# Patient Record
Sex: Female | Born: 1935 | Race: White | Hispanic: No | Smoking: Former smoker
Health system: Southern US, Community
[De-identification: ages and names within clinical notes are randomized; demographics above are authoritative.]

## PROBLEM LIST (undated history)

## (undated) DIAGNOSIS — I2510 Atherosclerotic heart disease of native coronary artery without angina pectoris: Secondary | ICD-10-CM

## (undated) DIAGNOSIS — E7800 Pure hypercholesterolemia, unspecified: Secondary | ICD-10-CM

## (undated) DIAGNOSIS — N189 Chronic kidney disease, unspecified: Secondary | ICD-10-CM

## (undated) DIAGNOSIS — M1990 Unspecified osteoarthritis, unspecified site: Secondary | ICD-10-CM

## (undated) DIAGNOSIS — I10 Essential (primary) hypertension: Secondary | ICD-10-CM

## (undated) DIAGNOSIS — D1779 Benign lipomatous neoplasm of other sites: Secondary | ICD-10-CM

## (undated) HISTORY — PX: ADRENALECTOMY: SHX876

## (undated) HISTORY — DX: Essential (primary) hypertension: I10

## (undated) HISTORY — PX: CYSTECTOMY: SUR359

## (undated) HISTORY — PX: TONSILLECTOMY: SHX5217

## (undated) HISTORY — DX: Unspecified osteoarthritis, unspecified site: M19.90

## (undated) HISTORY — DX: Pure hypercholesterolemia, unspecified: E78.00

## (undated) HISTORY — PX: BACK SURGERY: SHX140

## (undated) HISTORY — PX: TOTAL ABDOMINAL HYSTERECTOMY: SHX209

## (undated) HISTORY — PX: CATARACT EXTRACTION: SUR2

## (undated) HISTORY — PX: HEMORROIDECTOMY: SUR656

---

## 1998-12-31 ENCOUNTER — Encounter: Payer: Self-pay | Admitting: Specialist

## 1999-01-01 ENCOUNTER — Encounter: Payer: Self-pay | Admitting: Specialist

## 1999-01-01 ENCOUNTER — Observation Stay (HOSPITAL_COMMUNITY): Admission: RE | Admit: 1999-01-01 | Discharge: 1999-01-02 | Payer: Self-pay | Admitting: Specialist

## 1999-07-08 ENCOUNTER — Emergency Department (HOSPITAL_COMMUNITY): Admission: EM | Admit: 1999-07-08 | Discharge: 1999-07-08 | Payer: Self-pay | Admitting: *Deleted

## 2007-04-27 ENCOUNTER — Ambulatory Visit: Payer: Self-pay | Admitting: Vascular Surgery

## 2008-12-26 HISTORY — PX: CARDIOVASCULAR STRESS TEST: SHX262

## 2008-12-30 ENCOUNTER — Encounter: Admission: RE | Admit: 2008-12-30 | Discharge: 2008-12-30 | Payer: Self-pay | Admitting: General Surgery

## 2009-01-02 ENCOUNTER — Inpatient Hospital Stay (HOSPITAL_BASED_OUTPATIENT_CLINIC_OR_DEPARTMENT_OTHER): Admission: RE | Admit: 2009-01-02 | Discharge: 2009-01-02 | Payer: Self-pay | Admitting: Cardiovascular Disease

## 2009-01-02 HISTORY — PX: CARDIAC CATHETERIZATION: SHX172

## 2009-01-19 ENCOUNTER — Encounter: Payer: Self-pay | Admitting: Cardiology

## 2009-01-19 ENCOUNTER — Ambulatory Visit: Payer: Self-pay | Admitting: Vascular Surgery

## 2009-01-19 ENCOUNTER — Ambulatory Visit (HOSPITAL_COMMUNITY): Admission: RE | Admit: 2009-01-19 | Discharge: 2009-01-19 | Payer: Self-pay | Admitting: Cardiology

## 2009-02-10 ENCOUNTER — Encounter (INDEPENDENT_AMBULATORY_CARE_PROVIDER_SITE_OTHER): Payer: Self-pay | Admitting: General Surgery

## 2009-02-10 ENCOUNTER — Inpatient Hospital Stay (HOSPITAL_COMMUNITY): Admission: RE | Admit: 2009-02-10 | Discharge: 2009-02-16 | Payer: Self-pay | Admitting: General Surgery

## 2009-11-26 ENCOUNTER — Encounter (INDEPENDENT_AMBULATORY_CARE_PROVIDER_SITE_OTHER): Payer: Self-pay | Admitting: *Deleted

## 2009-11-30 ENCOUNTER — Encounter (INDEPENDENT_AMBULATORY_CARE_PROVIDER_SITE_OTHER): Payer: Self-pay | Admitting: *Deleted

## 2009-12-02 ENCOUNTER — Ambulatory Visit: Payer: Self-pay | Admitting: Internal Medicine

## 2009-12-14 ENCOUNTER — Ambulatory Visit: Payer: Self-pay | Admitting: Internal Medicine

## 2009-12-15 ENCOUNTER — Encounter: Payer: Self-pay | Admitting: Internal Medicine

## 2010-07-02 IMAGING — CT CT PELVIS W/ CM
2 of 5 series · 16 of 46 positions shown, 18 images · IV contrast (READICAT/WATER & [ID] OMNI 300)
Comparison: Abdominal pelvic CT done at [HOSPITAL] 12/24/2008.

CT ABDOMEN

CLINICAL DATA: Right upper quadrant abdominal pain for several
months with constipation.  Follow-up mass.

CT ABDOMEN AND PELVIS WITH CONTRAST
TECHNIQUE: Multidetector CT imaging of the abdomen and pelvis was
performed using the standard protocol following bolus
administration of intravenous contrast.
Contrast: 100 ml Qmnipaque-WZZ intravenously.  Oral contrast was
given.

[Series 3: routine abdomen · axial · 0.70mm/px · z∈[-483,-103]mm · 13 of 86 slices shown, 15 images]
[im 6/86  soft-tissue]
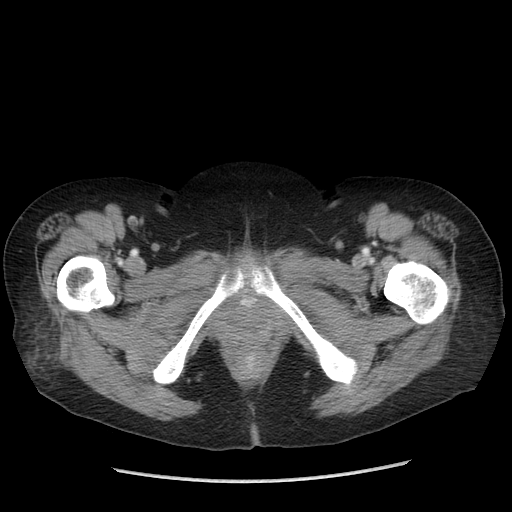
[im 6/86  bone]
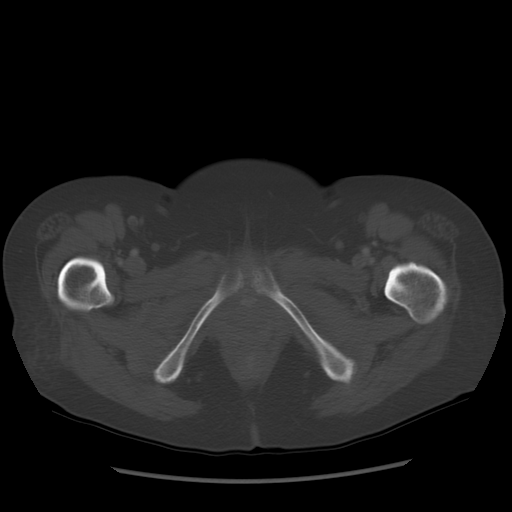
[im 11/86  soft-tissue]
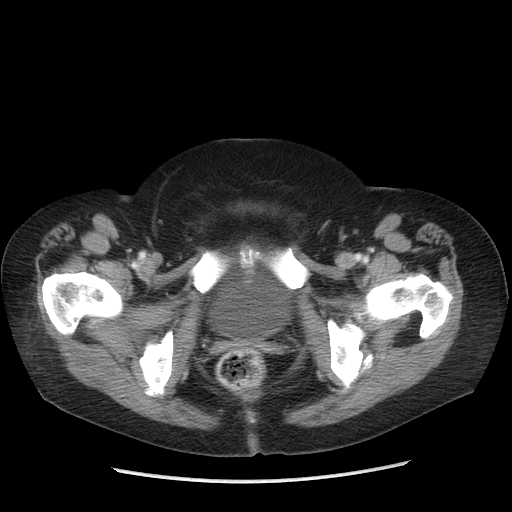
[im 21/86  soft-tissue]
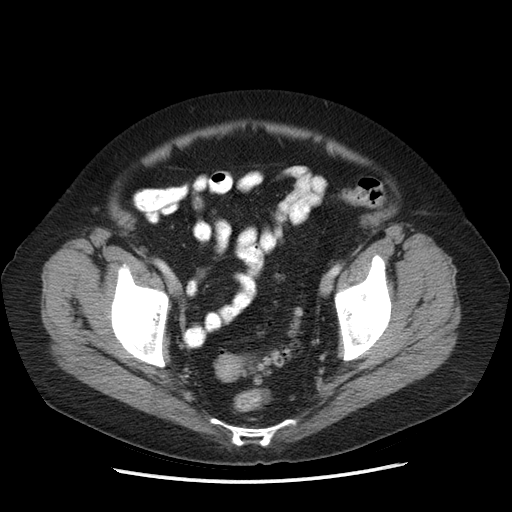
[im 26/86  soft-tissue]
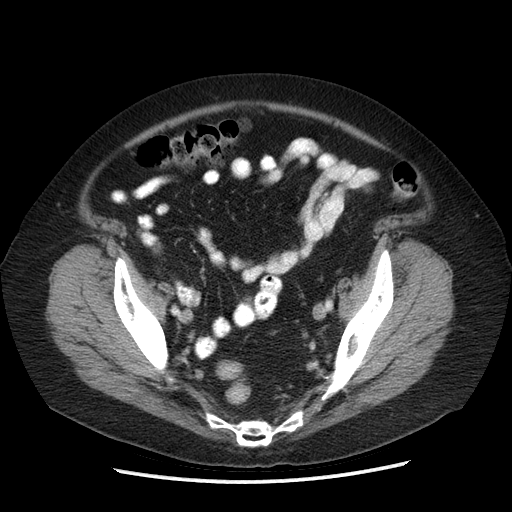
[im 31/86  soft-tissue]
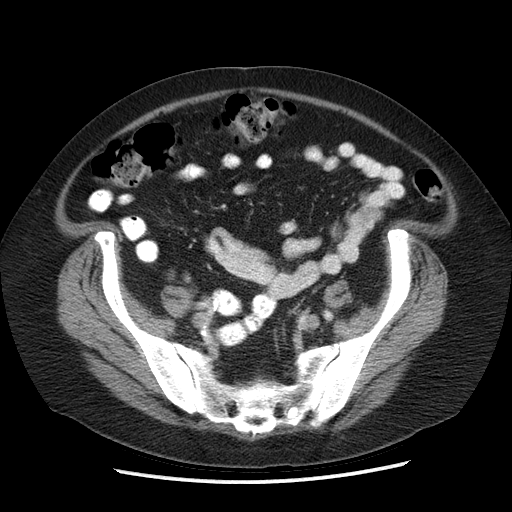
[im 36/86  soft-tissue]
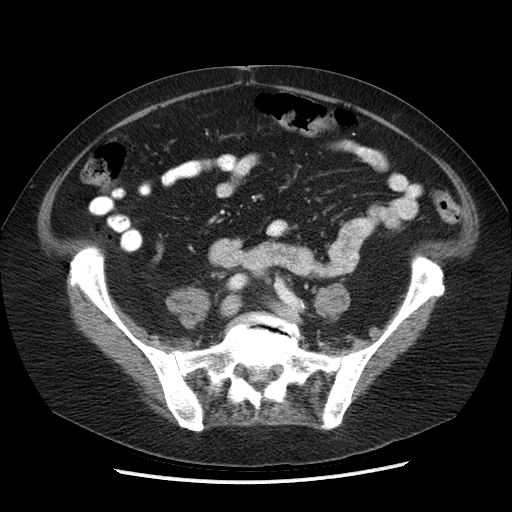
[im 46/86  soft-tissue]
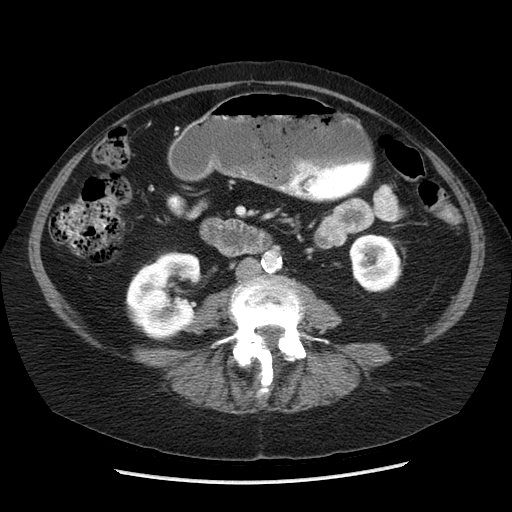
[im 51/86  soft-tissue]
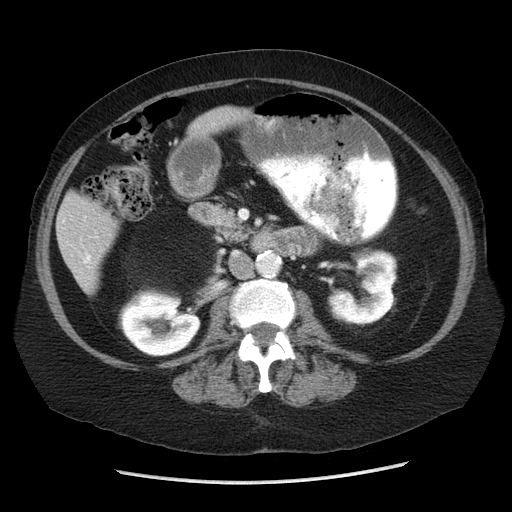
[im 56/86  soft-tissue]
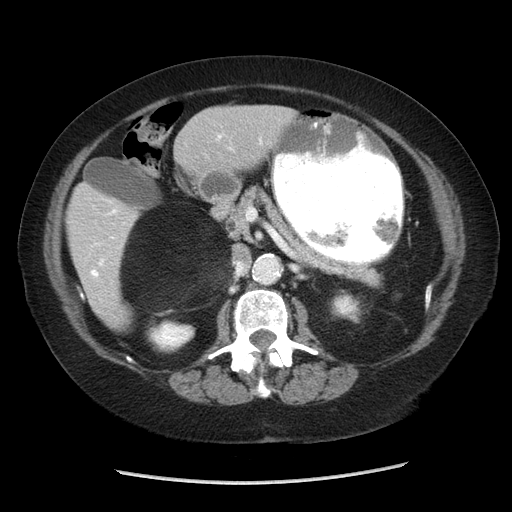
[im 56/86  bone]
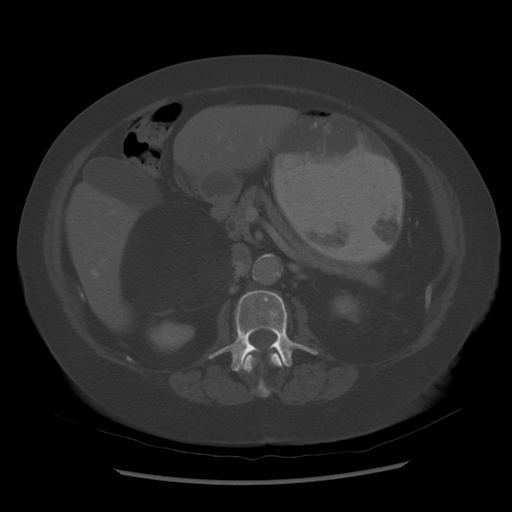
[im 61/86  soft-tissue]
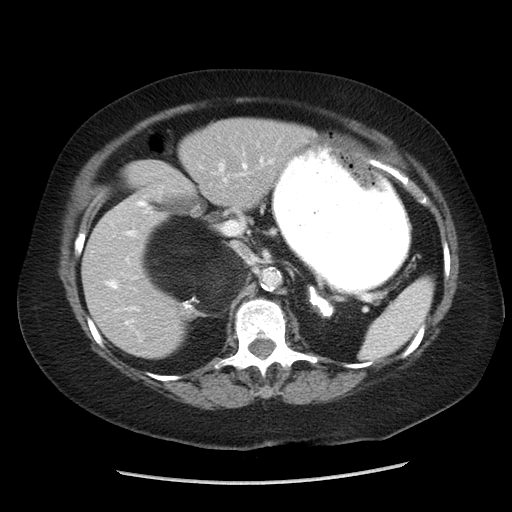
[im 66/86  soft-tissue]
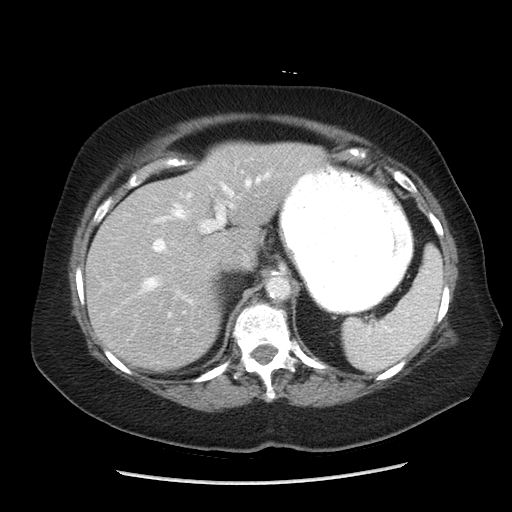
[im 76/86  soft-tissue]
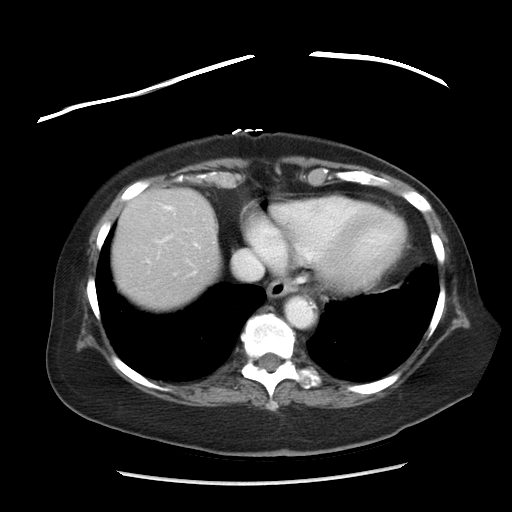
[im 81/86  soft-tissue]
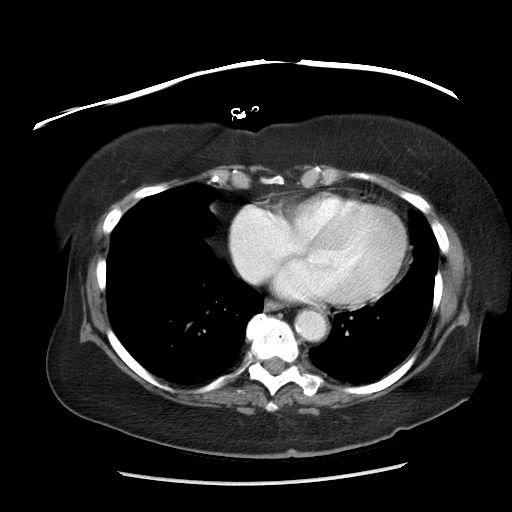

[Series 602: sagittal body · sagittal · 0.85mm/px · 3 of 145 slices shown]
[im 49/145  soft-tissue]
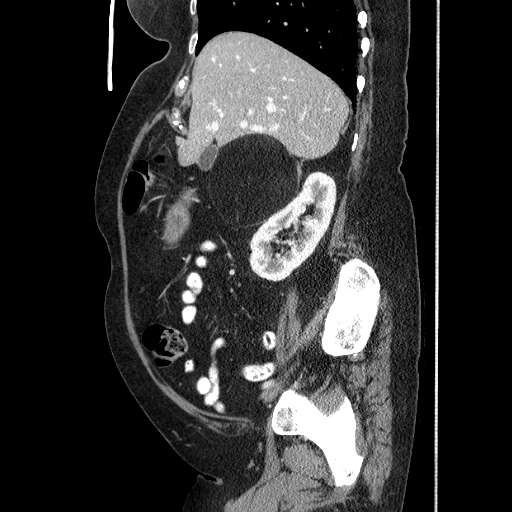
[im 65/145  soft-tissue]
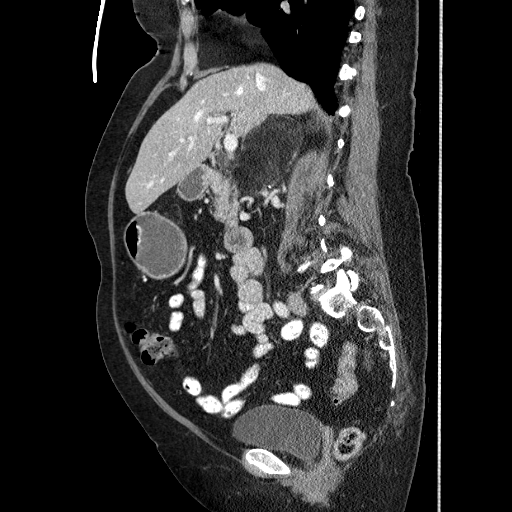
[im 81/145  soft-tissue]
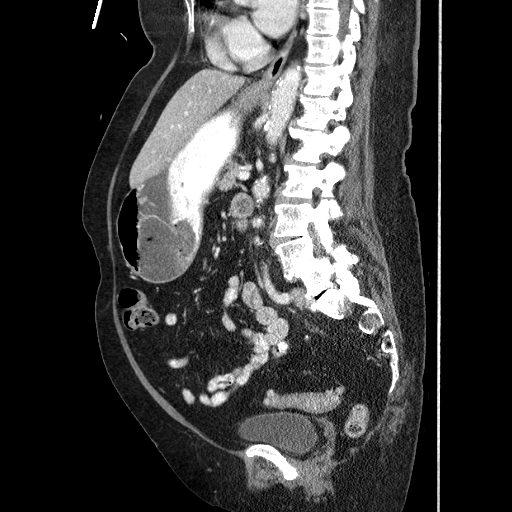

[16 of 46 positions shown; findings below may reference images not displayed]

FINDINGS: There is stable mild atelectasis or scarring in the
right lower lobe.  No significant pleural effusion is demonstrated.

A superior right retroperitoneal fatty mass measures 8.7 x 8.0 cm
transverse on image 32.  This lesion measures 8.6 cm cephalocaudad.
This displaces the right kidney inferolaterally and the pancreatic
head anteromedially.  This mass appears to be arising from the
right adrenal gland which is not seen separately and is probably
located along the superior lateral aspect of the fatty mass.  The
mass is largely fat density, although there are small
calcifications posteriorly, likely within the adrenal tissue.  The
left adrenal gland also demonstrates calcifications.  There is no
left adrenal mass.

The liver, spleen and gallbladder appear normal.  There is no
biliary or pancreatic ductal dilatation.  There is no pancreatic
mass or surrounding inflammatory change.  The retroperitoneal mass
exerts some mass effect on the IVC, but there is no evidence of IVC
thrombosis or occlusion.

There are small renal cortical cysts bilaterally, measuring up to
1.7 cm in diameter in the upper pole of the left kidney.  There is
no hydronephrosis or retroperitoneal adenopathy.  Diffuse aortic
atherosclerosis appears stable.
IMPRESSION: 1.  Stable large fatty mass in the upper right retroperitoneum,
likely arising from the right adrenal gland.  This is most
compatible with an adrenal myelolipoma.
2.  Bilateral adrenal calcifications compatible with prior
hemorrhage or granulomatous infection.
3.  Stable aortic atherosclerosis, renal cysts and lumbar
spondylosis.

CT PELVIS
FINDINGS: There is no pelvic mass, fluid collection or
inflammatory process.  Sigmoid colon diverticular changes are
present without surrounding inflammation.  The uterus is surgically
absent.  There is no adnexal mass or lymphadenopathy.

On review of the bone windows, advanced facet degenerative changes
are present bilaterally at L4-L5 accounting for 10 mm of
anterolisthesis of L4 on L5.  There is advanced disc space loss at
that level.  There is lesser disc space loss at L3-L4 and L5-S1.
There are probable changes of osteitis pubis.
IMPRESSION: 1.  No acute pelvic findings.
2.  Sigmoid diverticulosis.
3.  Stable severe lower lumbar spondylosis with grade II
degenerative anterolisthesis at L4-L5.

## 2010-09-16 ENCOUNTER — Ambulatory Visit: Payer: Self-pay | Admitting: Vascular Surgery

## 2010-09-28 ENCOUNTER — Encounter: Payer: Self-pay | Admitting: Vascular Surgery

## 2010-09-28 ENCOUNTER — Inpatient Hospital Stay (HOSPITAL_COMMUNITY): Admission: RE | Admit: 2010-09-28 | Discharge: 2010-09-29 | Payer: Self-pay | Admitting: Vascular Surgery

## 2010-10-14 ENCOUNTER — Ambulatory Visit: Payer: Self-pay | Admitting: Vascular Surgery

## 2010-12-07 NOTE — Letter (Signed)
Summary: Previsit letter  [facility]  [place], [place] [address]   Phone: [phone]  Fax: [phone]       11/26/2009 MRN: [id number]  Amy [name] [address] [place], [place]  [address]  Dear [name],  Welcome to the Gastroenterology Division at [facility].    You are scheduled to see a nurse for your pre-procedure visit on 12-02-09 at 1:30pm on the 3rd floor at [facility], [place] [employer].  We ask that you try to arrive at our office 15 minutes prior to your appointment time to allow for check-in.  Your nurse visit will consist of discussing your medical and surgical history, your immediate family medical history, and your medications.    Please bring a complete list of all your medications or, if you prefer, bring the medication bottles and we will list them.  We will need to be aware of both prescribed and over the counter drugs.  We will need to know exact dosage information as well.  If you are on blood thinners (Coumadin, Plavix, Aggrenox, Ticlid, etc.) please call our office today/prior to your appointment, as we need to consult with your physician about holding your medication.   Please be prepared to read and sign documents such as consent forms, a financial agreement, and acknowledgement forms.  If necessary, and with your consent, a friend or relative is welcome to sit-in on the nurse visit with you.  Please bring your insurance card so that we may make a copy of it.  If your insurance requires a referral to see a specialist, please bring your referral form from your primary care physician.  No co-pay is required for this nurse visit.     If you cannot keep your appointment, please call [phone] to cancel or reschedule prior to your appointment date.  This allows [place] the opportunity to schedule an appointment for another patient in need of care.    Thank you for choosing [facility] for your medical needs.  We  appreciate the opportunity to care for you.  Please visit [place] at our website  to learn more about our practice.                     Sincerely.                                                                                                                   The Gastroenterology Division

## 2010-12-07 NOTE — Miscellaneous (Signed)
Summary: [facility] Previsit/prep  Clinical Lists Changes  Medications: Added new medication of MOVIPREP 100 GM  SOLR (PEG-KCL-NACL-NASULF-NA ASC-C) As per prep instructions. - Signed Rx of MOVIPREP 100 GM  SOLR (PEG-KCL-NACL-NASULF-NA ASC-C) As per prep instructions.;  #1 x 0;  Signed;  Entered by: Emerson Monte RN;  Authorized by: Irene Shipper MD;  Method used: Electronically to [facility] [address], [place], [place], [place]  [address], Ph: [phone], Fax: [phone] Allergies: Added new allergy or adverse reaction of SULFA Added new allergy or adverse reaction of CODEINE Observations: Added new observation of NKA: F (12/02/2009 13:17)    Prescriptions: MOVIPREP 100 GM  SOLR (PEG-KCL-NACL-NASULF-NA ASC-C) As per prep instructions.  #1 x 0   Entered by:   Emerson Monte RN   Authorized by:   Irene Shipper MD   Signed by:   Emerson Monte RN on 12/02/2009   Method used:   Electronically to        [facility] (retail)       [address]       [place],   [address]       Ph: [phone]       Fax: [phone]   RxID:   [id number]

## 2010-12-07 NOTE — Letter (Signed)
Summary: Patient Notice- Polyp Results  [facility]  [address] [place], [place] [address]   Phone: [phone]  Fax: [phone]        December 15, 2009 MRN: [id number]    [place], [place]  [address]    Dear [name],  I am pleased to inform you that the colon polyp(s) removed during your recent colonoscopy was (were) found to be benign (no cancer detected) upon pathologic examination.  I recommend you have a repeat colonoscopy examination in 5 years to look for recurrent polyps, as having colon polyps increases your risk for having recurrent polyps or even colon cancer in the future.  Should you develop new or worsening symptoms of abdominal pain, bowel habit changes or bleeding from the rectum or bowels, please schedule an evaluation with either your primary care physician or with me.  Additional information/recommendations:  __ No further action with gastroenterology is needed at this time. Please      follow-up with your primary care physician for your other healthcare      needs.   Please call us if you are having persistent problems or have questions about your condition that have not been fully answered at this time.  Sincerely,  Irene Shipper MD  This letter has been electronically signed by your physician.  Appended Document: Patient Notice- Polyp Results Letter mailed 2.10.11

## 2010-12-07 NOTE — Letter (Signed)
Summary: [facility] Instructions  [facility]  [place],  [address]   Phone: [phone]  Fax: [phone]       ADREONA DEVARY    [birth year]    MRN: [id number]        Procedure Day Sudie Grumbling:  Adelfa Koh  12/14/09     Arrival Time:  10:30AM     Procedure Time:  11:30AM     Location of Procedure:                    _X _  [place] (4th Floor)                        [place]   Starting 5 days prior to your procedure 12/09/09 do not eat nuts, seeds, popcorn, corn, beans, peas,  salads, or any raw vegetables.  Do not take any fiber supplements (e.g. Metamucil, Citrucel, and Benefiber).  THE DAY BEFORE YOUR PROCEDURE         DATE: 12/13/09  DAY: SUNDAY  1.  Drink clear liquids the entire day-NO SOLID FOOD  2.  Do not drink anything colored red or purple.  Avoid juices with pulp.  No orange juice.  3.  Drink at least 64 oz. (8 glasses) of fluid/clear liquids during the day to prevent dehydration and help the prep work efficiently.  CLEAR LIQUIDS INCLUDE: Water Jello Ice Popsicles Tea (sugar ok, no milk/cream) Powdered fruit flavored drinks Coffee (sugar ok, no milk/cream) Gatorade Juice: apple, white grape, white cranberry  Lemonade Clear bullion, consomm, broth Carbonated beverages (any kind) Strained chicken noodle soup Hard Candy                             4.  In the morning, mix first dose of MoviPrep solution:    Empty 1 Pouch A and 1 Pouch B into the disposable container    Add lukewarm drinking water to the top line of the container. Mix to dissolve    Refrigerate (mixed solution should be used within 24 hrs)  5.  Begin drinking the prep at 5:00 p.m. The MoviPrep container is divided by 4 marks.   Every 15 minutes drink the solution down to the next mark (approximately 8 oz) until the full liter is complete.   6.  Follow completed prep with 16 oz of clear liquid of your choice (Nothing  red or purple).  Continue to drink clear liquids until bedtime.  7.  Before going to bed, mix second dose of MoviPrep solution:    Empty 1 Pouch A and 1 Pouch B into the disposable container    Add lukewarm drinking water to the top line of the container. Mix to dissolve    Refrigerate  THE DAY OF YOUR PROCEDURE      DATE: 12/14/09 [id number]  Beginning at 73:30a.m. (5 hours before procedure):         1. Every 15 minutes, drink the solution down to the next mark (approx 8 oz) until the full liter is complete.  2. Follow completed prep with 16 oz. of clear liquid of your choice.    3. You may drink clear liquids until 9:30AM (2 HOURS BEFORE PROCEDURE).   MEDICATION INSTRUCTIONS  Unless otherwise instructed, you should take regular prescription medications with a small sip of water   as early as possible the morning of your  procedure.    Additional medication instructions:  Hold Benicar the morning of procedure.  Take your heart and blood pressure medicine the morning of procedure.         OTHER INSTRUCTIONS  You will need a responsible adult at least 73 years of age to accompany you and drive you home.   This person must remain in the waiting room during your procedure.  Wear loose fitting clothing that is easily removed.  Leave jewelry and other valuables at home.  However, you may wish to bring a book to read or  an iPod/MP3 player to listen to music as you wait for your procedure to start.  Remove all body piercing jewelry and leave at home.  Total time from sign-in until discharge is approximately 2-3 hours.  You should go home directly after your procedure and rest.  You can resume normal activities the  day after your procedure.  The day of your procedure you should not:   Drive   Make legal decisions   Operate machinery   Drink alcohol   Return to work  You will receive specific instructions about eating, activities and medications before you  leave.    The above instructions have been reviewed and explained to me by   Emerson Monte RN  December 02, 2009 2:04 PM     I fully understand and can verbalize these instructions _____________________________ Date _________

## 2010-12-07 NOTE — Procedures (Signed)
Summary: Colonoscopy  Patient: Amy [name] Note: All result statuses are Final unless otherwise noted.  Tests: (1) Colonoscopy (COL)   COL Colonoscopy           [facility] [employer]     [place], [place]  [address]           COLONOSCOPY PROCEDURE REPORT           PATIENT:  Amy [name], Amy [name]  MR#:  [id number]     BIRTHDATE:  1936/05/07, 73 yrs. old  GENDER:  female           ENDOSCOPIST:  Docia Chuck. Geri Seminole, MD     Referred by:  Burnard Bunting, M.D.           PROCEDURE DATE:  12/14/2009     PROCEDURE:  Colonoscopy with snare polypectomy     ASA CLASS:  Class II     INDICATIONS:  Routine Risk Screening           MEDICATIONS:   Fentanyl 75 mcg IV, Versed 8 mg IV           DESCRIPTION OF PROCEDURE:   After the risks benefits and     alternatives of the procedure were thoroughly explained, informed     consent was obtained.  Digital rectal exam was performed and     revealed no abnormalities.   The LB CF-[id number] endoscope     was introduced through the anus and advanced to the cecum, which     was identified by both the appendix and ileocecal valve, without     limitations. TIME TO CECUM = 5:40 MIN.The quality of the prep was     excellent, using MoviPrep.  The instrument was then slowly     withdrawn (TIME = 6:30 MIN) as the colon was fully examined.     <<PROCEDUREIMAGES>>           FINDINGS:  A 30mm pedunculated polyp was found in the sigmoid     colon. Polyp was snared, then cauterized with monopolar cautery.     Retrieval was successful.   Moderate diverticulosis was found in     the left colon.   Retroflexed views in the rectum revealed no     abnormalities.    The scope was then withdrawn from the patient     and the procedure completed.           COMPLICATIONS:  None           ENDOSCOPIC IMPRESSION:     1) Pedunculated polyp in the sigmoid colon - removed     2) Moderate diverticulosis in the left colon           RECOMMENDATIONS:  1) Repeat colonoscopy in 5 years if polyp adenomatous; otherwise     prn           ______________________________     Docia Chuck. Geri Seminole, MD           CC:  Burnard Bunting, MD;The Patient           n.     Lorrin MaisDocia Chuck. Geri Seminole at 12/14/2009 12:41 PM           Caya, Inez Catalina, [id number]  Note: An exclamation mark (!) indicates a result that was not dispersed into the flowsheet. Document Creation Date: 12/14/2009 12:41 PM _______________________________________________________________________  (1) Order result status: Final Collection  or observation date-time: 12/14/2009 12:35 Requested date-time:  Receipt date-time:  Reported date-time:  Referring Physician:   Ordering Physician: Lavena Bullion [phone]) Specimen Source:  Source: Tawanna Cooler Order Number: [id number] Lab site:   Appended Document: Colonoscopy recall     Procedures Next Due Date:    Colonoscopy: 12/2014

## 2011-01-18 LAB — TYPE AND SCREEN: Antibody Screen: NEGATIVE

## 2011-01-18 LAB — CBC
HCT: 35.1 % — ABNORMAL LOW (ref 36.0–46.0)
Hemoglobin: 11.5 g/dL — ABNORMAL LOW (ref 12.0–15.0)
MCH: 30.3 pg (ref 26.0–34.0)
MCHC: 32.8 g/dL (ref 30.0–36.0)
MCHC: 33.4 g/dL (ref 30.0–36.0)
MCV: 92.4 fL (ref 78.0–100.0)
Platelets: 179 10*3/uL (ref 150–400)
RDW: 13 % (ref 11.5–15.5)
RDW: 13.2 % (ref 11.5–15.5)

## 2011-01-18 LAB — BASIC METABOLIC PANEL
BUN: 10 mg/dL (ref 6–23)
CO2: 30 mEq/L (ref 19–32)
GFR calc non Af Amer: >60 mL/min (ref >60)
Glucose, Bld: 144 mg/dL — ABNORMAL HIGH (ref 70–99)
Potassium: 3.8 mEq/L (ref 3.5–5.1)

## 2011-01-18 LAB — URINALYSIS, ROUTINE W REFLEX MICROSCOPIC
Glucose, UA: NEGATIVE mg/dL
Nitrite: NEGATIVE
Protein, ur: NEGATIVE mg/dL
pH: 5.5 (ref 5.0–8.0)

## 2011-01-18 LAB — SURGICAL PCR SCREEN: Staphylococcus aureus: NEGATIVE

## 2011-01-18 LAB — APTT: aPTT: 29 seconds (ref 24–37)

## 2011-01-18 LAB — COMPREHENSIVE METABOLIC PANEL
ALT: 18 U/L (ref 0–35)
AST: 19 U/L (ref 0–37)
Calcium: 9.5 mg/dL (ref 8.4–10.5)
GFR calc Af Amer: >60 mL/min (ref >60)
Sodium: 134 mEq/L — ABNORMAL LOW (ref 135–145)
Total Protein: 6.9 g/dL (ref 6.0–8.3)

## 2011-01-18 LAB — PROTIME-INR: Prothrombin Time: 12.3 seconds (ref 11.6–15.2)

## 2011-02-16 LAB — CROSSMATCH: Antibody Screen: NEGATIVE

## 2011-02-16 LAB — BASIC METABOLIC PANEL
BUN: 7 mg/dL (ref 6–23)
CO2: 32 mEq/L (ref 19–32)
Chloride: 96 mEq/L (ref 96–112)
Creatinine, Ser: 0.6 mg/dL (ref 0.4–1.2)
GFR calc Af Amer: >60 mL/min (ref >60)
GFR calc Af Amer: >60 mL/min (ref >60)
GFR calc non Af Amer: >60 mL/min (ref >60)
Potassium: 4 mEq/L (ref 3.5–5.1)
Sodium: 134 mEq/L — ABNORMAL LOW (ref 135–145)

## 2011-02-16 LAB — COMPREHENSIVE METABOLIC PANEL
ALT: 20 U/L (ref 0–35)
AST: 23 U/L (ref 0–37)
Albumin: 3.6 g/dL (ref 3.5–5.2)
Calcium: 9.3 mg/dL (ref 8.4–10.5)
Creatinine, Ser: 0.67 mg/dL (ref 0.4–1.2)
GFR calc Af Amer: >60 mL/min (ref >60)
GFR calc non Af Amer: >60 mL/min (ref >60)
Sodium: 136 mEq/L (ref 135–145)
Total Protein: 6.2 g/dL (ref 6.0–8.3)

## 2011-02-16 LAB — DIFFERENTIAL
Eosinophils Absolute: 0.1 10*3/uL (ref 0.0–0.7)
Eosinophils Relative: 1 % (ref 0–5)
Lymphocytes Relative: 23 % (ref 12–46)
Lymphs Abs: 1.7 10*3/uL (ref 0.7–4.0)
Monocytes Relative: 9 % (ref 3–12)

## 2011-02-16 LAB — CBC
HCT: 37.1 % (ref 36.0–46.0)
Hemoglobin: 12.7 g/dL (ref 12.0–15.0)
MCHC: 34 g/dL (ref 30.0–36.0)
MCV: 94.2 fL (ref 78.0–100.0)
MCV: 94.2 fL (ref 78.0–100.0)
Platelets: 141 10*3/uL — ABNORMAL LOW (ref 150–400)
Platelets: 163 10*3/uL (ref 150–400)
RBC: 3.93 MIL/uL (ref 3.87–5.11)
RBC: 4.32 MIL/uL (ref 3.87–5.11)
RBC: 4.41 MIL/uL (ref 3.87–5.11)
RDW: 14.3 % (ref 11.5–15.5)
WBC: 13.2 10*3/uL — ABNORMAL HIGH (ref 4.0–10.5)
WBC: 7.3 10*3/uL (ref 4.0–10.5)

## 2011-02-16 LAB — PROTIME-INR
INR: 0.9 (ref 0.00–1.49)
Prothrombin Time: 12.3 seconds (ref 11.6–15.2)

## 2011-02-16 LAB — ABO/RH: ABO/RH(D): AB POS

## 2011-03-22 NOTE — Assessment & Plan Note (Signed)
OFFICE VISIT   DESMOND, SCHUKNECHT  DOB:  1936/09/17                                       10/14/2010  [id number]   The patient returns for followup today after her left carotid  endarterectomy on November 22nd.  She had an uneventful postoperative  course and an uneventful recovery at home.  She returns today for  further followup.  She denies any symptoms of TIA, amaurosis, or stroke.  She states that she is swallowing well.   On physical exam today, blood pressure is 186/96 in the right arm,  169/102 in the left arm, heart rate is 72 and regular.  She has an  audible right-sided carotid bruit.  The left neck incision is well-  healed without bruit.  Neurologic exam shows symmetric upper extremity  and lower extremity motor strength with midline tongue.   I encouraged the patient to continue to take 1 aspirin daily.  She has  recovered well from her operation.  She will follow up with [place] in 6  months' time for repeat carotid duplex exam.     Jessy Oto. Fields, MD  Electronically Signed   CEF/MEDQ  D:  10/14/2010  T:  10/15/2010  Job:  [id number]   cc:   Burnard Bunting, M.D.  Ezzard Standing, M.D.

## 2011-03-22 NOTE — Assessment & Plan Note (Signed)
OFFICE VISIT   [name], [name]  DOB:  [birth year]                                       09/16/2010  [id number]   CHIEF COMPLAINT:  Carotid stenosis.   HISTORY OF PRESENT ILLNESS:  Amy [name] is a 75 year old female referred  by Dr. Reynaldo [name] for evaluation of carotid stenosis.  She was noted to  have a left carotid bruit on routine physical exam.  This was then  further evaluated with a carotid duplex scan which showed a high-grade  stenosis.  The patient stated that she had several weakness spells  with numbness in both hands and a tremor.  She did not really have a  focal deficit.  She denies any symptoms of amaurosis or prior stroke.  She has had some occasional dizziness spells in the past but states that  these were related to a middle ear problem.  She had a CT scan of the  head approximately 1 month ago which she said was negative.  These  episodes of weakness have sometimes been followed with pain across her  shoulders and radiating down in both arms.  She did not really describe  classic angina but this could be an angina equivalent.  She stated that  this occurred at rest or with exercise.  She denied any history of  myocardial infarction.  She has been seen by Dr. Tollie [name] in the  past for cardiology evaluation.   Chronic medical problems include hypertension, elevated cholesterol, and  chronic back pain.  These are all followed by Dr. Reynaldo [name] and currently  stable.   PAST SURGICAL HISTORY:  Hemorrhoidectomy, hysterectomy, tumor removed  from the dorsal surface of the right hand, cataract removal, right  adrenalectomy, and 3 back operations.   SOCIAL HISTORY:  She is widowed.  She has 2 children.  She is a former  smoker but quit greater than 20 years ago.  She does not consume alcohol  regularly.   FAMILY HISTORY:  Remarkable for her father who died of a myocardial  infarction at age 52 and an uncle who died of vascular disease  at age  less than 67.   REVIEW OF SYSTEMS:  Full 12 point review of systems was performed on the  patient today.  Please see intake referral form for details regarding  this.   MEDICATIONS:  Include aspirin 81 mg once a day, Benicar HCT 40/12.5 once  a day, Corgard 40 mg once daily, Imdur 60 mg XR 24-hour tablet one half  to 1 tablet every morning, nitroglycerin p.r.n.   ALLERGIES:  She has an allergy listed as sulfa drugs which caused hives.   PHYSICAL EXAMINATION:  Blood pressure is 169/69 in the right arm, 147/79  in the left arm, oxygen saturation is 97%, heart rate 66 and regular.  HEENT:  Unremarkable.  NECK:  Has 2+ carotid pulses.  She has bilateral carotid bruits.  CHEST:  Clear to auscultation.  CARDIAC:  Regular rate and rhythm with a systolic ejection murmur heard  best at the right second interspace.  ABDOMEN:  Soft, nontender, nondistended with a well-healed right upper  quadrant scar.  She is slightly obese.  EXTREMITIES:  She has a 2+ radial pulse on the right.  She has 2+  brachial pulses bilaterally.  She has a 1+ left radial pulse.  She has  2+ femoral pulses bilaterally.  She has a 1+ dorsalis pedis pulse.  She  has absent popliteal pulses bilaterally.  She has absent pedal pulses in  the right foot.  SKIN:  Pink and warm, well-perfused with no ulcerations or rashes.  NEUROLOGIC:  Exam shows symmetric upper extremity and lower extremity  motor strength which is 5/5.   I reviewed her carotid duplex exam from [facility] dated September 14, 2010.  This shows a high-grade left internal carotid artery stenosis  with peak systolic velocity of 123456 cm/sec with a diastolic velocity of  123XX123 cm/sec.  She had an ICA:CCA ratio of 6.5.  The right side had the  moderate stenosis with a peak systolic velocity of 99991111 cm/sec.   I had a lengthy discussion with Ms. Brecker today that she is at risk of  stroke with this high-grade stenosis.  I believe she would benefit from   a carotid endarterectomy.  Risks, benefits, possible complications and  procedure details including not limited to bleeding, infection, stroke,  and cranial nerve injury were explained to the patient today.  She  understands and agrees to proceed.  We will schedule her with an  appointment with Dr. Wynonia [name] for cardiac risk stratification  prior to  her procedure.  Her carotid endarterectomy is scheduled for September 28, 2010.     Amy Oto. Fields, MD  Electronically Signed   CEF/MEDQ  D:  09/16/2010  T:  09/17/2010  Job:  [id number]   cc:   Amy [name], M.D.

## 2011-03-22 NOTE — H&P (Signed)
Amy [name], Amy [name] NO.:  [id number]   MEDICAL RECORD NO.:  [id number]          PATIENT TYPE:  INP   LOCATION:  [id number]                         FACILITY:  [facility]   PHYSICIAN:  Odis Hollingshead, M.D.DATE OF BIRTH:  1936/10/05   DATE OF ADMISSION:  02/10/2009  DATE OF DISCHARGE:                              HISTORY & PHYSICAL   REASON:  Resection of right upper quadrant intra-abdominal mass.   HISTORY:  This is a 75 year old female who was having some discomfort in  the right upper quadrant region with respect to right costal margin pain  and a little bit of dyspnea.  A CT scan of the chest demonstrated a  large right upper quadrant intra-abdominal mass in the infrahepatic  region.  Subsequent CT scan of the abdomen demonstrated a 9 x 8 cm mass  in the right upper quadrant region.  It appeared to be coming from the  right adrenal gland.  She has had biochemical studies performed, which  demonstrated this is not a chemically secreting tumor.  She now presents  for excision.   PAST MEDICAL HISTORY:  1. Cardiac dysrhythmia.  2. Hypertension.  3. Hypercholesterolemia.  4. Arthritis.  5. Chronic pain.   PREVIOUS OPERATIONS:  1. Hemorrhoidectomy.  2. Hysterectomy.  3. Multiple back surgeries.   ALLERGIES:  SULFA drugs.   CURRENT MEDICATIONS:  Nadolol, simvastatin, Benicar/hydrochlorothiazide,  isosorbide, diclofenac, Darvocet, aspirin, nitroglycerin p.r.n.,  multiple vitamin, vitamin E, vitamin B12, zinc.   SOCIAL HISTORY:  Single.  No tobacco or alcohol use.   FAMILY HISTORY:  Notable for heart disease and diabetes.   PHYSICAL EXAMINATION:  GENERAL:  A well-developed, well-nourished  female, who is no acute distress, pleasant and cooperative.  VITAL SIGNS:  Temperature is 98 degrees, blood pressure is 176/76, pulse  71, O2 saturation 99% on room air.  NECK:  Supple without masses.  RESPIRATORY:  Breath sounds equal and clear.  Respirations unlabored.  CARDIOVASCULAR:  Regular rate and regular rhythm.  No murmur.  No JVD.  ABDOMEN:  Soft.  Mild right upper quadrant tenderness with well-healed  lower midline scar.  No palpable masses or organomegaly.  MUSCULOSKELETAL:  No edema.  Good range of motion.   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   PLAN:  Exploratory laparotomy, resection of tumor.  We have discussed  the procedure and risks preoperatively.      Odis Hollingshead, M.D.  Electronically Signed     TJR/MEDQ  D:  02/10/2009  T:  02/11/2009  Job:  [id number]

## 2011-03-22 NOTE — Cardiovascular Report (Signed)
Amy [name], REUBER NO.:  [id number]   MEDICAL RECORD NO.:  [id number]          PATIENT TYPE:  OIB   LOCATION:  1961                         FACILITY:  [facility]   PHYSICIAN:  Thayer Headings, M.D. DATE OF BIRTH:  May 07, 1936   DATE OF PROCEDURE:  01/02/2009  DATE OF DISCHARGE:  01/02/2009                            CARDIAC CATHETERIZATION   Maddi Kobes is a 75 year old female with a history of hypertension,  hypercholesterolemia, shortness of breath, and dyspnea.  She has a long  history of cigarette smoking, but stopped last year.  She recently was  seen by Dr. Mare Ferrari for a preoperative Cardiolite scan after being  found to have an adrenal mass.  The Cardiolite scan revealed anterior  wall ischemia and she was referred for heart catheterization.   The patient has had some chronic shortness of breath and she had some  mild episodes of chest discomfort.  These episodes of shortness of  breath and chest pain occur at various times and are not necessarily  associated with eating, drinking, transposition, or taking a deep  breath.  Her episodes of chest pain are fairly atypical.  She has had  some abdominal pain and the workup of that revealed an adrenal mass.   PROCEDURE:  Left heart catheterization with coronary angiography.  The  right femoral artery was easily cannulated using modified Seldinger  technique.   HEMODYNAMIC RESULTS:  LV pressures 200/20 with an aortic pressure of  199/71.   ANGIOGRAPHY:  Left main:  The left main is fairly short, but is  otherwise normal.   The left anterior descending artery is moderate in size.  There is an  eccentric 60-70% proximal stenosis followed by 50-60% mid stenosis.  The  distal LAD has only minor luminal irregularities.   She has a very tiny first diagonal artery that has an 80-90% stenosis at  its takeoff.  This vessel is small and she is not a suitable candidate  for angioplasty or stenting.  The second  diagonal artery is still very  small, but is perhaps a little bit bigger than the first diagonal  artery.  It also has an 80-90% stenosis in the proximal segment.  This  stenosis is not at the ostium, but the vessel itself is still too small  for angioplasty or stenting.   The left circumflex artery is large and dominant.  It gives off obtuse  marginal artery that quickly branches into 2 moderate size branches.  There is a 70-80% stenosis at the takeoff of this first OM.  This lesion  appears to be somewhat calcified.   The remaining circumflex artery has only minor luminal irregularities.  The posterior descending artery is fairly normal.   The right coronary artery is small and is nondominant.  There is an 80-  90% stenosis in the proximal/mid segment of this vessel.  This supplies  only very small RV marginal branches only.   The left ventriculogram was performed in a 30 RAO position.  It reveals  hyperdynamic left ventricle.  Ejection fraction is around 75-80%.  There  is a  spade shaped contractility pattern consistent with hypertensive  heart disease.   COMPLICATIONS:  None.   CONCLUSION:  1. Diffuse coronary artery disease.  She has an anterior wall defect      on her Cardiolite study.  She has a moderate to severe stenosis in      the LAD, but I am not really sure that this is ischemia.  The      anterior defect could have been a breast defect.  She does have      significant coronary artery disease, but in very small branch      arteries.  Her first and second diagonal arteries have tight      stenosis, but these vessels were clearly too small for angioplasty      and stenting and are probably too small for bypass grafting.  The      LAD itself is a good target for bypass surgery.  We also could put      a stent, although it would require two fairly long stents.  I am      not sure that, that would be the best course of action.   She also has a significant obtuse  marginal stenosis that because of its  location right at the takeoff of the first OM with not be a good place  for stenting.  The circumflex artery is quite large in that segment and  that would be a significant mismatch between the true circumflex and  obtuse marginal artery.  Placement of a stent right at the ostium of  this first OM would be quite difficult.  Balloon angioplasty could be  attempted, but I would not expect very good results with that.   The right coronary artery is relatively small and is probably not a good  target for bypass surgery.   My recommendation would be that we continue with medical management.  We  will review the Cardiolite study and review the films again.  We will  continue with medical management.  If she fails medical management, she  will probably need bypass surgery.  I think that this would be the best  way to revascularize the LAD and the obtuse marginal vessel.  I do not  know the nature of her adrenal mass.  It hopefully can wait sometimes,  so we can work through the coronaries.   If the adrenal mass turns out to be a malignant tumor or a serious  issue, she should be able to get through surgery with only moderate  risk.      Thayer Headings, M.D.  Electronically Signed     PJN/MEDQ  D:  01/02/2009  T:  01/02/2009  Job:  [id number]   cc:   Darlin Coco, M.D.  Burnard Bunting, M.D.  [facility]

## 2011-03-22 NOTE — Op Note (Signed)
Amy [name], LEDER NO.:  [id number]   MEDICAL RECORD NO.:  [id number]          PATIENT TYPE:  INP   LOCATION:  [id number]                         FACILITY:  [facility]   PHYSICIAN:  Odis Hollingshead, M.D.DATE OF BIRTH:  1936-02-10   DATE OF PROCEDURE:  02/10/2009  DATE OF DISCHARGE:                               OPERATIVE REPORT   PREOPERATIVE DIAGNOSIS:  Right upper quadrant intraabdominal mass.   POSTOPERATIVE DIAGNOSIS:  Right adrenal tumor.   PROCEDURE NOTE:  Exploratory laparotomy with right adrenalectomy and  resection of tumor.   SURGEON:  Odis Hollingshead, MD   ASSISTANT:  Edsel Petrin. Dalbert Batman, MD   ANESTHESIA:  General.   INDICATIONS:  A 75 year old female was having some right costal margin  pain and dyspnea and noted to have a right upper quadrant soft tissue  mass which appears to be coming from the adrenal gland.  It is not bile  chemically active.  She now presents for resection.   TECHNIQUE:  She was brought to the operating room and placed supine on  the operating table and general anesthetic was administered.  The  abdominal wall was sterilely prepped and draped.  A right subcostal  incision was made through the skin, subcutaneous tissue, fascia, muscle  layers, and peritoneum entering the peritoneal cavity.  Exploration  demonstrated the right upper quadrant tumor in the infrahepatic region.  No liver lesions were noted.  The gallbladder was soft without stones.  The tumor was not invading the intestine.   A self-retaining retractor was used for exposure.  Beginning inferiorly,  I began dissecting the tumor free from retroperitoneal structures using  electrocautery and harmonic scalpel and extended this up laterally  toward the superior aspect of the tumor mobilizing it free from the  retroperitoneal structures including the kidney.  A part of Gerota  fascia was excised with this as it was adherent to the tumor.   Following this, I then  noted some thin attachments from the adhesions  between the tumor and duodenum which were divided mobilizing it free  from the duodenum.  It was not directly invading the duodenum.  This  brought me down to the vena cava.  The tumor went posterior to the vena  cava and  there was a small side branch from, the right gonadal vein  draining the tumor. I ended up ligating and dividing the right gonadal  vessel as there was bleeding from it.  Following this, I rotated the  tumor to the right and was able to expose the adrenal gland and tumor  appeared to be emanating from that.  I identified the left renal vein  and divided it between clips.  I then carefully dissected the tumor free  from the inferior vena cava.  Some of the fibrofatty tissue next to the  vena caval was clipped and the rest was divided with a harmonic scalpel.  I then dissected the superior aspect of tumor free from the  retroperitoneal fat using the harmonic scalpel.  This completely freed  the tumor and the adrenal gland and this was  sent fresh to pathology.   Following this, I put dry laparotomy packs in the resection bed.  I then  copiously irrigated out the area with electrocautery and small bleeding  points from the Gerota fascia fat.  Some of the other retroperitoneal  femoral was controlled with electrocautery.  I then placed FloSeal over  the resection bed area.  At this point, hemostasis was adequate.   The self-retaining retractor was then removed.  The abdominal contents  were fell back into the right upper quadrant region.  I then closed the  fascia in 2 layers closing the posterior and anterior fascia with  running #1 PDS sutures.  Needle, sponge, and instrument counts were  reported to be correct.  Subcutaneous tissue was irrigated and skin was  closed with staples.  A sterile dressing was applied.   She tolerated the procedure without any apparent complications and was  taken to the recovery room in  satisfactory condition.      Odis Hollingshead, M.D.  Electronically Signed     TJR/MEDQ  D:  02/10/2009  T:  02/11/2009  Job:  [id number]   cc:   Burnard Bunting, M.D.  Darlin Coco, M.D.

## 2011-03-22 NOTE — H&P (Signed)
NAMEJOLEY, GHALI NO.:  [id number]   MEDICAL RECORD NO.:  [id number]          PATIENT TYPE:  OUT   LOCATION:                               FACILITY:  [facility]   PHYSICIAN:  Thayer Headings, M.D. DATE OF BIRTH:  09-16-1936   DATE OF ADMISSION:  01/02/2009  DATE OF DISCHARGE:                              HISTORY & PHYSICAL   Amy [name] is a 75 year old female with a history of hypertension,  hypercholesterolemia, shortness of breath, and dyspnea.  We are  admitting her today for heart catheterization after having an abnormal  stress test.   Amy [name] is an elderly female with a long history of cigarette  smoking.  She apparently quit last year.  She had been having some  shortness of breath and some mild episodes of chest pain.  These  episodes of shortness of breath occur at various times and are not  necessarily associated with eating, drinking, change of position, or  taking a deep breath.  She also has some intermittent episodes of chest  pain, but these are fairly atypical, but these also are not necessarily  associated with eating, drinking, change of position, or taking a deep  breath.   She apparently had some abdominal pains.  An abdominal ultrasound  revealed a mass and a followup CT scan revealed a right adrenal mass.  She was referred to [place] for preoperative evaluation and to have a stress  Cardiolite study.   CURRENT MEDICATIONS:  1. Benicar HCT 40 mg/12.5 mg once a day.  2. Darvocet 3 times a day.  3. Corgard 40 mg a day.  4. Aspirin 81 mg a day.  5. Diclofenac once a day.  6. Zocor 20 mg a day.  7. Vitamin D and vitamin E once a day.  8. Zinc once  a day.  9. Vitamin B once a day.   ALLERGIES:  She is allergic to SULFA.   PAST MEDICAL HISTORY:  1. Hypertension.  2. Hyperlipidemia.  3. Arthritis.  4. History of right adrenal mass.   SOCIAL HISTORY:  The patient used to smoke.  It is still unclear whether  she still sneaks a  cigarette occasionally.  She does not drink alcohol.  She is retired from [employer].   FAMILY HISTORY:  Her father died at age 74 due to myocardial infarction.  Her father also had a history of a CVA and diabetes mellitus as well as  hypertension.  Her mother died at age 42 due to a CVA.  Her mother also  had hypertension and CVA.   REVIEW OF SYSTEMS:  Reviewed.  She denies any heat or cold intolerance,  weight gain or weight loss.  She denies any PND, orthopnea, syncope, or  presyncope.  All other systems were reviewed and are negative.   PHYSICAL EXAMINATION:  GENERAL:  She is an elderly female, in no acute  distress.  She is alert and oriented x3.  Her mood and affect are  normal.  VITAL SIGNS:  Her weight is 141 and blood pressure is 142/70 with a  heart rate of 74.  HEENT:  She is normocephalic and atraumatic.  NECK:  Supple.  2+ carotids.  She has no bruits, no JVD, no thyromegaly.  LUNGS:  Clear.  BACK:  Nontender.  HEART:  Regular rate, S1 and S2.  PMI is nondisplaced.  CHEST:  Wall is nontender.  ABDOMEN:  Good bowel sounds.  There is no hepatosplenomegaly.  There is  no guarding or rebound.  SKIN:  Warm and dry.  EXTREMITIES:  She has no clubbing, cyanosis, or edema.  Her pulses are  trace to 1+.  NEUROLOGIC:  Nonfocal.  Cranial nerves II through XII are intact, and  her motor and sensory function intact.  Gait is normal.   Her EKG reveals normal sinus rhythm.  She has nonspecific ST and T-wave  changes.   Her stress Cardiolite study reveals the presence of anteroapical  ischemia.   Amy [name] presents with a history of chest pain and shortness of  breath.  She has a long history of cigarette smoking and hypertension.  She also has dyslipidemia.  She also was recently found to have an  adrenal mass.  We will schedule her for an outpatient cardiac  catheterization.  I do not think that we should proceed with angioplasty  and stenting until we further define her  adrenal mass.  If she needs to  have surgery on this adrenal mass, she certainly would not be able to be  on Plavix, which would limit her choice of stents.  We will perform  heart catheterization and we will contact her medical doctors for other  discussion.  We have discussed the risks, benefits, and options with  her.  She understands and agrees to proceed.      Thayer Headings, M.D.  Electronically Signed     PJN/MEDQ  D:  12/30/2008  T:  12/31/2008  Job:  [id number]   cc:   Burnard Bunting, M.D.  Darlin Coco, M.D.

## 2011-03-25 NOTE — Discharge Summary (Signed)
Amy [name], Amy [name]                ACCOUNT NO.:  [id number]   MEDICAL RECORD NO.:  [id number]          PATIENT TYPE:  INP   LOCATION:  [id number]                         FACILITY:  [facility]   PHYSICIAN:  Odis Hollingshead, M.D.DATE OF BIRTH:  [birth year]   DATE OF ADMISSION:  02/10/2009  DATE OF DISCHARGE:  02/16/2009                               DISCHARGE SUMMARY   DISCHARGE DIAGNOSIS:  Myelolipoma of right adrenal gland.   SECONDARY DIAGNOSES:  1. Cardiac dysrhythmia.  2. Hypercholesterolemia.  3. Hypertension.  4. Postoperative ileus.   PROCEDURE:  Resection of right adrenal tumor and adrenalectomy on February 10, 2009.   REASON FOR ADMISSION:  Amy [name] is a 75 year old female who was having  some problems with the right costal margin and right upper quadrant  abdominal pain.  A CT scan of the chest was unremarkable and one of the  abdomen demonstrated a right upper quadrant mass in the infrahepatic  region suspicious for an adrenal tumor.  She is admitted for resection.   HOSPITAL COURSE:  She underwent resection of the right adrenal tumor and  adrenalectomy on February 10, 2009.  She had a slow postoperative course  with a mild ileus.  We slowly advanced her diet and put her on oral  analgesics.  However, her bowel function is very slow to return.  By 6th  postoperative day, she had return of bowel function and was feeling  better.  Hemoglobin was 12.7.  She was afebrile and able to be  discharged.   DISPOSITION:  Discharged to home on February 16, 2009, in satisfactory  condition.  She is given discharge instructions and Percocet for pain.  She will follow up with me in the office in 2-3 weeks.      Odis Hollingshead, M.D.  Electronically Signed     TJR/MEDQ  D:  04/13/2009  T:  04/14/2009  Job:  [id number]

## 2011-04-14 ENCOUNTER — Other Ambulatory Visit: Payer: Self-pay

## 2011-04-14 ENCOUNTER — Ambulatory Visit: Payer: Self-pay | Admitting: Vascular Surgery

## 2011-04-27 ENCOUNTER — Other Ambulatory Visit (INDEPENDENT_AMBULATORY_CARE_PROVIDER_SITE_OTHER): Payer: Medicare Other

## 2011-04-27 ENCOUNTER — Ambulatory Visit (INDEPENDENT_AMBULATORY_CARE_PROVIDER_SITE_OTHER): Payer: Medicare Other

## 2011-04-27 DIAGNOSIS — I6529 Occlusion and stenosis of unspecified carotid artery: Secondary | ICD-10-CM

## 2011-04-27 DIAGNOSIS — Z48812 Encounter for surgical aftercare following surgery on the circulatory system: Secondary | ICD-10-CM

## 2011-04-27 NOTE — Assessment & Plan Note (Signed)
OFFICE VISIT  [name] [name] L DOB:  1936/01/14                                       04/27/2011 [id number]  CHIEF COMPLAINT:  A 61-month follow-up, status post left carotid endarterectomy.  Patient is a 75 year old woman who was found to have a greater than 80% stenosis of the left carotid artery after it was noted she had a left carotid bruit on routine physical exam.  The patient related no symptoms or focal deficits prior to surgery.  She had several weakness spells with numbness in both hands and a tremor; however, she denies any symptoms of amaurosis, TIA or stroke.  The patient was taken to the operating room on 09/29/2011 for a left carotid endarterectomy with Dacron patch angioplasty.  Postop the patient did very well.  She went home, and this is her 80-month follow-up visit.  She denies any symptoms of TIA, stroke, amaurosis, or aphasia.  VASCULAR STUDIES DONE TODAY:  Follow-up duplex scan showed ICA peak systolic velocity of [id number] on the right and 150 on the left.  The end diastolic velocities were 26 bilaterally.  This was read as a right internal carotid artery stenosis of 1% to 39%.  The left internal carotid artery was patent.  PHYSICAL EXAMINATION:  This is a well-developed, well-nourished woman in no acute distress.  She has a normal gait.  Vital signs:  Her blood pressure is 150/74, heart rate is 71, and her sats are 100%.  She had good and equal strength in bilateral upper and lower extremity.  She had no tongue deviation.  No facial droop.  Her left neck wound was well- healed without erythema or hematoma.  ASSESSMENT:  Patient doing well status post left carotid endarterectomy with no neurological symptoms and stable carotid duplex with patent left carotid artery.  She will follow up in 6 months with a new duplex scan.  [name] Kearns, PA-C  [name] [name]. [name] [name], M.D. Electronically Signed  RR/MEDQ  D:  04/27/2011  T:   04/27/2011  Job:  [id number]

## 2011-05-03 NOTE — Procedures (Unsigned)
CAROTID DUPLEX EXAM  INDICATION:  Follow up carotid stenosis.  HISTORY: Diabetes:  No. Cardiac:  Medication for tachycardia. Hypertension:  Yes. Smoking:  Previous. Previous Surgery:  Left carotid endarterectomy on 09/28/2010. CV History:  Asymptomatic. Amaurosis Fugax No, Paresthesias No, Hemiparesis No.                                      RIGHT             LEFT Brachial systolic pressure:         176               170 Brachial Doppler waveforms:         WNL               WNL Vertebral direction of flow:        Antegrade         Antegrade DUPLEX VELOCITIES (cm/sec) CCA peak systolic                   96                77 ECA peak systolic                   131               0000000 ICA peak systolic                   140               [id number] ICA end diastolic                   26                26 PLAQUE MORPHOLOGY:                  Heterogenous      Heterogenous PLAQUE AMOUNT:                      Mild              Mild PLAQUE LOCATION:                    CCA, ICA, ECA     CCA, ICA, ECA  IMPRESSION: 1. Right internal carotid artery stenosis in the 1% to 39% range. 2. Left internal carotid artery patent with history of endarterectomy,     elevated velocities present, suggestive of stenosis, however, may     be consistent with recently endarterectomized vessel.  ___________________________________________ Jessy Oto. Fields, MD  SH/MEDQ  D:  04/27/2011  T:  04/27/2011  Job:  [id number]

## 2011-11-15 ENCOUNTER — Other Ambulatory Visit: Payer: Medicare Other

## 2011-12-07 ENCOUNTER — Other Ambulatory Visit (INDEPENDENT_AMBULATORY_CARE_PROVIDER_SITE_OTHER): Payer: Medicare Other | Admitting: *Deleted

## 2011-12-07 DIAGNOSIS — Z48812 Encounter for surgical aftercare following surgery on the circulatory system: Secondary | ICD-10-CM

## 2011-12-07 DIAGNOSIS — I6529 Occlusion and stenosis of unspecified carotid artery: Secondary | ICD-10-CM

## 2011-12-14 ENCOUNTER — Other Ambulatory Visit: Payer: Self-pay | Admitting: *Deleted

## 2011-12-14 DIAGNOSIS — I6529 Occlusion and stenosis of unspecified carotid artery: Secondary | ICD-10-CM

## 2011-12-14 DIAGNOSIS — Z48812 Encounter for surgical aftercare following surgery on the circulatory system: Secondary | ICD-10-CM

## 2011-12-15 ENCOUNTER — Encounter: Payer: Self-pay | Admitting: Vascular Surgery

## 2011-12-15 NOTE — Procedures (Unsigned)
CAROTID DUPLEX EXAM  INDICATION:  Follow up left CEA.  HISTORY: Diabetes:  No. Cardiac:  No. Hypertension:  Yes. Smoking:  Previous. Previous Surgery:  Left CEA performed 09/28/2010. CV History: Amaurosis Fugax No, Paresthesias No, Hemiparesis No                                      RIGHT             LEFT Brachial systolic pressure:         166               158 Brachial Doppler waveforms:         WNL               WNL Vertebral direction of flow:        Antegrade         Abnormal antegrade DUPLEX VELOCITIES (cm/sec) CCA peak systolic                   95                95 ECA peak systolic                   135               [id number] ICA peak systolic                   173               [id number] ICA end diastolic                   33                24 PLAQUE MORPHOLOGY:                  Calcific          Heterogeneous PLAQUE AMOUNT:                      Moderate          Minimal PLAQUE LOCATION:                    ICA/ECA           ICA/ECA  IMPRESSION: 1. Technically difficult study due to short neck and respiratory     motion. 2. 1-39% right internal carotid artery stenosis by velocity; however,     image appears worse and velocities may be underestimated due to     calcific shadow. 3. Minimal hyperplasia of the left carotid endarterectomy is observed. 4. Bunny rabbit flow is observed in the left vertebral artery with     significantly elevated velocities in the left subclavian artery,     both of which were not previously reported.  ___________________________________________ [name]. Kellie Simmering, M.D.  LT/MEDQ  D:  12/07/2011  T:  12/07/2011  Job:  [id number]

## 2012-01-18 ENCOUNTER — Other Ambulatory Visit: Payer: Self-pay | Admitting: *Deleted

## 2012-01-18 ENCOUNTER — Encounter: Payer: Self-pay | Admitting: *Deleted

## 2012-04-06 ENCOUNTER — Encounter: Payer: Self-pay | Admitting: Cardiology

## 2012-04-12 ENCOUNTER — Encounter: Payer: Self-pay | Admitting: Cardiovascular Disease

## 2012-11-06 ENCOUNTER — Other Ambulatory Visit: Payer: Medicare Other

## 2012-12-06 ENCOUNTER — Ambulatory Visit: Payer: Medicare Other | Admitting: Neurosurgery

## 2012-12-06 ENCOUNTER — Other Ambulatory Visit: Payer: Medicare Other

## 2013-02-19 ENCOUNTER — Encounter: Payer: Self-pay | Admitting: Cardiology

## 2014-12-29 ENCOUNTER — Encounter: Payer: Self-pay | Admitting: Internal Medicine

## 2015-03-31 ENCOUNTER — Encounter: Payer: Self-pay | Admitting: Internal Medicine

## 2015-06-02 ENCOUNTER — Other Ambulatory Visit: Payer: Self-pay | Admitting: Internal Medicine

## 2015-06-02 DIAGNOSIS — Z1231 Encounter for screening mammogram for malignant neoplasm of breast: Secondary | ICD-10-CM

## 2015-06-05 ENCOUNTER — Ambulatory Visit
Admission: RE | Admit: 2015-06-05 | Discharge: 2015-06-05 | Disposition: A | Discharge status: Home / Self Care | Payer: Medicare Other | Source: Ambulatory Visit | Attending: Internal Medicine | Admitting: Internal Medicine

## 2015-06-05 DIAGNOSIS — Z1231 Encounter for screening mammogram for malignant neoplasm of breast: Secondary | ICD-10-CM

## 2016-11-03 ENCOUNTER — Other Ambulatory Visit: Payer: Self-pay | Admitting: Internal Medicine

## 2016-11-03 DIAGNOSIS — I2510 Atherosclerotic heart disease of native coronary artery without angina pectoris: Secondary | ICD-10-CM

## 2016-11-03 DIAGNOSIS — I998 Other disorder of circulatory system: Secondary | ICD-10-CM

## 2016-11-03 DIAGNOSIS — I739 Peripheral vascular disease, unspecified: Secondary | ICD-10-CM

## 2016-11-04 ENCOUNTER — Ambulatory Visit
Admission: RE | Admit: 2016-11-04 | Discharge: 2016-11-04 | Disposition: A | Discharge status: Home / Self Care | Payer: Medicare Other | Source: Ambulatory Visit | Attending: Internal Medicine | Admitting: Internal Medicine

## 2016-11-04 ENCOUNTER — Other Ambulatory Visit: Payer: Self-pay | Admitting: Vascular Surgery

## 2016-11-04 DIAGNOSIS — I998 Other disorder of circulatory system: Secondary | ICD-10-CM

## 2016-11-04 DIAGNOSIS — I739 Peripheral vascular disease, unspecified: Secondary | ICD-10-CM

## 2016-11-04 DIAGNOSIS — I2510 Atherosclerotic heart disease of native coronary artery without angina pectoris: Secondary | ICD-10-CM

## 2016-11-04 DIAGNOSIS — R0989 Other specified symptoms and signs involving the circulatory and respiratory systems: Secondary | ICD-10-CM

## 2016-11-04 MED ORDER — GADOBENATE DIMEGLUMINE 529 MG/ML IV SOLN
8.0000 mL | Freq: Once | INTRAVENOUS | Status: AC | PRN
  Administered 2016-11-04: 8 mL via INTRAVENOUS

## 2016-11-10 ENCOUNTER — Ambulatory Visit (HOSPITAL_COMMUNITY)
Admission: RE | Admit: 2016-11-10 | Discharge: 2016-11-10 | Disposition: A | Discharge status: Home / Self Care | Payer: Medicare Other | Source: Ambulatory Visit | Attending: Vascular Surgery | Admitting: Vascular Surgery

## 2016-11-10 ENCOUNTER — Encounter: Payer: Self-pay | Admitting: Vascular Surgery

## 2016-11-10 ENCOUNTER — Ambulatory Visit (INDEPENDENT_AMBULATORY_CARE_PROVIDER_SITE_OTHER): Payer: Medicare Other | Admitting: Vascular Surgery

## 2016-11-10 VITALS — BP 122/80 | HR 85 | Temp 97.8°F | Resp 16 | Ht 65.0 in | Wt 148.0 lb

## 2016-11-10 DIAGNOSIS — I6523 Occlusion and stenosis of bilateral carotid arteries: Secondary | ICD-10-CM

## 2016-11-10 DIAGNOSIS — R0989 Other specified symptoms and signs involving the circulatory and respiratory systems: Secondary | ICD-10-CM | POA: Insufficient documentation

## 2016-11-10 DIAGNOSIS — I771 Stricture of artery: Secondary | ICD-10-CM

## 2016-11-12 NOTE — Progress Notes (Signed)
Referring Physician: Dr Reynaldo Minium  Patient name: Amy [name] MRN: [id number] DOB: February 19, 1936 Sex: female  REASON FOR CONSULT: Right arm pain with bilateral upper extremity blood pressure discrepancy  HPI: Amy [name] is a 81 y.o. female, who has a chronic history of her left upper extremity blood pressure being lower than her right arm blood pressure. She has noticed this when she monitors her blood pressure at home. She recently began have pain in her right shoulder and was curious whether or not this had anything to do with her blood pressure discrepancy. I reviewed her records from 2011. She had a 20 mm gradient between her left brachial to right brachial pressure at that time. She does occasionally develop some dizziness if her head is tilted back and she is working above her head. She actually has her children change light bulbs in-house because it makes her dizzy. She does have some mild exertional fatigue in the left lower extremity. On the right side she complains primarily of pain in the upper arm and shoulder area. She currently denies any symptoms of TIA amaurosis or stroke. Other medical problems include hypertension and elevated cholesterol both of which are currently stable. She is a former smoker but quit almost 10 years ago. She is on daily aspirin but not a statin.  She had a left carotid endarterectomy by me in 2011. This was for symptomatic carotid stenosis.    Past Medical History:  Diagnosis Date  . Arthritis   . HTN (hypertension)   . Hypercholesterolemia    Past Surgical History:  Procedure Laterality Date  . BACK SURGERY    . CARDIAC CATHETERIZATION  01-02-2009   The anterior descending artery is moderate  in size. There is an eccentric 60-70% proximal stenosis followed by 50-60% mid stenosis. EF 75-80%  . CARDIOVASCULAR STRESS TEST  12-26-2008   EF 65%  . CATARACT EXTRACTION    . CYSTECTOMY     Right Hand  . HEMORROIDECTOMY    . TONSILLECTOMY    . TOTAL  ABDOMINAL HYSTERECTOMY      No family history on file.  SOCIAL HISTORY: Social History   Social History  . Marital status: Widowed    Spouse name: N/A  . Number of children: N/A  . Years of education: N/A   Occupational History  . Not on file.   Social History Main Topics  . Smoking status: Former Research scientist (life sciences)  . Smokeless tobacco: Not on file     Comment: Quite 2009  . Alcohol use No  . Drug use: Unknown  . Sexual activity: Not on file   Other Topics Concern  . Not on file   Social History Narrative  . No narrative on file    Allergies  Allergen Reactions  . Codeine     REACTION: nausea/vomiting  . Sulfonamide Derivatives     REACTION: rash    Current Outpatient Prescriptions  Medication Sig Dispense Refill  . aspirin 81 MG tablet Take 81 mg by mouth daily.    . cholecalciferol (VITAMIN D) 1000 UNITS tablet Take 1,000 Units by mouth daily.    Marland Kitchen DICLOFENAC SODIUM PO Take 100 mg by mouth daily.    . isosorbide mononitrate (IMDUR) 60 MG 24 hr tablet Take 60 mg by mouth. Taking 0.5 Tablet Daily    . nadolol (CORGARD) 40 MG tablet Take 40 mg by mouth daily.    . nitroGLYCERIN (NITROSTAT) 0.4 MG SL tablet Place 0.4 mg under the tongue every  5 (five) minutes as needed.    . Propoxyphene N-Acetaminophen (DARVOCET-N 100 PO) Take by mouth 3 (three) times daily.    . vitamin B-12 (CYANOCOBALAMIN) 1000 MCG tablet Take 2,000 mcg by mouth daily.    . vitamin E 1000 UNIT capsule Take 1,000 Units by mouth daily.     No current facility-administered medications for this visit.     ROS:   General:  No weight loss, Fever, chills  HEENT: No recent headaches, no nasal bleeding, no visual changes, no sore throat  Neurologic: + dizziness, no blackouts, no seizures. No recent symptoms of stroke or mini- stroke. No recent episodes of slurred speech, or temporary blindness.  Cardiac: No recent episodes of chest pain/pressure, no shortness of breath at rest.  + shortness of breath with  exertion.  Denies history of atrial fibrillation or irregular heartbeat  Vascular: No history of rest pain in feet.  No history of claudication.  No history of non-healing ulcer, No history of DVT   Pulmonary: No home oxygen, no productive cough, no hemoptysis,  No asthma or wheezing  Musculoskeletal:  [ ]  Arthritis, [ ]  Low back pain,  [ ]  Joint pain  Hematologic:No history of hypercoagulable state.  No history of easy bleeding.  No history of anemia  Gastrointestinal: No hematochezia or melena,  No gastroesophageal reflux, no trouble swallowing  Urinary: [ ]  chronic Kidney disease, [ ]  on HD - [ ]  MWF or [ ]  TTHS, [ ]  Burning with urination, [ ]  Frequent urination, [ ]  Difficulty urinating;   Skin: No rashes  Psychological: No history of anxiety,  No history of depression   Physical Examination  Vitals:   11/10/16 1103 11/10/16 1112  BP: (!) 160/70 122/80  Pulse: 85   Resp: 16   Temp: 97.8 F (36.6 C)   TempSrc: Oral   SpO2: 95%   Weight: 148 lb (67.1 kg)   Height: 5\' 5"  (1.651 m)     Body mass index is 24.63 kg/m.  General:  Alert and oriented, no acute distress HEENT: Normal Neck: Left carotid bruit, 2+ carotid pulses bilaterally Pulmonary: Clear to auscultation bilaterally Cardiac: Regular Rate and Rhythm 2/6 systolic murmur heard throughout the precordium  Abdomen: Soft, non-tender, non-distended, no mass Skin: No rash Extremity Pulses:  2+ radial, brachial right side absent left radial pulse Musculoskeletal: No deformity or edema  Neurologic: Upper and lower extremity motor 5/5 and symmetric  DATA:  Patient had an upper extremity arterial duplex which showed triphasic waveforms on the right monophasic on the left suggestive of left subclavian artery stenosis. The patient also had an MRA of the neck performed December 2017.  Innominate stenosis is estimated at 30-50%. Left common carotid stenosis is estimated at 50%. Left subclavian stenosis is estimated at 70%  or greater. It was also no antegrade flow in the left vertebral artery suggestive of reversal of flow secondary to the subclavian stenosis  ASSESSMENT:  Patient's blood pressure discrepancy can be explained by her left subclavian stenosis. This has probably been present for several years as she had a blood pressure discrepancy as far back as 2011. She currently probably has some symptoms from this with left arm exertional fatigue and some occasional dizziness when working with her left arm above her head. I do not believe there is any vascular etiology for her right shoulder pain which is her primary complaint in the office today. I did discuss with her the possibility of an arteriogram for possible angioplasty or  stenting of her left subclavian stenosis or consideration of left carotid to subclavian bypass for improvement of her dizziness and left arm exertional fatigue symptoms. She currently did not seem interested in this. I believe her overall stroke risk is low. Of note her left carotid endarterectomy site is patent and she does have a moderate carotid stenosis on the right side.  I discussed with the patient that she should always have the blood pressure measured in her right arm as this is going to be more accurate the left arm is going to be artificially low.  If she wishes to have an intervention on her left subclavian she can call me at any time and we can consider this.  Otherwise she needs a repeat carotid duplex exam for surveillance in 6 months.  We'll leave at the consideration of her primary care physician whether or not to start a statin however there is evidence that patients with peripheral arterial/carotid disease have increased lifespan if on a statin.  She will continue her aspirin.   PLAN: See above   Ruta Hinds, MD [facility] Office: [phone] Pager: [phone]

## 2016-11-14 ENCOUNTER — Other Ambulatory Visit: Payer: Self-pay

## 2016-11-14 DIAGNOSIS — I6529 Occlusion and stenosis of unspecified carotid artery: Secondary | ICD-10-CM

## 2017-02-13 ENCOUNTER — Emergency Department (HOSPITAL_COMMUNITY): Payer: Medicare Other

## 2017-02-13 ENCOUNTER — Observation Stay (HOSPITAL_COMMUNITY)
Admission: EM | Admit: 2017-02-13 | Discharge: 2017-02-15 | Disposition: A | Discharge status: Home / Self Care | Payer: Medicare Other | Attending: Internal Medicine | Admitting: Internal Medicine

## 2017-02-13 ENCOUNTER — Encounter: Payer: Self-pay | Admitting: Emergency Medicine

## 2017-02-13 DIAGNOSIS — R7989 Other specified abnormal findings of blood chemistry: Secondary | ICD-10-CM | POA: Diagnosis present

## 2017-02-13 DIAGNOSIS — I10 Essential (primary) hypertension: Secondary | ICD-10-CM | POA: Diagnosis not present

## 2017-02-13 DIAGNOSIS — Z79899 Other long term (current) drug therapy: Secondary | ICD-10-CM | POA: Diagnosis not present

## 2017-02-13 DIAGNOSIS — Z7982 Long term (current) use of aspirin: Secondary | ICD-10-CM | POA: Diagnosis not present

## 2017-02-13 DIAGNOSIS — D62 Acute posthemorrhagic anemia: Secondary | ICD-10-CM | POA: Diagnosis not present

## 2017-02-13 DIAGNOSIS — Z87891 Personal history of nicotine dependence: Secondary | ICD-10-CM | POA: Diagnosis not present

## 2017-02-13 DIAGNOSIS — Z7951 Long term (current) use of inhaled steroids: Secondary | ICD-10-CM | POA: Diagnosis not present

## 2017-02-13 DIAGNOSIS — R9431 Abnormal electrocardiogram [ECG] [EKG]: Secondary | ICD-10-CM | POA: Diagnosis not present

## 2017-02-13 DIAGNOSIS — N179 Acute kidney failure, unspecified: Secondary | ICD-10-CM | POA: Diagnosis not present

## 2017-02-13 DIAGNOSIS — Z86010 Personal history of colonic polyps: Secondary | ICD-10-CM | POA: Insufficient documentation

## 2017-02-13 DIAGNOSIS — R748 Abnormal levels of other serum enzymes: Secondary | ICD-10-CM | POA: Diagnosis present

## 2017-02-13 DIAGNOSIS — K633 Ulcer of intestine: Secondary | ICD-10-CM | POA: Diagnosis not present

## 2017-02-13 DIAGNOSIS — K5730 Diverticulosis of large intestine without perforation or abscess without bleeding: Secondary | ICD-10-CM | POA: Insufficient documentation

## 2017-02-13 DIAGNOSIS — D649 Anemia, unspecified: Secondary | ICD-10-CM | POA: Diagnosis present

## 2017-02-13 DIAGNOSIS — I700 Atherosclerosis of aorta: Secondary | ICD-10-CM | POA: Diagnosis not present

## 2017-02-13 DIAGNOSIS — Z66 Do not resuscitate: Secondary | ICD-10-CM | POA: Diagnosis not present

## 2017-02-13 DIAGNOSIS — D72829 Elevated white blood cell count, unspecified: Secondary | ICD-10-CM | POA: Diagnosis not present

## 2017-02-13 DIAGNOSIS — R531 Weakness: Secondary | ICD-10-CM | POA: Diagnosis present

## 2017-02-13 LAB — URINALYSIS, ROUTINE W REFLEX MICROSCOPIC
BILIRUBIN URINE: NEGATIVE
Glucose, UA: NEGATIVE mg/dL
Hgb urine dipstick: NEGATIVE
Ketones, ur: NEGATIVE mg/dL
Leukocytes, UA: NEGATIVE
NITRITE: NEGATIVE
Protein, ur: NEGATIVE mg/dL
Specific Gravity, Urine: 1.012 (ref 1.005–1.030)
pH: 5 (ref 5.0–8.0)

## 2017-02-13 LAB — RETICULOCYTES
RBC.: 3.18 MIL/uL — ABNORMAL LOW (ref 3.87–5.11)
Retic Count, Absolute: 124 K/uL (ref 19.0–186.0)
Retic Ct Pct: 3.9 % — ABNORMAL HIGH (ref 0.4–3.1)

## 2017-02-13 LAB — BASIC METABOLIC PANEL
ANION GAP: 14 (ref 5–15)
BUN: 64 mg/dL — AB (ref 6–20)
CALCIUM: 9.4 mg/dL (ref 8.9–10.3)
CO2: 24 mmol/L (ref 22–32)
Chloride: 96 mmol/L — ABNORMAL LOW (ref 101–111)
Creatinine, Ser: 1.9 mg/dL — ABNORMAL HIGH (ref 0.44–1.00)
GFR calc Af Amer: 28 mL/min — ABNORMAL LOW (ref >60)
GFR, EST NON AFRICAN AMERICAN: 24 mL/min — AB (ref >60)
GLUCOSE: 135 mg/dL — AB (ref 65–99)
Potassium: 4.2 mmol/L (ref 3.5–5.1)
Sodium: 134 mmol/L — ABNORMAL LOW (ref 135–145)

## 2017-02-13 LAB — HEPATIC FUNCTION PANEL
ALK PHOS: 110 U/L (ref 38–126)
ALT: 12 U/L — AB (ref 14–54)
AST: 13 U/L — ABNORMAL LOW (ref 15–41)
Albumin: 2.6 g/dL — ABNORMAL LOW (ref 3.5–5.0)
BILIRUBIN DIRECT: 0.1 mg/dL (ref 0.1–0.5)
BILIRUBIN INDIRECT: 0.3 mg/dL (ref 0.3–0.9)
Total Bilirubin: 0.4 mg/dL (ref 0.3–1.2)
Total Protein: 6.9 g/dL (ref 6.5–8.1)

## 2017-02-13 LAB — CBC
HCT: 21.3 % — ABNORMAL LOW (ref 36.0–46.0)
Hemoglobin: 6.4 g/dL — CL (ref 12.0–15.0)
MCH: 24.4 pg — ABNORMAL LOW (ref 26.0–34.0)
MCHC: 30 g/dL (ref 30.0–36.0)
MCV: 81.3 fL (ref 78.0–100.0)
Platelets: 459 K/uL — ABNORMAL HIGH (ref 150–400)
RBC: 2.62 MIL/uL — ABNORMAL LOW (ref 3.87–5.11)
RDW: 15.8 % — ABNORMAL HIGH (ref 11.5–15.5)
WBC: 12.8 K/uL — ABNORMAL HIGH (ref 4.0–10.5)

## 2017-02-13 LAB — IRON AND TIBC
IRON: 11 ug/dL — AB (ref 28–170)
SATURATION RATIOS: 3 % — AB (ref 10.4–31.8)
TIBC: 322 ug/dL (ref 250–450)
UIBC: 311 ug/dL

## 2017-02-13 LAB — PREPARE RBC (CROSSMATCH)

## 2017-02-13 LAB — TROPONIN I: Troponin I: 0.03 ng/mL (ref <0.03)

## 2017-02-13 LAB — LACTIC ACID, PLASMA: Lactic Acid, Venous: 1.5 mmol/L (ref 0.5–1.9)

## 2017-02-13 LAB — FOLATE: FOLATE: 15.1 ng/mL (ref >5.9)

## 2017-02-13 LAB — CBG MONITORING, ED: GLUCOSE-CAPILLARY: 117 mg/dL — AB (ref 65–99)

## 2017-02-13 LAB — POC OCCULT BLOOD, ED: Fecal Occult Bld: NEGATIVE

## 2017-02-13 LAB — VITAMIN B12: Vitamin B-12: >7500 pg/mL — ABNORMAL HIGH (ref 180–914)

## 2017-02-13 LAB — FERRITIN: Ferritin: 31 ng/mL (ref 11–307)

## 2017-02-13 MED ORDER — PANTOPRAZOLE SODIUM 40 MG IV SOLR
40.0000 mg | Freq: Two times a day (BID) | INTRAVENOUS | Status: DC
Start: 2017-02-13 — End: 2017-02-14
  Administered 2017-02-13 – 2017-02-14 (×2): 40 mg via INTRAVENOUS
  Filled 2017-02-13 (×2): qty 40

## 2017-02-13 MED ORDER — AMLODIPINE BESYLATE 10 MG PO TABS
10.0000 mg | ORAL_TABLET | Freq: Every day | ORAL | Status: DC
  Administered 2017-02-14: 10 mg via ORAL
  Filled 2017-02-13: qty 1

## 2017-02-13 MED ORDER — ATENOLOL 50 MG PO TABS
50.0000 mg | ORAL_TABLET | Freq: Every day | ORAL | Status: DC
  Administered 2017-02-14: 50 mg via ORAL
  Filled 2017-02-13: qty 1

## 2017-02-13 MED ORDER — HYDROCODONE-ACETAMINOPHEN 5-325 MG PO TABS
1.0000 | ORAL_TABLET | Freq: Four times a day (QID) | ORAL | Status: DC | PRN
  Administered 2017-02-14 – 2017-02-15 (×2): 1 via ORAL
  Filled 2017-02-13 (×2): qty 1

## 2017-02-13 MED ORDER — SODIUM CHLORIDE 0.9 % IV SOLN
Freq: Once | INTRAVENOUS | Status: AC
  Administered 2017-02-13: 18:00:00 via INTRAVENOUS

## 2017-02-13 MED ORDER — ONDANSETRON HCL 4 MG PO TABS: 4.0000 mg | ORAL_TABLET | Freq: Four times a day (QID) | ORAL | Status: DC | PRN

## 2017-02-13 MED ORDER — SENNA 8.6 MG PO TABS
1.0000 | ORAL_TABLET | Freq: Two times a day (BID) | ORAL | Status: DC
  Administered 2017-02-13 – 2017-02-14 (×3): 8.6 mg via ORAL
  Filled 2017-02-13 (×3): qty 1

## 2017-02-13 MED ORDER — ONDANSETRON HCL 4 MG/2ML IJ SOLN: 4.0000 mg | Freq: Four times a day (QID) | INTRAMUSCULAR | Status: DC | PRN

## 2017-02-13 MED ORDER — BISACODYL 10 MG RE SUPP: 10.0000 mg | Freq: Every day | RECTAL | Status: DC | PRN

## 2017-02-13 MED ORDER — SODIUM CHLORIDE 0.9% FLUSH
3.0000 mL | Freq: Two times a day (BID) | INTRAVENOUS | Status: DC
  Administered 2017-02-13 – 2017-02-14 (×3): 3 mL via INTRAVENOUS

## 2017-02-13 MED ORDER — ACETAMINOPHEN 325 MG PO TABS: 650.0000 mg | ORAL_TABLET | Freq: Four times a day (QID) | ORAL | Status: DC | PRN

## 2017-02-13 MED ORDER — POLYETHYLENE GLYCOL 3350 17 G PO PACK: 17.0000 g | PACK | Freq: Every day | ORAL | Status: DC | PRN

## 2017-02-13 MED ORDER — SODIUM CHLORIDE 0.9 % IV SOLN: INTRAVENOUS | Status: DC

## 2017-02-13 MED ORDER — ACETAMINOPHEN 650 MG RE SUPP: 650.0000 mg | Freq: Four times a day (QID) | RECTAL | Status: DC | PRN

## 2017-02-13 MED ORDER — LORATADINE 10 MG PO TABS
10.0000 mg | ORAL_TABLET | Freq: Every day | ORAL | Status: DC
  Administered 2017-02-14: 10 mg via ORAL
  Filled 2017-02-13: qty 1

## 2017-02-13 NOTE — ED Triage Notes (Addendum)
Pt has been experiencing weakness and dizziness since January. Had blood drawn this morning and HGB 6.4. Pt arrives to ED pale and lethargic. Denies black stool or being on blood thinner, but reports long ter use of diclofenac,

## 2017-02-13 NOTE — ED Provider Notes (Signed)
[facility] DEPT Provider Note   CSN: [id number] Arrival date & time: 02/13/17  1325     History   Chief Complaint Chief Complaint  Patient presents with  . Weakness    HGB 6.4    HPI Amy [name] is a 81 y.o. female.   Weakness  Primary symptoms include no focal weakness, no loss of sensation. This is a new problem. The current episode started more than 1 week ago. The problem has been gradually worsening. There was no focality noted. There has been no fever. The fever has been present for less than 1 day. Pertinent negatives include no shortness of breath and no vomiting. There were no medications administered prior to arrival.    Past Medical History:  Diagnosis Date  . Arthritis   . HTN (hypertension)   . Hypercholesterolemia     There are no active problems to display for this patient.   Past Surgical History:  Procedure Laterality Date  . BACK SURGERY    . CARDIAC CATHETERIZATION  01-02-2009   The anterior descending artery is moderate  in size. There is an eccentric 60-70% proximal stenosis followed by 50-60% mid stenosis. EF 75-80%  . CARDIOVASCULAR STRESS TEST  12-26-2008   EF 65%  . CATARACT EXTRACTION    . CYSTECTOMY     Right Hand  . HEMORROIDECTOMY    . TONSILLECTOMY    . TOTAL ABDOMINAL HYSTERECTOMY      OB History    No data available       Home Medications    Prior to Admission medications   Medication Sig Start Date End Date Taking? Authorizing Provider  aspirin 81 MG tablet Take 81 mg by mouth daily.    Historical Provider, MD  cholecalciferol (VITAMIN D) 1000 UNITS tablet Take 1,000 Units by mouth daily.    Historical Provider, MD  DICLOFENAC SODIUM PO Take 100 mg by mouth daily.    Historical Provider, MD  isosorbide mononitrate (IMDUR) 60 MG 24 hr tablet Take 60 mg by mouth. Taking 0.5 Tablet Daily    Historical Provider, MD  nadolol (CORGARD) 40 MG tablet Take 40 mg by mouth daily.    Historical Provider, MD  nitroGLYCERIN  (NITROSTAT) 0.4 MG SL tablet Place 0.4 mg under the tongue every 5 (five) minutes as needed.    Historical Provider, MD  Propoxyphene N-Acetaminophen (DARVOCET-N 100 PO) Take by mouth 3 (three) times daily.    Historical Provider, MD  vitamin B-12 (CYANOCOBALAMIN) 1000 MCG tablet Take 2,000 mcg by mouth daily.    Historical Provider, MD  vitamin E 1000 UNIT capsule Take 1,000 Units by mouth daily.    Historical Provider, MD    Family History No family history on file.  Social History Social History  Substance Use Topics  . Smoking status: Former Research scientist (life sciences)  . Smokeless tobacco: Not on file     Comment: Quite 2009  . Alcohol use No     Allergies   Codeine and Sulfonamide derivatives   Review of Systems Review of Systems  Constitutional: Negative for fatigue and fever.  HENT: Negative for congestion.   Eyes: Negative for pain.  Respiratory: Negative for shortness of breath.   Gastrointestinal: Negative for vomiting.  Neurological: Positive for weakness. Negative for focal weakness.  All other systems reviewed and are negative.    Physical Exam Updated Vital Signs BP (!) 119/44 (BP Location: Right Arm) Comment: Verified twice  Pulse 77   Temp 98.2 F (36.8 C) (Oral)  Resp 16   Ht 5\' 5"  (1.651 m)   Wt 150 lb (68 kg)   SpO2 96%   BMI 24.96 kg/m   Physical Exam  Constitutional: She is oriented to person, place, and time. She appears well-developed and well-nourished.  HENT:  Head: Normocephalic and atraumatic.  Eyes: Conjunctivae and EOM are normal.  Pale conjunctiva  Neck: Normal range of motion.  Cardiovascular: Normal rate and regular rhythm.  Exam reveals no friction rub.   No murmur heard. Pulmonary/Chest: Effort normal. No stridor. No respiratory distress. She has no wheezes.  Abdominal: Soft. She exhibits distension (mild tympanic). She exhibits no mass. There is no tenderness. There is no rebound and no guarding.  Neurological: She is alert and oriented to  person, place, and time. No cranial nerve deficit. Coordination normal.  Skin: Skin is warm and dry. No erythema. No pallor.  Nursing note and vitals reviewed.    ED Treatments / Results  Labs (all labs ordered are listed, but only abnormal results are displayed) Labs Reviewed  BASIC METABOLIC PANEL - Abnormal; Notable for the following:       Result Value   Sodium 134 (*)    Chloride 96 (*)    Glucose, Bld 135 (*)    BUN 64 (*)    Creatinine, Ser 1.90 (*)    GFR calc non Af Amer 24 (*)    GFR calc Af Amer 28 (*)    All other components within normal limits  CBC - Abnormal; Notable for the following:    WBC 12.8 (*)    RBC 2.62 (*)    Hemoglobin 6.4 (*)    HCT 21.3 (*)    MCH 24.4 (*)    RDW 15.8 (*)    Platelets 459 (*)    All other components within normal limits  URINALYSIS, ROUTINE W REFLEX MICROSCOPIC  TROPONIN I  LACTIC ACID, PLASMA  LACTIC ACID, PLASMA  CBG MONITORING, ED  TYPE AND SCREEN    EKG  EKG Interpretation  Date/Time:  Monday February 13 2017 14:44:17 EDT Ventricular Rate:  79 PR Interval:  196 QRS Duration: 84 QT Interval:  372 QTC Calculation: 426 R Axis:   39 Text Interpretation:  Normal sinus rhythm Possible Anterior infarct , age undetermined ST & T wave abnormality, consider inferolateral ischemia Abnormal ECG ST changes new from 2000 Confirmed by [facility] MD, Corene Cornea [phone]) on 02/13/2017 3:01:27 PM       Radiology No results found.  Procedures Procedures (including critical care time)  Medications Ordered in ED Medications - No data to display   Initial Impression / Assessment and Plan / ED Course  I have reviewed the triage vital signs and the nursing notes.  Pertinent labs & imaging results that were available during my care of the patient were reviewed by me and considered in my medical decision making (see chart for details).     Anemia. Also some nonspecific systemic symptoms, will eval for abdominal/chest neoplasm. Possibly  diclofenac but no bloody stools.   Stable symptomatic anemia. Transfusion started. Stable for admission. Dr. Hessie Diener to admit.   Final Clinical Impressions(s) / ED Diagnoses   Final diagnoses:  Anemia, unspecified type      Amy Pew, MD 02/13/17 2303

## 2017-02-13 NOTE — ED Notes (Signed)
Pt taken to CT.

## 2017-02-13 NOTE — ED Notes (Signed)
CBG 117 

## 2017-02-13 NOTE — H&P (Addendum)
Amy [name] OXB:[id number] DOB: October 08, 1936 DOA: 02/13/2017     PCP: Geoffery Lyons, MD   Outpatient Specialists: [facility] v[facility], [place] Patient coming from:   home Lives alone,     Chief Complaint: Lightheadedness  HPI: Amy [name] is a 81 y.o. female with medical history significant of left subclavian stenosis, carotid disease sp left carotid endarterectomy HTN, hypercholesterolemia    Presented with patient had had at least 3 months lightheadedness generalized fatigued she presented to her primary-care office today her blood work done that showed hemoglobin of 6.4 she has no prior history of anemia denies black stools or blood in his stools patient is not on anticoagulation.  [facility] care provider [facility] She does use NSAIDS diclofenac and aspirin Reports hx of dysrhythmia unsure what kind used to be followed by Dr. Wynonia Lawman not on anticoagulation  Hemoccult  negative in ER  She denies any weight loss she is a former smoker  reports good appetite up until few weeks ago when she started to feel dizzy and started to cut off Sodium and ate protein bars.  She have had some constipation  IN ER:  Temp (24hrs), Avg:98 F (36.7 C), Min:97.9 F (36.6 C), Max:98.2 F (36.8 C)      Arm aching oxygen saturation 99% 87 BP 131/37 L a 1.5  sodium 134 potassium 4.2 BUN 64 creatinine 1.9 Troponin 0.03 WBC 1220 hemoglobin 6.4 platelets 459   CT: abdomen pelvis and chest nonacute showing tiny bilateral pulmonary nodules questionable etiology, extensive arthrosclerotic disease, cholelithiasis questionable, sigmoid diverticulosis, [place] left adrenal hemorrhage/infection Following Medications were ordered in ER: Medications  0.9 %  sodium chloride infusion ( Intravenous New Bag/Given 02/13/17 1801)   Hospitalist was called for admission for Symptomatic anemia  Review of Systems:    Pertinent positives include:  fatigue,  lightheadedness  Constitutional:  No weight loss, night sweats, Fevers, chills,weight loss  HEENT:  No headaches, Difficulty swallowing,Tooth/dental problems,Sore throat,  No sneezing, itching, ear ache, nasal congestion, post nasal drip,  Cardio-vascular:  No chest pain, Orthopnea, PND, anasarca, dizziness, palpitations.no Bilateral lower extremity swelling  GI:  No heartburn, indigestion, abdominal pain, nausea, vomiting, diarrhea, change in bowel habits, loss of appetite, melena, blood in stool, hematemesis Resp:  no shortness of breath at rest. No dyspnea on exertion, No excess mucus, no productive cough, No non-productive cough, No coughing up of blood.No change in color of mucus.No wheezing. Skin:  no rash or lesions. No jaundice GU:  no dysuria, change in color of urine, no urgency or frequency. No straining to urinate.  No flank pain.  Musculoskeletal:  No joint pain or no joint swelling. No decreased range of motion. No back pain.  Psych:  No change in mood or affect. No depression or anxiety. No memory loss.  Neuro: no localizing neurological complaints, no tingling, no weakness, no double vision, no gait abnormality, no slurred speech, no confusion  As per HPI otherwise 10 point review of systems negative.   Past Medical History: Past Medical History:  Diagnosis Date  . Arthritis   . HTN (hypertension)   . Hypercholesterolemia    Past Surgical History:  Procedure Laterality Date  . BACK SURGERY    . CARDIAC CATHETERIZATION  01-02-2009   The anterior descending artery is moderate  in size. There is an eccentric 60-70% proximal stenosis followed by 50-60% mid stenosis. EF 75-80%  . CARDIOVASCULAR STRESS TEST  12-26-2008  EF 65%  . CATARACT EXTRACTION    . CYSTECTOMY     Right Hand  . HEMORROIDECTOMY    . TONSILLECTOMY    . TOTAL ABDOMINAL HYSTERECTOMY       Social History:  Ambulatory   Independently    reports that she has quit smoking. She does not have  any smokeless tobacco history on file. She reports that she does not drink alcohol. Her drug history is not on file.  Allergies:   Allergies  Allergen Reactions  . Codeine Nausea And Vomiting    REACTION: nausea/vomiting  . Sulfonamide Derivatives Itching and Rash    Family History:   Family History  Problem Relation Age of Onset  . Diabetes Father   . Diabetes Other   . CAD Neg Hx   . Stroke Neg Hx   . Cancer Neg Hx     Medications: Prior to Admission medications   Medication Sig Start Date End Date Taking? Authorizing Provider  amLODipine (NORVASC) 10 MG tablet Take 10 mg by mouth daily.   Yes Historical Provider, MD  aspirin 81 MG tablet Take 81 mg by mouth daily.   Yes Historical Provider, MD  atenolol (TENORMIN) 50 MG tablet Take 50 mg by mouth daily.   Yes Historical Provider, MD  CALCIUM-VITAMIN D PO Take 1 tablet by mouth 2 (two) times daily.   Yes Historical Provider, MD  cholecalciferol (VITAMIN D) 400 units TABS tablet Take 400 Units by mouth daily.   Yes Historical Provider, MD  Diclofenac Sodium CR 100 MG 24 hr tablet Take 100 mg by mouth daily.   Yes Historical Provider, MD  fluticasone (FLONASE) 50 MCG/ACT nasal spray Place 2 sprays into both nostrils at bedtime.   Yes Historical Provider, MD  HYDROcodone-acetaminophen (NORCO/VICODIN) 5-325 MG tablet Take 1 tablet by mouth every 6 (six) hours as needed for moderate pain.   Yes Historical Provider, MD  loratadine (CLARITIN) 10 MG tablet Take 10 mg by mouth daily.   Yes Historical Provider, MD  meclizine (ANTIVERT) 25 MG tablet Take 25 mg by mouth 3 (three) times daily as needed for dizziness.   Yes Historical Provider, MD  Multiple Vitamin (MULTIVITAMIN WITH MINERALS) TABS tablet Take 1 tablet by mouth daily.   Yes Historical Provider, MD  nitroGLYCERIN (NITROSTAT) 0.4 MG SL tablet Place 0.4 mg under the tongue every 5 (five) minutes as needed for chest pain.    Yes Historical Provider, MD    telmisartan-hydrochlorothiazide (MICARDIS HCT) 80-25 MG tablet Take 1 tablet by mouth daily.   Yes Historical Provider, MD  vitamin B-12 (CYANOCOBALAMIN) 1000 MCG tablet Take 1,000 mcg by mouth daily.    Yes Historical Provider, MD  vitamin E 400 UNIT capsule Take 400 Units by mouth daily.   Yes Historical Provider, MD    Physical Exam: Patient Vitals for the past 24 hrs:  BP Temp Temp src Pulse Resp SpO2 Height Weight  02/13/17 1831 (!) 135/50 97.9 F (36.6 C) Axillary 73 17 98 % - -  02/13/17 1745 (!) 154/47 - - 74 18 - - -  02/13/17 1730 (!) 153/49 - - 76 18 93 % - -  02/13/17 1722 (!) 165/41 98 F (36.7 C) Oral 80 19 99 % - -  02/13/17 1645 (!) 132/38 - - 73 14 100 % - -  02/13/17 1641 (!) 126/45 - - 73 15 97 % - -  02/13/17 1420 (!) 119/44 98.2 F (36.8 C) Oral 77 16 96 % 5\' 5"  (  1.651 m) 68 kg (150 lb)    1. General:  in No Acute distress 2. Psychological: Alert and   Oriented 3. Head/ENT:    Dry Mucous Membranes                          Head Non traumatic, neck supple                          Normal Dentition 4. SKIN:   decreased Skin turgor,  Skin clean Dry and intact no rash 5. Heart: Regular rate and rhythm no Murmur, Rub or gallop 6. Lungs:  Clear to auscultation bilaterally, no wheezes or crackles   7. Abdomen: Soft,  non-tender, Non distended 8. Lower extremities: no clubbing, cyanosis, or edema 9. Neurologically Grossly intact, moving all 4 extremities equally   strength 5 out of 5 in all 4 extremities cranial nerves II through XII intact 10. MSK: Normal range of motion hemo  body mass index is 24.96 kg/m.  Labs on Admission:   Labs on Admission: I have personally reviewed following labs and imaging studies  CBC:  Recent Labs Lab 02/13/17 1455  WBC 12.8*  HGB 6.4*  HCT 21.3*  MCV 81.3  PLT 798*   Basic Metabolic Panel:  Recent Labs Lab 02/13/17 1455  NA 134*  K 4.2  CL 96*  CO2 24  GLUCOSE 135*  BUN 64*  CREATININE 1.90*  CALCIUM 9.4    GFR: Estimated Creatinine Clearance: 21.6 mL/min (A) (by C-G formula based on SCr of 1.9 mg/dL (H)). Liver Function Tests: No results for input(s): AST, ALT, ALKPHOS, BILITOT, PROT, ALBUMIN in the last 168 hours. No results for input(s): LIPASE, AMYLASE in the last 168 hours. No results for input(s): AMMONIA in the last 168 hours. Coagulation Profile: No results for input(s): INR, PROTIME in the last 168 hours. Cardiac Enzymes:  Recent Labs Lab 02/13/17 1503  TROPONINI 0.03*   BNP (last 3 results) No results for input(s): PROBNP in the last 8760 hours. HbA1C: No results for input(s): HGBA1C in the last 72 hours. CBG:  Recent Labs Lab 02/13/17 1640  GLUCAP 117*   Lipid Profile: No results for input(s): CHOL, HDL, LDLCALC, TRIG, CHOLHDL, LDLDIRECT in the last 72 hours. Thyroid Function Tests: No results for input(s): TSH, T4TOTAL, FREET4, T3FREE, THYROIDAB in the last 72 hours. Anemia Panel:  Recent Labs  02/13/17 1627  RETICCTPCT 3.9*   Urine analysis:    Component Value Date/Time   COLORURINE YELLOW 02/13/2017 1725   APPEARANCEUR HAZY (A) 02/13/2017 1725   LABSPEC 1.012 02/13/2017 1725   PHURINE 5.0 02/13/2017 1725   GLUCOSEU NEGATIVE 02/13/2017 1725   HGBUR NEGATIVE 02/13/2017 1725   BILIRUBINUR NEGATIVE 02/13/2017 1725   KETONESUR NEGATIVE 02/13/2017 1725   PROTEINUR NEGATIVE 02/13/2017 1725   UROBILINOGEN 0.2 09/24/2010 1310   NITRITE NEGATIVE 02/13/2017 1725   LEUKOCYTESUR NEGATIVE 02/13/2017 1725   Sepsis Labs: @LABRCNTIP (procalcitonin:4,lacticidven:4) )No results found for this or any previous visit (from the past 240 hour(s)).     UA  no evidence of UTI     No results found for: HGBA1C  Estimated Creatinine Clearance: 21.6 mL/min (A) (by C-G formula based on SCr of 1.9 mg/dL (H)).  BNP (last 3 results) No results for input(s): PROBNP in the last 8760 hours.   ECG REPORT  Independently reviewed Rate:79  Rhythm: Sinus rhythm ST&T Change:  ST segment depressions in multiple leads QTC  [place]   02/13/17 1420  Weight: 68 kg (150 lb)     Cultures: No results found for: SDES, SPECREQUEST, CULT, REPTSTATUS   Radiological Exams on Admission: Ct Abdomen Pelvis Wo Contrast  Result Date: 02/13/2017 CLINICAL DATA:  Anemia, generalized weakness, back pain, history hypertension, just began new blood pressure medicine, hypercholesterolemia, former smoker, assessment for malignancy EXAM: CT CHEST, ABDOMEN AND PELVIS WITHOUT CONTRAST TECHNIQUE: Multidetector CT imaging of the chest, abdomen and pelvis was performed following the standard protocol without IV contrast. Sagittal and coronal MPR images reconstructed from axial data set. No oral contrast administered. COMPARISON:  CT abdomen and pelvis 12/30/2008 FINDINGS: CT CHEST FINDINGS Cardiovascular: Atherosclerotic calcifications aorta, coronary arteries and proximal great vessels. Aorta normal caliber. No pericardial effusion or definite cardiac abnormality Mediastinum/Nodes: Visualized base of cervical region normal appearance. No thoracic adenopathy. Esophagus unremarkable Lungs/Pleura: Minimal atelectasis versus scarring at base of RIGHT middle lobe medially. No acute infiltrate, pleural effusion or pneumothorax. Tiny noncalcified nodules throughout both lungs 3 mm diameter or less, nonspecific. No dominant mass or nodule identified. Musculoskeletal: Osseous demineralization. CT ABDOMEN PELVIS FINDINGS Hepatobiliary: Question gallstones within gallbladder. Liver unremarkable. CBD minimally prominent 9 mm diameter. Pancreas: Normal appearance Spleen: Normal appearance. Probable small splenule adjacent to spleen. Adrenals/Urinary Tract: Interval RIGHT adrenalectomy and resection of large myelolipoma. Dense calcification within LEFT adrenal gland representing either prior hemorrhage or infection. Cyst anterior aspect upper pole LEFT kidney 2.3 x 2.1 cm increased since previous exam.  Kidneys, ureters, and bladder otherwise normal appearance. Stomach/Bowel: Normal appendix. Sigmoid diverticulosis without evidence of diverticulitis. Stomach and bowel loops otherwise normal appearance. Vascular/Lymphatic: Extensive atherosclerotic calcification aorta and branch vessels. Upper normal caliber mid abdominal aorta 2.8 x 2.6 cm image 66. Reproductive: Uterus surgically absent. Question atrophic RIGHT ovary with nonvisualization of LEFT ovary. Other: No free air or free fluid. No inflammatory process or hernia. Musculoskeletal: Scattered degenerative changes of the thoracolumbar spine with disc space obliteration at L4-L5 with grade 2 spondylolisthesis and question fusion L4-L5. IMPRESSION: Tiny BILATERAL pulmonary nodules 3 mm diameter or less, nonspecific and of uncertain etiology, recommendation below. No follow-up needed if patient is low-risk (and has no known or suspected primary neoplasm). Non-contrast chest CT can be considered in 12 months if patient is high-risk. This recommendation follows the consensus statement: Guidelines for Management of Incidental Pulmonary Nodules Detected on CT Images: From the Fleischner Society 2017; Radiology 2017; 284:228-243. Extensive atherosclerotic disease including aortic atherosclerosis and coronary arterial calcification. Question cholelithiasis. Sigmoid diverticulosis without evidence of diverticulitis. Interval RIGHT adrenalectomy with evidence of prior LEFT adrenal hemorrhage or infection. Electronically Signed   By: Lavonia Dana M.D.   On: 02/13/2017 17:28   Ct Chest Wo Contrast  Result Date: 02/13/2017 CLINICAL DATA:  Anemia, generalized weakness, back pain, history hypertension, just began new blood pressure medicine, hypercholesterolemia, former smoker, assessment for malignancy EXAM: CT CHEST, ABDOMEN AND PELVIS WITHOUT CONTRAST TECHNIQUE: Multidetector CT imaging of the chest, abdomen and pelvis was performed following the standard protocol  without IV contrast. Sagittal and coronal MPR images reconstructed from axial data set. No oral contrast administered. COMPARISON:  CT abdomen and pelvis 12/30/2008 FINDINGS: CT CHEST FINDINGS Cardiovascular: Atherosclerotic calcifications aorta, coronary arteries and proximal great vessels. Aorta normal caliber. No pericardial effusion or definite cardiac abnormality Mediastinum/Nodes: Visualized base of cervical region normal appearance. No thoracic adenopathy. Esophagus unremarkable Lungs/Pleura: Minimal atelectasis versus scarring at base of RIGHT middle lobe medially. No acute infiltrate, pleural effusion or pneumothorax. Tiny noncalcified nodules  throughout both lungs 3 mm diameter or less, nonspecific. No dominant mass or nodule identified. Musculoskeletal: Osseous demineralization. CT ABDOMEN PELVIS FINDINGS Hepatobiliary: Question gallstones within gallbladder. Liver unremarkable. CBD minimally prominent 9 mm diameter. Pancreas: Normal appearance Spleen: Normal appearance. Probable small splenule adjacent to spleen. Adrenals/Urinary Tract: Interval RIGHT adrenalectomy and resection of large myelolipoma. Dense calcification within LEFT adrenal gland representing either prior hemorrhage or infection. Cyst anterior aspect upper pole LEFT kidney 2.3 x 2.1 cm increased since previous exam. Kidneys, ureters, and bladder otherwise normal appearance. Stomach/Bowel: Normal appendix. Sigmoid diverticulosis without evidence of diverticulitis. Stomach and bowel loops otherwise normal appearance. Vascular/Lymphatic: Extensive atherosclerotic calcification aorta and branch vessels. Upper normal caliber mid abdominal aorta 2.8 x 2.6 cm image 66. Reproductive: Uterus surgically absent. Question atrophic RIGHT ovary with nonvisualization of LEFT ovary. Other: No free air or free fluid. No inflammatory process or hernia. Musculoskeletal: Scattered degenerative changes of the thoracolumbar spine with disc space obliteration  at L4-L5 with grade 2 spondylolisthesis and question fusion L4-L5. IMPRESSION: Tiny BILATERAL pulmonary nodules 3 mm diameter or less, nonspecific and of uncertain etiology, recommendation below. No follow-up needed if patient is low-risk (and has no known or suspected primary neoplasm). Non-contrast chest CT can be considered in 12 months if patient is high-risk. This recommendation follows the consensus statement: Guidelines for Management of Incidental Pulmonary Nodules Detected on CT Images: From the Fleischner Society 2017; Radiology 2017; 284:228-243. Extensive atherosclerotic disease including aortic atherosclerosis and coronary arterial calcification. Question cholelithiasis. Sigmoid diverticulosis without evidence of diverticulitis. Interval RIGHT adrenalectomy with evidence of prior LEFT adrenal hemorrhage or infection. Electronically Signed   By: Lavonia Dana M.D.   On: 02/13/2017 17:28    Chart has been reviewed    Assessment/Plan  81 y.o. female with medical history significant of left subclavian stenosis, carotid disease sp left carotid endarterectomy HTN, hypercholesterolemia presents with symptomatic anemia and transfusion and abnormal EKG  Present on Admission: . AKI (acute kidney injury) ([place]) unsure what the baseline   administered gentle IV fluids check urine electrolyte . Symptomatic anemia will transfuse 2 units appears to be due to iron deficiency related will discuss this GI would be slow GI blood loss Hemoccult negative . Abnormal ECG endorse any shortness of breath no chest pain will continue to cycle cardiac enzymes and monitor on telemetry . Essential hypertension continue home medications Leukocytosis obtain CBC with differential at this point no evidence of infectious process continue to monitor Elevated B-12 level will hold B-12 supplementation Other plan as per orders.  DVT prophylaxis:  SCD    Code Status:   DNR/DNI  As per patient   Family Communication:    Family  at  Bedside  plan of care was discussed with  Daughter,   Disposition Plan:     To home once workup is complete and patient is stable                        Would benefit from PT/OT eval prior to DC   ordered                                             Consults called: none  Admission status:    obs   Level of care    tele         I have spent a total  of 56 min on this admission   Sasan Wilkie 02/13/2017, 9:23 PM     [facility]  Pager [phone]   after 2 AM please page floor coverage PA If 7AM-7PM, please contact the day team taking care of the patient  [email or web address]  Password [email or web address]

## 2017-02-13 NOTE — ED Notes (Signed)
Gave pt water, per Shawn - PA.

## 2017-02-13 NOTE — Progress Notes (Signed)
Report received from ER RN. Room ready for admit.

## 2017-02-14 DIAGNOSIS — D649 Anemia, unspecified: Secondary | ICD-10-CM | POA: Diagnosis not present

## 2017-02-14 DIAGNOSIS — N179 Acute kidney failure, unspecified: Secondary | ICD-10-CM | POA: Diagnosis not present

## 2017-02-14 DIAGNOSIS — K633 Ulcer of intestine: Secondary | ICD-10-CM | POA: Diagnosis not present

## 2017-02-14 LAB — BPAM RBC
Blood Product Expiration Date: 201804262359
Blood Product Expiration Date: 201804272359
ISSUE DATE / TIME: 201804091819
ISSUE DATE / TIME: 201804092224
UNIT TYPE AND RH: 8400
Unit Type and Rh: 8400

## 2017-02-14 LAB — COMPREHENSIVE METABOLIC PANEL
ALBUMIN: 2.5 g/dL — AB (ref 3.5–5.0)
ALK PHOS: 97 U/L (ref 38–126)
ALT: 10 U/L — AB (ref 14–54)
AST: 11 U/L — AB (ref 15–41)
Anion gap: 11 (ref 5–15)
BUN: 57 mg/dL — AB (ref 6–20)
CALCIUM: 8.8 mg/dL — AB (ref 8.9–10.3)
CHLORIDE: 101 mmol/L (ref 101–111)
CO2: 22 mmol/L (ref 22–32)
CREATININE: 1.66 mg/dL — AB (ref 0.44–1.00)
GFR calc non Af Amer: 28 mL/min — ABNORMAL LOW (ref >60)
GFR, EST AFRICAN AMERICAN: 33 mL/min — AB (ref >60)
GLUCOSE: 98 mg/dL (ref 65–99)
Potassium: 4.2 mmol/L (ref 3.5–5.1)
SODIUM: 134 mmol/L — AB (ref 135–145)
Total Bilirubin: 0.9 mg/dL (ref 0.3–1.2)
Total Protein: 6.4 g/dL — ABNORMAL LOW (ref 6.5–8.1)

## 2017-02-14 LAB — CBC WITH DIFFERENTIAL/PLATELET
BASOS PCT: 0 %
Basophils Absolute: 0 10*3/uL (ref 0.0–0.1)
EOS ABS: 0.4 10*3/uL (ref 0.0–0.7)
EOS PCT: 4 %
HCT: 29.2 % — ABNORMAL LOW (ref 36.0–46.0)
HEMOGLOBIN: 9.3 g/dL — AB (ref 12.0–15.0)
LYMPHS ABS: 1.9 10*3/uL (ref 0.7–4.0)
Lymphocytes Relative: 19 %
MCH: 25.1 pg — AB (ref 26.0–34.0)
MCHC: 31.8 g/dL (ref 30.0–36.0)
MCV: 78.7 fL (ref 78.0–100.0)
MONO ABS: 1.1 10*3/uL — AB (ref 0.1–1.0)
Monocytes Relative: 11 %
Neutro Abs: 6.5 10*3/uL (ref 1.7–7.7)
Neutrophils Relative %: 66 %
Platelets: 325 10*3/uL (ref 150–400)
RBC: 3.71 MIL/uL — ABNORMAL LOW (ref 3.87–5.11)
RDW: 17.3 % — AB (ref 11.5–15.5)
WBC: 9.9 10*3/uL (ref 4.0–10.5)

## 2017-02-14 LAB — TYPE AND SCREEN
ABO/RH(D): AB POS
ANTIBODY SCREEN: NEGATIVE
UNIT DIVISION: 0
Unit division: 0

## 2017-02-14 LAB — TSH: TSH: 1.364 u[IU]/mL (ref 0.350–4.500)

## 2017-02-14 LAB — MAGNESIUM: Magnesium: 2.4 mg/dL (ref 1.7–2.4)

## 2017-02-14 LAB — TROPONIN I: Troponin I: 0.03 ng/mL (ref <0.03)

## 2017-02-14 LAB — PROTIME-INR
INR: 1.11
PROTHROMBIN TIME: 14.3 s (ref 11.4–15.2)

## 2017-02-14 LAB — PHOSPHORUS: Phosphorus: 4.1 mg/dL (ref 2.5–4.6)

## 2017-02-14 MED ORDER — PEG-KCL-NACL-NASULF-NA ASC-C 100 G PO SOLR
0.5000 | Freq: Once | ORAL | Status: AC
  Administered 2017-02-15: 0.5 g via ORAL

## 2017-02-14 MED ORDER — SODIUM CHLORIDE 0.9 % IV SOLN: INTRAVENOUS | Status: DC

## 2017-02-14 MED ORDER — PEG-KCL-NACL-NASULF-NA ASC-C 100 G PO SOLR: 1.0000 | Freq: Once | ORAL | Status: DC

## 2017-02-14 MED ORDER — PANTOPRAZOLE SODIUM 40 MG PO TBEC
40.0000 mg | DELAYED_RELEASE_TABLET | Freq: Every day | ORAL | Status: DC
Start: 2017-02-15 — End: 2017-02-15

## 2017-02-14 MED ORDER — PEG-KCL-NACL-NASULF-NA ASC-C 100 G PO SOLR
0.5000 | Freq: Once | ORAL | Status: DC
  Filled 2017-02-14: qty 1

## 2017-02-14 NOTE — Progress Notes (Signed)
Occupational Therapy Treatment Patient Details Name: Amy [name] MRN: [id number] DOB: [birth year] Today's Date: 02/14/2017    History of present illness PATIENT ADMITTED TO [facility] WITH LIGHTHEADEDNESS, ANEMIA, HGB 6.4 AND HAD BLOOD TRANSFUSION. PMH: L SUBCLAVIAN STENOSIS, L CARODID ENDARTERECTOMY, HTN, OA, BACK SX, DDD, CATARACT   OT comments  Pt. Is still feeling dizzy and was furniture walking in room. Pt. States she does currently feel unsafe to d/c home. Pt. Lives alone and does not have family in town. Pt. Is agreeable to [facility] for rehab. Pt. Has decreased I and safety with standing, ambulation, and ADLs. Pt. To be followed by acute OT.   Follow Up Recommendations  SNF    Equipment Recommendations  None recommended by OT    Recommendations for Other Services      Precautions / Restrictions Precautions Precautions: Fall Restrictions Weight Bearing Restrictions: No       Mobility Bed Mobility Overal bed mobility: Modified Independent                Transfers Overall transfer level: Needs assistance   Transfers: Sit to/from Stand;Stand Pivot Transfers Sit to Stand: Min guard Stand pivot transfers: Min guard            Balance                                           ADL either performed or assessed with clinical judgement   ADL Overall ADL's : Needs assistance/impaired Eating/Feeding: Independent   Grooming: Wash/dry face;Oral care;Supervision/safety;Standing   Upper Body Bathing: Supervision/ safety;Set up;Sitting   Lower Body Bathing: Min guard;Sit to/from stand   Upper Body Dressing : Supervision/safety;Set up;Sitting   Lower Body Dressing: Minimal assistance;Sit to/from stand   Toilet Transfer: Min guard;Ambulation   Toileting- Clothing Manipulation and Hygiene: Min guard;Sit to/from stand       Functional mobility during ADLs: Min guard General ADL Comments: Pt. states she is weak and is funiture walking.      Vision Baseline Vision/History: Wears glasses Wears Glasses: Reading only Patient Visual Report: No change from baseline Vision Assessment?: No apparent visual deficits   Perception     Praxis      Cognition Arousal/Alertness: Awake/alert Behavior During Therapy: WFL for tasks assessed/performed Overall Cognitive Status: Within Functional Limits for tasks assessed                                          Exercises     Shoulder Instructions       General Comments      Pertinent Vitals/ Pain       Pain Assessment: 0-10 Pain Score: 4  Pain Location:  (BACK)  Home Living Family/patient expects to be discharged to:: Private residence Living Arrangements: Alone Available Help at Discharge: Available PRN/intermittently Type of Home: House Home Access: Stairs to enter [employer] of Steps:  (2) Entrance Stairs-Rails: Right Home Layout: One level     Bathroom Shower/Tub: Teacher, early years/pre: Handicapped height     Home Equipment: Cane - single point;Shower seat;Toilet riser;Grab bars - toilet;Grab bars - tub/shower;Hand held shower head          Prior Functioning/Environment Level of Independence: Independent with assistive device(s)  Frequency  Min 2X/week        Progress Toward Goals  OT Goals(current goals can now be found in the care plan section)     Acute Rehab OT Goals Patient Stated Goal:  (get stronger) OT Goal Formulation: With patient/family Time For Goal Achievement: 02/28/17 Potential to Achieve Goals: Good ADL Goals Pt Will Perform Grooming: with modified independence;standing Pt Will Perform Upper Body Bathing: with modified independence;sitting Pt Will Perform Lower Body Bathing: with modified independence;sit to/from stand Pt Will Perform Upper Body Dressing: with modified independence;sitting Pt Will Perform Lower Body Dressing: with modified independence;sit to/from  stand Pt Will Transfer to Toilet: with modified independence;ambulating;regular height toilet;grab bars Pt Will Perform Toileting - Clothing Manipulation and hygiene: with modified independence;sit to/from stand Pt Will Perform Tub/Shower Transfer: Tub transfer;shower seat  Plan      Co-evaluation                 End of Session Equipment Utilized During Treatment: Gait belt  OT Visit Diagnosis: Unsteadiness on feet (R26.81);Muscle weakness (generalized) (M62.81);Pain Pain - part of body:  (back)   Activity Tolerance Patient tolerated treatment well   Patient Left in bed;with call bell/phone within reach;with family/visitor present   Nurse Communication      Functional Assessment Tool Used: AM-PAC 6 Clicks Daily Activity Functional Limitation: Mobility: Walking and moving around;Self care Mobility: Walking and Moving Around Current Status (R6789): At least 1 percent but less than 20 percent impaired, limited or restricted Mobility: Walking and Moving Around Goal Status [phone]): 0 percent impaired, limited or restricted Self Care Current Status (B5102): At least 1 percent but less than 20 percent impaired, limited or restricted Self Care Goal Status (H8527): 0 percent impaired, limited or restricted   Time: 1300-1353 OT Time Calculation (min): 53 min  Charges: OT G-codes **NOT FOR INPATIENT CLASS** Functional Assessment Tool Used: AM-PAC 6 Clicks Daily Activity Functional Limitation: Mobility: Walking and moving around;Self care Mobility: Walking and Moving Around Current Status (P8242): At least 1 percent but less than 20 percent impaired, limited or restricted Mobility: Walking and Moving Around Goal Status [phone]): 0 percent impaired, limited or restricted Self Care Current Status (W4315): At least 1 percent but less than 20 percent impaired, limited or restricted Self Care Goal Status (Q0086): 0 percent impaired, limited or restricted OT General Charges $OT Visit: 1  Procedure OT Evaluation $OT Eval Moderate Complexity: 1 Procedure OT Treatments $Self Care/Home Management : 76-19 mins   6 clicks - clinical judgement  Yoland Scherr 02/14/2017, 2:10 PM

## 2017-02-14 NOTE — Evaluation (Signed)
Physical Therapy Evaluation Patient Details Name: Amy [name] MRN: [id number] DOB: [birth year] Today's Date: 02/14/2017   History of Present Illness  Patient is a 81 yo female admitted 02/13/17 with fatigue, dizziness, and Hgb 6.4.  Patient with symptomatic anemia, and AKI.    PMH:  HTN, OA, back surgery, DDD, Lt CEA  Clinical Impression  Patient presents with problems listed below.  Will benefit from acute PT to maximize functional mobility prior to discharge.  Patient was functioning independently pta.  Today patient requiring min assist to stand, and unable to ambulate.  Recommend ST-SNF at d/c to return patient to at least Mod I level so that she can return home safely.    Follow Up Recommendations SNF;Supervision for mobility/OOB    Equipment Recommendations  Rolling walker with 5" wheels    Recommendations for Other Services       Precautions / Restrictions Precautions Precautions: Fall Precaution Comments: Lightheaded Restrictions Weight Bearing Restrictions: No      Mobility  Bed Mobility Overal bed mobility: Needs Assistance Bed Mobility: Supine to Sit;Sit to Supine     Supine to sit: Min assist Sit to supine: Modified independent (Device/Increase time)   General bed mobility comments: Increased time.  Assist to steady and for safety during transition.  Patient sat EOB x 4 minutes to allow lightheadedness to decrease.   Able to move to supine with no physical assist.  Transfers Overall transfer level: Needs assistance Equipment used: Rolling walker (2 wheeled) Transfers: Sit to/from Stand Sit to Stand: Min guard         General transfer comment: Verbal cues for hand placement.  Slow to stand.  Once upright, patient again lightheaded.  Able to stand x 60 seconds, and return to sitting.  Ambulation/Gait             General Gait Details: Patient unable  Stairs            Wheelchair Mobility    Modified Rankin (Stroke Patients Only)        Balance Overall balance assessment: Needs assistance Sitting-balance support: No upper extremity supported;Feet supported Sitting balance-Leahy Scale: Fair     Standing balance support: Bilateral upper extremity supported Standing balance-Leahy Scale: Poor                               Pertinent Vitals/Pain Pain Assessment: 0-10 Pain Score: 4  Pain Location: Back Pain Descriptors / Indicators: Aching;Sore Pain Intervention(s): Monitored during session;Repositioned    Home Living Family/patient expects to be discharged to:: Private residence Living Arrangements: Alone Available Help at Discharge: Family;Friend(s);Available PRN/intermittently Type of Home: House Home Access: Stairs to enter Entrance Stairs-Rails: Right Entrance Stairs-Number of Steps: 2 Home Layout: One level Home Equipment: Cane - single point;Shower seat;Toilet riser;Grab bars - toilet;Grab bars - tub/shower;Hand held shower head      Prior Function Level of Independence: Independent with assistive device(s)         Comments: Patient using cane for ambulation.     Hand Dominance   Dominant Hand: Right    Extremity/Trunk Assessment   Upper Extremity Assessment Upper Extremity Assessment: Generalized weakness    Lower Extremity Assessment Lower Extremity Assessment: Generalized weakness (Grossly 4-/5 for hip/knee movement and 4/5 DF)       Communication   Communication: HOH  Cognition Arousal/Alertness: Awake/alert Behavior During Therapy: WFL for tasks assessed/performed;Anxious Overall Cognitive Status: Within Functional Limits for tasks assessed  General Comments      Exercises     Assessment/Plan    PT Assessment Patient needs continued PT services  PT Problem List Decreased strength;Decreased activity tolerance;Decreased balance;Decreased mobility;Decreased knowledge of use of DME;Cardiopulmonary status  limiting activity       PT Treatment Interventions DME instruction;Gait training;Functional mobility training;Therapeutic activities;Therapeutic exercise;Balance training;Patient/family education    PT Goals (Current goals can be found in the Care Plan section)  Acute Rehab PT Goals Patient Stated Goal: To get stronger PT Goal Formulation: With patient Time For Goal Achievement: 02/21/17 Potential to Achieve Goals: Good    Frequency Min 3X/week   Barriers to discharge Decreased caregiver support Patient lives alone    Co-evaluation               End of Session Equipment Utilized During Treatment: Gait belt Activity Tolerance: Patient limited by fatigue (Limited by lightheadedness) Patient left: in bed;with call bell/phone within reach;with bed alarm set Nurse Communication: Mobility status PT Visit Diagnosis: Unsteadiness on feet (R26.81);Muscle weakness (generalized) (M62.81);Difficulty in walking, not elsewhere classified (R26.2)    Time: 5726-2035 PT Time Calculation (min) (ACUTE ONLY): 10 min   Charges:   PT Evaluation $PT Eval Moderate Complexity: 1 Procedure     PT G Codes:   PT G-Codes **NOT FOR INPATIENT CLASS** Functional Assessment Tool Used: AM-PAC 6 Clicks Basic Mobility;Clinical judgement Functional Limitation: Mobility: Walking and moving around Mobility: Walking and Moving Around Current Status (D9741): At least 40 percent but less than 60 percent impaired, limited or restricted Mobility: Walking and Moving Around Goal Status [phone]): At least 1 percent but less than 20 percent impaired, limited or restricted    Carita Pian. Sanjuana Kava, [facility] Acute Rehab Services Pager [phone]   Despina Pole 02/14/2017, 7:09 PM

## 2017-02-14 NOTE — Progress Notes (Signed)
Patient ID: Amy [name], female   DOB: 1936-02-15, 81 y.o.   MRN: [id number]    PROGRESS NOTE  Amy [name]  RCV:[id number] DOB: [birth year] DOA: 02/13/2017  PCP: Geoffery Lyons, MD   Brief Narrative:  81 y.o. female with left subclavian stenosis, carotid artery dz.  s/p left CEA.  HTN.  HLD, egenerative spinal and disc disease. Status post 3 separate surgeries on her low back, s/p right adrenal gland resection, 12/2009 Colonoscopy, screening.  Pedunculated polyp in the sigmoid colon - removed (tubular adenoma).  Moderate diverticulosis in the left colon, presented to [facility] with main concern of progressively worsening fatigue almost one week in duration. This has been associated with dizziness and poor oral intake. She was seen by primary care physician in the office, had blood work done and her hemoglobin was 6.4  Assessment & Plan:  Symptomatic acute blood loss anemia, IDA - SP PRBC x 2.  FOBT negative - Hg up from 6.4 --> 9.3 this AM and pt reports feeling better - GI consulted  - pt will need colonoscopy plus minus upper endoscopy - keep on clear liquids today - iron level 11 - appreciate GI team following   Acute kidney injury - in the setting of acute blood loss anemia - improving from 1.9 --> 1.66 this AM - repeat BMP in AM  HTN - continue Norvasc and Atenolol   Minimal elevation of trop - 0.03, no cheat pain and suspect this to be related to demand ischemia - pt with no CP this AM - would not cycle CE's at this point unless pt with CP  Leukocytosis, thrombocytosis  - possibly reactive - no signs of an infectious etiology - now resolved, WBC and Plt both WNL - repeat CBC in AM  DVT prophylaxis: SCD's Code Status: DNR Family Communication: Patient at bedside  Disposition Plan: once GI team clears, will go home  Consultants:   GI  Procedures:   None  Antimicrobials:   None  Subjective: No events overnight, pt reports feeling better.    Objective: Vitals:   02/13/17 2250 02/14/17 0115 02/14/17 0430 02/14/17 0902  BP: (!) 144/35 (!) 150/48 (!) 133/40 (!) 157/47  Pulse: 73 78 76 77  Resp: _0 Temp: 98 F (36.7 C) 98 F (36.7 C) 98.6 F (37 C) 98.3 F (36.8 C)  TempSrc: Oral Oral  Oral  SpO2: 99% 97% 97% 99%  Weight:      Height:        Intake/Output Summary (Last 24 hours) at 02/14/17 1209 Last data filed at 02/14/17 1000  Gross per 24 hour  Intake          1648.75 ml  Output             1325 ml  Net           323.75 ml   Filed Weights   02/13/17 1420 02/13/17 2122  Weight: 68 kg (150 lb) 67.1 kg (148 lb)    Examination:  General exam: Appears calm and comfortable  Respiratory system: Clear to auscultation. Respiratory effort normal. Cardiovascular system: S1 & S2 heard, RRR. No JVD, rubs, gallops or clicks.  Gastrointestinal system: Abdomen is nondistended, soft and nontender. No organomegaly or masses felt. Normal bowel sounds heard. Central nervous system: Alert and oriented. No focal neurological deficits. Extremities: Symmetric 5 x 5 power.  Data Reviewed: I have personally reviewed following labs and imaging studies  CBC:  Recent Labs Lab  02/13/17 1455 02/14/17 0257  WBC 12.8* 9.9  NEUTROABS  --  6.5  HGB 6.4* 9.3*  HCT 21.3* 29.2*  MCV 81.3 78.7  PLT 459* 572   Basic Metabolic Panel:  Recent Labs Lab 02/13/17 1455 02/14/17 0257  NA 134* 134*  K 4.2 4.2  CL 96* 101  CO2 24 22  GLUCOSE 135* 98  BUN 64* 57*  CREATININE 1.90* 1.66*  CALCIUM 9.4 8.8*  MG  --  2.4  PHOS  --  4.1   Liver Function Tests:  Recent Labs Lab 02/13/17 2135 02/14/17 0257  AST 13* 11*  ALT 12* 10*  ALKPHOS 110 97  BILITOT 0.4 0.9  PROT 6.9 6.4*  ALBUMIN 2.6* 2.5*   Coagulation Profile:  Recent Labs Lab 02/14/17 0257  INR 1.11   Cardiac Enzymes:  Recent Labs Lab 02/13/17 1503 02/13/17 2135 02/14/17 0257  TROPONINI 0.03* <0.03 0.03*   CBG:  Recent Labs Lab  02/13/17 1640  GLUCAP 117*   Thyroid Function Tests:  Recent Labs  02/14/17 0257  TSH 1.364   Anemia Panel:  Recent Labs  02/13/17 1627  VITAMINB12 >7,500*  FOLATE 15.1  FERRITIN 31  TIBC 322  IRON 11*  RETICCTPCT 3.9*   Urine analysis:    Component Value Date/Time   COLORURINE YELLOW 02/13/2017 1725   APPEARANCEUR HAZY (A) 02/13/2017 1725   LABSPEC 1.012 02/13/2017 1725   PHURINE 5.0 02/13/2017 1725   GLUCOSEU NEGATIVE 02/13/2017 1725   HGBUR NEGATIVE 02/13/2017 1725   BILIRUBINUR NEGATIVE 02/13/2017 1725   KETONESUR NEGATIVE 02/13/2017 1725   PROTEINUR NEGATIVE 02/13/2017 1725   UROBILINOGEN 0.2 09/24/2010 1310   NITRITE NEGATIVE 02/13/2017 1725   LEUKOCYTESUR NEGATIVE 02/13/2017 1725   Radiology Studies: Ct Abdomen Pelvis Wo Contrast  Result Date: 02/13/2017 CLINICAL DATA:  Anemia, generalized weakness, back pain, history hypertension, just began new blood pressure medicine, hypercholesterolemia, former smoker, assessment for malignancy EXAM: CT CHEST, ABDOMEN AND PELVIS WITHOUT CONTRAST TECHNIQUE: Multidetector CT imaging of the chest, abdomen and pelvis was performed following the standard protocol without IV contrast. Sagittal and coronal MPR images reconstructed from axial data set. No oral contrast administered. COMPARISON:  CT abdomen and pelvis 12/30/2008 FINDINGS: CT CHEST FINDINGS Cardiovascular: Atherosclerotic calcifications aorta, coronary arteries and proximal great vessels. Aorta normal caliber. No pericardial effusion or definite cardiac abnormality Mediastinum/Nodes: Visualized base of cervical region normal appearance. No thoracic adenopathy. Esophagus unremarkable Lungs/Pleura: Minimal atelectasis versus scarring at base of RIGHT middle lobe medially. No acute infiltrate, pleural effusion or pneumothorax. Tiny noncalcified nodules throughout both lungs 3 mm diameter or less, nonspecific. No dominant mass or nodule identified. Musculoskeletal: Osseous  demineralization. CT ABDOMEN PELVIS FINDINGS Hepatobiliary: Question gallstones within gallbladder. Liver unremarkable. CBD minimally prominent 9 mm diameter. Pancreas: Normal appearance Spleen: Normal appearance. Probable small splenule adjacent to spleen. Adrenals/Urinary Tract: Interval RIGHT adrenalectomy and resection of large myelolipoma. Dense calcification within LEFT adrenal gland representing either prior hemorrhage or infection. Cyst anterior aspect upper pole LEFT kidney 2.3 x 2.1 cm increased since previous exam. Kidneys, ureters, and bladder otherwise normal appearance. Stomach/Bowel: Normal appendix. Sigmoid diverticulosis without evidence of diverticulitis. Stomach and bowel loops otherwise normal appearance. Vascular/Lymphatic: Extensive atherosclerotic calcification aorta and branch vessels. Upper normal caliber mid abdominal aorta 2.8 x 2.6 cm image 66. Reproductive: Uterus surgically absent. Question atrophic RIGHT ovary with nonvisualization of LEFT ovary. Other: No free air or free fluid. No inflammatory process or hernia. Musculoskeletal: Scattered degenerative changes of the thoracolumbar spine with disc space  obliteration at L4-L5 with grade 2 spondylolisthesis and question fusion L4-L5. IMPRESSION: Tiny BILATERAL pulmonary nodules 3 mm diameter or less, nonspecific and of uncertain etiology, recommendation below. No follow-up needed if patient is low-risk (and has no known or suspected primary neoplasm). Non-contrast chest CT can be considered in 12 months if patient is high-risk. This recommendation follows the consensus statement: Guidelines for Management of Incidental Pulmonary Nodules Detected on CT Images: From the Fleischner Society 2017; Radiology 2017; 284:228-243. Extensive atherosclerotic disease including aortic atherosclerosis and coronary arterial calcification. Question cholelithiasis. Sigmoid diverticulosis without evidence of diverticulitis. Interval RIGHT adrenalectomy  with evidence of prior LEFT adrenal hemorrhage or infection. Electronically Signed   By: Lavonia Dana M.D.   On: 02/13/2017 17:28   Ct Chest Wo Contrast  Result Date: 02/13/2017 CLINICAL DATA:  Anemia, generalized weakness, back pain, history hypertension, just began new blood pressure medicine, hypercholesterolemia, former smoker, assessment for malignancy EXAM: CT CHEST, ABDOMEN AND PELVIS WITHOUT CONTRAST TECHNIQUE: Multidetector CT imaging of the chest, abdomen and pelvis was performed following the standard protocol without IV contrast. Sagittal and coronal MPR images reconstructed from axial data set. No oral contrast administered. COMPARISON:  CT abdomen and pelvis 12/30/2008 FINDINGS: CT CHEST FINDINGS Cardiovascular: Atherosclerotic calcifications aorta, coronary arteries and proximal great vessels. Aorta normal caliber. No pericardial effusion or definite cardiac abnormality Mediastinum/Nodes: Visualized base of cervical region normal appearance. No thoracic adenopathy. Esophagus unremarkable Lungs/Pleura: Minimal atelectasis versus scarring at base of RIGHT middle lobe medially. No acute infiltrate, pleural effusion or pneumothorax. Tiny noncalcified nodules throughout both lungs 3 mm diameter or less, nonspecific. No dominant mass or nodule identified. Musculoskeletal: Osseous demineralization. CT ABDOMEN PELVIS FINDINGS Hepatobiliary: Question gallstones within gallbladder. Liver unremarkable. CBD minimally prominent 9 mm diameter. Pancreas: Normal appearance Spleen: Normal appearance. Probable small splenule adjacent to spleen. Adrenals/Urinary Tract: Interval RIGHT adrenalectomy and resection of large myelolipoma. Dense calcification within LEFT adrenal gland representing either prior hemorrhage or infection. Cyst anterior aspect upper pole LEFT kidney 2.3 x 2.1 cm increased since previous exam. Kidneys, ureters, and bladder otherwise normal appearance. Stomach/Bowel: Normal appendix. Sigmoid  diverticulosis without evidence of diverticulitis. Stomach and bowel loops otherwise normal appearance. Vascular/Lymphatic: Extensive atherosclerotic calcification aorta and branch vessels. Upper normal caliber mid abdominal aorta 2.8 x 2.6 cm image 66. Reproductive: Uterus surgically absent. Question atrophic RIGHT ovary with nonvisualization of LEFT ovary. Other: No free air or free fluid. No inflammatory process or hernia. Musculoskeletal: Scattered degenerative changes of the thoracolumbar spine with disc space obliteration at L4-L5 with grade 2 spondylolisthesis and question fusion L4-L5. IMPRESSION: Tiny BILATERAL pulmonary nodules 3 mm diameter or less, nonspecific and of uncertain etiology, recommendation below. No follow-up needed if patient is low-risk (and has no known or suspected primary neoplasm). Non-contrast chest CT can be considered in 12 months if patient is high-risk. This recommendation follows the consensus statement: Guidelines for Management of Incidental Pulmonary Nodules Detected on CT Images: From the Fleischner Society 2017; Radiology 2017; 284:228-243. Extensive atherosclerotic disease including aortic atherosclerosis and coronary arterial calcification. Question cholelithiasis. Sigmoid diverticulosis without evidence of diverticulitis. Interval RIGHT adrenalectomy with evidence of prior LEFT adrenal hemorrhage or infection. Electronically Signed   By: Lavonia Dana M.D.   On: 02/13/2017 17:28    Scheduled Meds: . amLODipine  10 mg Oral Daily  . atenolol  50 mg Oral Daily  . loratadine  10 mg Oral Daily  . [START ON 02/15/2017] pantoprazole  40 mg Oral Q0600  . peg 3350 powder  0.5  kit Oral Once   And  . [START ON 02/15/2017] peg 3350 powder  0.5 kit Oral Once  . senna  1 tablet Oral BID  . sodium chloride flush  3 mL Intravenous Q12H   Continuous Infusions:   LOS: 0 days   Time spent: 20 minutes   Faye Ramsay, MD [facility] Pager [phone]  If  7PM-7AM, please contact night-coverage [email or web address] Password [email or web address] 02/14/2017, 12:09 PM

## 2017-02-14 NOTE — Consult Note (Signed)
[facility] Consult: 9:32 AM 02/14/2017  LOS: 0 days    Referring Provider: Dr Doyle Askew Primary Care Physician:  Geoffery Lyons, MD Primary Gastroenterologist:  Dr. Henrene Pastor     Reason for Consultation:  Anemia.     HPI: Amy [name] is a 81 y.o. female.  PMH left subclavian stenosis, carotid artery dz.  s/p left CEA.  HTN.  HLD.  Degenerative spinal and disc disease. Status post 3 separate surgeries on her low back.  s/p right adrenal gland resection.  12/2009 Colonoscopy, screening.  Pedunculated polyp in the sigmoid colon - removed (tubular adenoma).  Moderate diverticulosis in the left colon Recall letters sent 12/2014  5 days of profound fatigue/weakness. Hardly able to get up and move around her house without getting dizzy and very weak. She attributed symptoms to blood pressure medications. For several months she has had medications added and adjusted for management of hypertension.  Seen at [place] office yesterday. She was anemic with hemoglobin of 6.4. She was sent to the emergency department for evaluation and admission.  Patient has had many years of stable bowel movements which are brown, formed somewhat difficult to pass but she has never seen even scant amounts of blood. She has never had melena. Her appetite is good. Weight is stable. No reflux symptoms noted. No dysphagia.  No abdominal pain, bloating or distress of any sort. No previous issues with anemia or need for supplemental iron.  She does take vitamin B-12 daily at home.  Patient takes a low-dose aspirin and diclofenac daily.  Repeat labs in ED with Hgb 6.4 >>  2 units PRBC>> 9.3.  MCV 81.  Previous Hgb 11.5 in 09/2010.  Iron 11, Ferritin 31.  Coags normal.  Stool FOBT.  TSH normal.    CT chest/ab/pelvis: Shows tiny pulmonary nodules, nonspecific.  Extensive atherosclerotic disease involving the aorta and coronary arteries. Question cholelithiasis. Mildly prominent, 9 mm diameter CBD.  Probable small  splenule.  Sigmoid diverticulosis.     Past Medical History:  Diagnosis Date  . Arthritis   . HTN (hypertension)   . Hypercholesterolemia     Past Surgical History:  Procedure Laterality Date  . BACK SURGERY    . CARDIAC CATHETERIZATION  01-02-2009   The anterior descending artery is moderate  in size. There is an eccentric 60-70% proximal stenosis followed by 50-60% mid stenosis. EF 75-80%  . CARDIOVASCULAR STRESS TEST  12-26-2008   EF 65%  . CATARACT EXTRACTION    . CYSTECTOMY     Right Hand  . HEMORROIDECTOMY    . TONSILLECTOMY    . TOTAL ABDOMINAL HYSTERECTOMY      Prior to Admission medications   Medication Sig Start Date End Date Taking? Authorizing Provider  amLODipine (NORVASC) 10 MG tablet Take 10 mg by mouth daily.   Yes Historical Provider, MD  aspirin 81 MG tablet Take 81 mg by mouth daily.   Yes Historical Provider, MD  atenolol (TENORMIN) 50 MG tablet Take 50 mg by mouth daily.   Yes Historical Provider, MD  CALCIUM-VITAMIN D  PO Take 1 tablet by mouth 2 (two) times daily.   Yes Historical Provider, MD  cholecalciferol (VITAMIN D) 400 units TABS tablet Take 400 Units by mouth daily.   Yes Historical Provider, MD  Diclofenac Sodium CR 100 MG 24 hr tablet Take 100 mg by mouth daily.   Yes Historical Provider, MD  fluticasone (FLONASE) 50 MCG/ACT nasal spray Place 2 sprays into both nostrils at bedtime.   Yes Historical Provider, MD  HYDROcodone-acetaminophen (NORCO/VICODIN) 5-325 MG tablet Take 1 tablet by mouth every 6 (six) hours as needed for moderate pain.   Yes Historical Provider, MD  loratadine (CLARITIN) 10 MG tablet Take 10 mg by mouth daily.   Yes Historical Provider, MD  meclizine (ANTIVERT) 25 MG tablet Take 25 mg by mouth 3 (three) times daily as needed for dizziness.   Yes Historical Provider, MD    Multiple Vitamin (MULTIVITAMIN WITH MINERALS) TABS tablet Take 1 tablet by mouth daily.   Yes Historical Provider, MD  nitroGLYCERIN (NITROSTAT) 0.4 MG SL tablet Place 0.4 mg under the tongue every 5 (five) minutes as needed for chest pain.    Yes Historical Provider, MD  telmisartan-hydrochlorothiazide (MICARDIS HCT) 80-25 MG tablet Take 1 tablet by mouth daily.   Yes Historical Provider, MD  vitamin B-12 (CYANOCOBALAMIN) 1000 MCG tablet Take 1,000 mcg by mouth daily.    Yes Historical Provider, MD  vitamin E 400 UNIT capsule Take 400 Units by mouth daily.   Yes Historical Provider, MD    Scheduled Meds: . amLODipine  10 mg Oral Daily  . atenolol  50 mg Oral Daily  . loratadine  10 mg Oral Daily  . pantoprazole (PROTONIX) IV  40 mg Intravenous Q12H  . senna  1 tablet Oral BID  . sodium chloride flush  3 mL Intravenous Q12H   Infusions:  PRN Meds: acetaminophen **OR** acetaminophen, bisacodyl, HYDROcodone-acetaminophen, ondansetron **OR** ondansetron (ZOFRAN) IV, polyethylene glycol   Allergies as of 02/13/2017 - Review Complete 02/13/2017  Allergen Reaction Noted  . Codeine Nausea And Vomiting 12/02/2009  . Sulfonamide derivatives Itching and Rash 12/02/2009    Family History  Problem Relation Age of Onset  . Diabetes Father   . Diabetes Other   . CAD Neg Hx   . Stroke Neg Hx   . Cancer Neg Hx     Social History   Social History  . Marital status: Widowed    Spouse name: N/A  . Number of children: N/A  . Years of education: N/A   Occupational History  . Not on file.   Social History Main Topics  . Smoking status: Former Research scientist (life sciences)  . Smokeless tobacco: Never Used     Comment: Quite 2009  . Alcohol use No  . Drug use: No  . Sexual activity: Not on file   Other Topics Concern  . Not on file   Social History Narrative  . No narrative on file    REVIEW OF SYSTEMS: Constitutional:  Per HPI ENT:  No nose bleeds Pulm:  Some DOE.  No cough CV:  No  palpitations, no LE edema.  GU:  No hematuria, no frequency GI:  Per HPI Heme:  No excessive bruising or bleeding.   Transfusions:  None ever before last night Neuro:  No headaches, no peripheral tingling or numbness Derm:  No itching, no rash or sores.  Endocrine:  No sweats or chills.  No polyuria or dysuria Immunization:  Up to date with flu and pnemovax.  Travel:  None  beyond local counties in last few months.    PHYSICAL EXAM: Vital signs in last 24 hours: Vitals:   02/14/17 0430 02/14/17 0902  BP: (!) 133/40 (!) 157/47  Pulse: 76 77  Resp: 16 17  Temp: 98.6 F (37 C) 98.3 F (36.8 C)   Wt Readings from Last 3 Encounters:  02/13/17 67.1 kg (148 lb)  11/10/16 67.1 kg (148 lb)    General: Pleasant, comfortable, calm elderly WF Head:  No asymmetry, no signs of head trauma. No facial edema.  Eyes:  No scleral icterus. No conjunctival pallor. EOMI. Ears:  No hearing deficit  Nose:  No congestion or discharge Mouth:  Tongue midline. Oral mucosa moist and clear. Good dental condition. Neck:  No JVD, no masses, no thyromegaly. Lungs:  Clear bilaterally. No cough. No dyspnea. Heart: RRR. No MRG. S1, S2 present. Abdomen:  Soft. Not tender. No masses. No HSM. No bruits or hernias. No distention..   Rectal: Did not repeat rectal exam performed yesterday when the stool was FOBT negative   Musc/Skeltl: No joint redness, swelling or gross deformity. Extremities:  No CCE.  Neurologic:  Alert. Oriented times 3. Good historian. No limb weakness. No tremor. Skin:  No rashes, sores, telangiectasia. Tattoos:  None Nodes:  No cervical adenopathy.   Psych:  Calm, cooperative, pleasant.  Intake/Output from previous day: 04/09 0701 - 04/10 0700 In: 1648.8 [P.O.:120; I.V.:453.8; Blood:1075] Out: 1325 [CVELF:8101] Intake/Output this shift: No intake/output data recorded.  LAB RESULTS:  Recent Labs  02/13/17 1455 02/14/17 0257  WBC 12.8* 9.9  HGB 6.4* 9.3*  HCT 21.3* 29.2*   PLT 459* 325   BMET Lab Results  Component Value Date   NA 134 (L) 02/14/2017   NA 134 (L) 02/13/2017   NA 139 09/29/2010   K 4.2 02/14/2017   K 4.2 02/13/2017   K 3.8 09/29/2010   CL 101 02/14/2017   CL 96 (L) 02/13/2017   CL 104 09/29/2010   CO2 22 02/14/2017   CO2 24 02/13/2017   CO2 30 09/29/2010   GLUCOSE 98 02/14/2017   GLUCOSE 135 (H) 02/13/2017   GLUCOSE 144 (H) 09/29/2010   BUN 57 (H) 02/14/2017   BUN 64 (H) 02/13/2017   BUN 10 09/29/2010   CREATININE 1.66 (H) 02/14/2017   CREATININE 1.90 (H) 02/13/2017   CREATININE 0.81 09/29/2010   CALCIUM 8.8 (L) 02/14/2017   CALCIUM 9.4 02/13/2017   CALCIUM 8.7 09/29/2010   LFT  Recent Labs  02/13/17 2135 02/14/17 0257  PROT 6.9 6.4*  ALBUMIN 2.6* 2.5*  AST 13* 11*  ALT 12* 10*  ALKPHOS 110 97  BILITOT 0.4 0.9  BILIDIR 0.1  --   IBILI 0.3  --    PT/INR Lab Results  Component Value Date   INR 1.11 02/14/2017   INR 0.89 09/24/2010   INR 0.9 02/05/2009   Hepatitis Panel No results for input(s): HEPBSAG, HCVAB, HEPAIGM, HEPBIGM in the last 72 hours. C-Diff No components found for: CDIFF Lipase  No results found for: LIPASE  Drugs of Abuse  No results found for: LABOPIA, COCAINSCRNUR, LABBENZ, AMPHETMU, THCU, LABBARB   RADIOLOGY STUDIES: Ct Abdomen Pelvis Wo Contrast  Result Date: 02/13/2017 CLINICAL DATA:  Anemia, generalized weakness, back pain, history hypertension, just began new blood pressure medicine, hypercholesterolemia, former smoker, assessment for malignancy EXAM: CT CHEST, ABDOMEN AND PELVIS WITHOUT CONTRAST TECHNIQUE: Multidetector CT imaging of the chest, abdomen and pelvis was performed following the standard protocol without IV contrast. Sagittal and coronal  MPR images reconstructed from axial data set. No oral contrast administered. COMPARISON:  CT abdomen and pelvis 12/30/2008 FINDINGS: CT CHEST FINDINGS Cardiovascular: Atherosclerotic calcifications aorta, coronary arteries and proximal  great vessels. Aorta normal caliber. No pericardial effusion or definite cardiac abnormality Mediastinum/Nodes: Visualized base of cervical region normal appearance. No thoracic adenopathy. Esophagus unremarkable Lungs/Pleura: Minimal atelectasis versus scarring at base of RIGHT middle lobe medially. No acute infiltrate, pleural effusion or pneumothorax. Tiny noncalcified nodules throughout both lungs 3 mm diameter or less, nonspecific. No dominant mass or nodule identified. Musculoskeletal: Osseous demineralization. CT ABDOMEN PELVIS FINDINGS Hepatobiliary: Question gallstones within gallbladder. Liver unremarkable. CBD minimally prominent 9 mm diameter. Pancreas: Normal appearance Spleen: Normal appearance. Probable small splenule adjacent to spleen. Adrenals/Urinary Tract: Interval RIGHT adrenalectomy and resection of large myelolipoma. Dense calcification within LEFT adrenal gland representing either prior hemorrhage or infection. Cyst anterior aspect upper pole LEFT kidney 2.3 x 2.1 cm increased since previous exam. Kidneys, ureters, and bladder otherwise normal appearance. Stomach/Bowel: Normal appendix. Sigmoid diverticulosis without evidence of diverticulitis. Stomach and bowel loops otherwise normal appearance. Vascular/Lymphatic: Extensive atherosclerotic calcification aorta and branch vessels. Upper normal caliber mid abdominal aorta 2.8 x 2.6 cm image 66. Reproductive: Uterus surgically absent. Question atrophic RIGHT ovary with nonvisualization of LEFT ovary. Other: No free air or free fluid. No inflammatory process or hernia. Musculoskeletal: Scattered degenerative changes of the thoracolumbar spine with disc space obliteration at L4-L5 with grade 2 spondylolisthesis and question fusion L4-L5. IMPRESSION: Tiny BILATERAL pulmonary nodules 3 mm diameter or less, nonspecific and of uncertain etiology, recommendation below. No follow-up needed if patient is low-risk (and has no known or suspected primary  neoplasm). Non-contrast chest CT can be considered in 12 months if patient is high-risk. This recommendation follows the consensus statement: Guidelines for Management of Incidental Pulmonary Nodules Detected on CT Images: From the Fleischner Society 2017; Radiology 2017; 284:228-243. Extensive atherosclerotic disease including aortic atherosclerosis and coronary arterial calcification. Question cholelithiasis. Sigmoid diverticulosis without evidence of diverticulitis. Interval RIGHT adrenalectomy with evidence of prior LEFT adrenal hemorrhage or infection. Electronically Signed   By: Lavonia Dana M.D.   On: 02/13/2017 17:28   Ct Chest Wo Contrast  Result Date: 02/13/2017 CLINICAL DATA:  Anemia, generalized weakness, back pain, history hypertension, just began new blood pressure medicine, hypercholesterolemia, former smoker, assessment for malignancy EXAM: CT CHEST, ABDOMEN AND PELVIS WITHOUT CONTRAST TECHNIQUE: Multidetector CT imaging of the chest, abdomen and pelvis was performed following the standard protocol without IV contrast. Sagittal and coronal MPR images reconstructed from axial data set. No oral contrast administered. COMPARISON:  CT abdomen and pelvis 12/30/2008 FINDINGS: CT CHEST FINDINGS Cardiovascular: Atherosclerotic calcifications aorta, coronary arteries and proximal great vessels. Aorta normal caliber. No pericardial effusion or definite cardiac abnormality Mediastinum/Nodes: Visualized base of cervical region normal appearance. No thoracic adenopathy. Esophagus unremarkable Lungs/Pleura: Minimal atelectasis versus scarring at base of RIGHT middle lobe medially. No acute infiltrate, pleural effusion or pneumothorax. Tiny noncalcified nodules throughout both lungs 3 mm diameter or less, nonspecific. No dominant mass or nodule identified. Musculoskeletal: Osseous demineralization. CT ABDOMEN PELVIS FINDINGS Hepatobiliary: Question gallstones within gallbladder. Liver unremarkable. CBD minimally  prominent 9 mm diameter. Pancreas: Normal appearance Spleen: Normal appearance. Probable small splenule adjacent to spleen. Adrenals/Urinary Tract: Interval RIGHT adrenalectomy and resection of large myelolipoma. Dense calcification within LEFT adrenal gland representing either prior hemorrhage or infection. Cyst anterior aspect upper pole LEFT kidney 2.3 x 2.1 cm increased since previous exam. Kidneys, ureters, and bladder otherwise normal appearance. Stomach/Bowel: Normal  appendix. Sigmoid diverticulosis without evidence of diverticulitis. Stomach and bowel loops otherwise normal appearance. Vascular/Lymphatic: Extensive atherosclerotic calcification aorta and branch vessels. Upper normal caliber mid abdominal aorta 2.8 x 2.6 cm image 66. Reproductive: Uterus surgically absent. Question atrophic RIGHT ovary with nonvisualization of LEFT ovary. Other: No free air or free fluid. No inflammatory process or hernia. Musculoskeletal: Scattered degenerative changes of the thoracolumbar spine with disc space obliteration at L4-L5 with grade 2 spondylolisthesis and question fusion L4-L5. IMPRESSION: Tiny BILATERAL pulmonary nodules 3 mm diameter or less, nonspecific and of uncertain etiology, recommendation below. No follow-up needed if patient is low-risk (and has no known or suspected primary neoplasm). Non-contrast chest CT can be considered in 12 months if patient is high-risk. This recommendation follows the consensus statement: Guidelines for Management of Incidental Pulmonary Nodules Detected on CT Images: From the Fleischner Society 2017; Radiology 2017; 284:228-243. Extensive atherosclerotic disease including aortic atherosclerosis and coronary arterial calcification. Question cholelithiasis. Sigmoid diverticulosis without evidence of diverticulitis. Interval RIGHT adrenalectomy with evidence of prior LEFT adrenal hemorrhage or infection. Electronically Signed   By: Lavonia Dana M.D.   On: 02/13/2017 17:28      IMPRESSION:   *  Symptomatic anemia. SP PRBC x 2.  FOBT negative. History adenomatous colon polyp 2011. 2 years overdue for repeat screening colonoscopy.  Rule out recurrent polyp, rule out neoplasia (though her history is not worrisome for symptoms of cancer) Taking low-dose aspirin as well as diclofenac, rule out gastritis, ulcer.    PLAN:     *  Will discuss with Dr. Ardis Hughs but suspect she will need colonoscopy plus minus upper endoscopy.  She can have clear liquids today. She doesn't need either the Protonix to switch her over to oral Protonix which is empiric at this point as she is FOBT negative and has no upper GI, nor lower GI symptoms   Azucena Freed  02/14/2017, 9:32 AM Pager: [phone]  ________________________________________________________________________  Velora Heckler GI MD note:  I personally examined the patient, reviewed the data and agree with the assessment and plan described above. New significant (normocytic) anemia without any signs, symptoms of over bleeding (GI or otherwise).  I am planning on colonoscopy +/- EGD tomorrow for further evaluation, if these are negative will need heme consult I suspect.  Currently scheduled for 9:30 start.   Owens Loffler, MD [facility] Pager [phone]

## 2017-02-15 ENCOUNTER — Observation Stay (HOSPITAL_COMMUNITY): Payer: Medicare Other | Admitting: Certified Registered Nurse Anesthetist

## 2017-02-15 ENCOUNTER — Encounter (HOSPITAL_COMMUNITY)
Admission: EM | Disposition: A | Discharge status: Home / Self Care | Payer: Self-pay | Source: Home / Self Care | Attending: Emergency Medicine

## 2017-02-15 ENCOUNTER — Encounter (HOSPITAL_COMMUNITY): Payer: Self-pay | Admitting: *Deleted

## 2017-02-15 DIAGNOSIS — I10 Essential (primary) hypertension: Secondary | ICD-10-CM | POA: Diagnosis not present

## 2017-02-15 DIAGNOSIS — D649 Anemia, unspecified: Secondary | ICD-10-CM | POA: Diagnosis not present

## 2017-02-15 DIAGNOSIS — K633 Ulcer of intestine: Secondary | ICD-10-CM | POA: Diagnosis not present

## 2017-02-15 DIAGNOSIS — N179 Acute kidney failure, unspecified: Secondary | ICD-10-CM | POA: Diagnosis not present

## 2017-02-15 DIAGNOSIS — D62 Acute posthemorrhagic anemia: Secondary | ICD-10-CM | POA: Diagnosis not present

## 2017-02-15 HISTORY — PX: COLONOSCOPY: SHX5424

## 2017-02-15 LAB — CBC
HEMATOCRIT: 31.9 % — AB (ref 36.0–46.0)
Hemoglobin: 10.1 g/dL — ABNORMAL LOW (ref 12.0–15.0)
MCH: 25.1 pg — ABNORMAL LOW (ref 26.0–34.0)
MCHC: 31.7 g/dL (ref 30.0–36.0)
MCV: 79.4 fL (ref 78.0–100.0)
Platelets: 381 10*3/uL (ref 150–400)
RBC: 4.02 MIL/uL (ref 3.87–5.11)
RDW: 17.4 % — AB (ref 11.5–15.5)
WBC: 9.8 10*3/uL (ref 4.0–10.5)

## 2017-02-15 LAB — BASIC METABOLIC PANEL
ANION GAP: 11 (ref 5–15)
BUN: 42 mg/dL — AB (ref 6–20)
CALCIUM: 9 mg/dL (ref 8.9–10.3)
CO2: 23 mmol/L (ref 22–32)
Chloride: 101 mmol/L (ref 101–111)
Creatinine, Ser: 1.49 mg/dL — ABNORMAL HIGH (ref 0.44–1.00)
GFR, EST AFRICAN AMERICAN: 37 mL/min — AB (ref >60)
GFR, EST NON AFRICAN AMERICAN: 32 mL/min — AB (ref >60)
GLUCOSE: 89 mg/dL (ref 65–99)
POTASSIUM: 4.2 mmol/L (ref 3.5–5.1)
Sodium: 135 mmol/L (ref 135–145)

## 2017-02-15 SURGERY — COLONOSCOPY
Anesthesia: Monitor Anesthesia Care

## 2017-02-15 MED ORDER — LACTATED RINGERS IV SOLN
INTRAVENOUS | Status: DC
  Administered 2017-02-15: 09:00:00 via INTRAVENOUS

## 2017-02-15 MED ORDER — PANTOPRAZOLE SODIUM 40 MG PO TBEC: 40.0000 mg | DELAYED_RELEASE_TABLET | Freq: Every day | ORAL | 0 refills | Status: DC

## 2017-02-15 MED ORDER — EPHEDRINE SULFATE-NACL 50-0.9 MG/10ML-% IV SOSY
PREFILLED_SYRINGE | INTRAVENOUS | Status: DC | PRN
  Administered 2017-02-15: 10 mg via INTRAVENOUS

## 2017-02-15 MED ORDER — PROPOFOL 10 MG/ML IV BOLUS
INTRAVENOUS | Status: DC | PRN
  Administered 2017-02-15: 30 mg via INTRAVENOUS

## 2017-02-15 MED ORDER — LIDOCAINE 2% (20 MG/ML) 5 ML SYRINGE
INTRAMUSCULAR | Status: DC | PRN
  Administered 2017-02-15: 60 mg via INTRAVENOUS

## 2017-02-15 MED ORDER — PHENYLEPHRINE 40 MCG/ML (10ML) SYRINGE FOR IV PUSH (FOR BLOOD PRESSURE SUPPORT)
PREFILLED_SYRINGE | INTRAVENOUS | Status: DC | PRN
  Administered 2017-02-15 (×2): 80 ug via INTRAVENOUS

## 2017-02-15 MED ORDER — PROPOFOL 500 MG/50ML IV EMUL
INTRAVENOUS | Status: DC | PRN
  Administered 2017-02-15: 50 ug/kg/min via INTRAVENOUS

## 2017-02-15 NOTE — Op Note (Signed)
[facility] Patient Name: Amy [name] Procedure Date : 02/15/2017 MRN: [id number] Attending MD: Milus Banister , MD Date of Birth: 04-22-1936 CSN: [id number] Age: 81 Admit Type: Inpatient Procedure:                Colonoscopy Indications:              Iron deficiency anemia Providers:                Milus Banister, MD, Carolynn Comment, RN, Cherylynn Ridges, Technician Referring MD:              Medicines:                Monitored Anesthesia Care Complications:            No immediate complications. Estimated blood loss:                            None. Estimated Blood Loss:     Estimated blood loss: none. Procedure:                Pre-Anesthesia Assessment:                           - Prior to the procedure, a History and Physical                            was performed, and patient medications and                            allergies were reviewed. The patient's tolerance of                            previous anesthesia was also reviewed. The risks                            and benefits of the procedure and the sedation                            options and risks were discussed with the patient.                            All questions were answered, and informed consent                            was obtained. Prior Anticoagulants: The patient has                            taken aspirin, last dose was 3 days prior to                            procedure. ASA Grade Assessment: III - A patient  with severe systemic disease. After reviewing the                            risks and benefits, the patient was deemed in                            satisfactory condition to undergo the procedure.                           After obtaining informed consent, the colonoscope                            was passed under direct vision. Throughout the                            procedure, the patient's blood pressure, pulse,  and                            oxygen saturations were monitored continuously. The                            EC-[id number] ([id number]) scope was introduced through                            the anus and advanced to the the cecum, identified                            by appendiceal orifice and ileocecal valve. The                            colonoscopy was performed without difficulty. The                            patient tolerated the procedure well. The quality                            of the bowel preparation was excellent. The                            ileocecal valve, appendiceal orifice, and rectum                            were photographed. Scope In: 10:09:06 AM Scope Out: 10:25:05 AM Scope Withdrawal Time: 0 hours 10 minutes 44 seconds  Total Procedure Duration: 0 hours 15 minutes 59 seconds  Findings:      There were multiple linear ulcers in the right colon, these were       circumferential and mainly along the tops of folds however they were       more confluent in the cecum, along the region of the IC valve. I could       not intube the TI due to the ulceration. The mucosa between the ulcers       was normal and the ulcer in the remaining colon (left and transverse was  all normal appearing). I took biopsies from the ulcerated areas (jar 1)       and the normal/left colon (jar 2).      The exam was otherwise without abnormality on direct and retroflexion       views. Impression:               - Multiple ulcers in the right colon; Ddx includes                            ischemia (related to HTN meds that have been                            recently added/adjusted?), IBD, neoplasm.                           - The examination was otherwise normal on direct                            and retroflexion views. Moderate Sedation:      none Recommendation:           - Return patient to hospital ward for possible                            discharge same day since shes  really not had any                            overt bleeding and her Hb has bumped very well                            (from 6.4 to 9s).                           - Resume regular diet.                           - Continue present medications.                           - Await pathology results. Procedure Code(s):        --- Professional ---                           [phone], Colonoscopy, flexible; with biopsy, single                            or multiple Diagnosis Code(s):        --- Professional ---                           K63.3, Ulcer of intestine                           D50.9, Iron deficiency anemia, unspecified CPT copyright 2016 American Medical Association. All rights reserved. The codes documented in this report are preliminary and upon coder review may  be revised to meet current compliance requirements. Milus Banister,  MD 02/15/2017 10:37:07 AM This report has been signed electronically. Number of Addenda: 0

## 2017-02-15 NOTE — Progress Notes (Signed)
Emmagene L Slayden to be D/C'd Home per MD order.  Discussed prescriptions and follow up appointments with the patient. Prescriptions given to patient, medication list explained in detail. Pt verbalized understanding.  Allergies as of 02/15/2017      Reactions   Codeine Nausea And Vomiting   REACTION: nausea/vomiting   Sulfonamide Derivatives Itching, Rash      Medication List    TAKE these medications   amLODipine 10 MG tablet Commonly known as:  NORVASC Take 10 mg by mouth daily.   aspirin 81 MG tablet Take 81 mg by mouth daily.   atenolol 50 MG tablet Commonly known as:  TENORMIN Take 50 mg by mouth daily.   CALCIUM-VITAMIN D PO Take 1 tablet by mouth 2 (two) times daily.   cholecalciferol 400 units Tabs tablet Commonly known as:  VITAMIN D Take 400 Units by mouth daily.   Diclofenac Sodium CR 100 MG 24 hr tablet Take 100 mg by mouth daily.   fluticasone 50 MCG/ACT nasal spray Commonly known as:  FLONASE Place 2 sprays into both nostrils at bedtime.   HYDROcodone-acetaminophen 5-325 MG tablet Commonly known as:  NORCO/VICODIN Take 1 tablet by mouth every 6 (six) hours as needed for moderate pain.   loratadine 10 MG tablet Commonly known as:  CLARITIN Take 10 mg by mouth daily.   meclizine 25 MG tablet Commonly known as:  ANTIVERT Take 25 mg by mouth 3 (three) times daily as needed for dizziness.   multivitamin with minerals Tabs tablet Take 1 tablet by mouth daily.   nitroGLYCERIN 0.4 MG SL tablet Commonly known as:  NITROSTAT Place 0.4 mg under the tongue every 5 (five) minutes as needed for chest pain.   pantoprazole 40 MG tablet Commonly known as:  PROTONIX Take 1 tablet (40 mg total) by mouth daily at 6 (six) AM. Start taking on:  02/16/2017   telmisartan-hydrochlorothiazide 80-25 MG tablet Commonly known as:  MICARDIS HCT Take 1 tablet by mouth daily.   vitamin B-12 1000 MCG tablet Commonly known as:  CYANOCOBALAMIN Take 1,000 mcg by mouth daily.   vitamin E 400 UNIT capsule Take 400 Units by mouth daily.       Vitals:   02/15/17 1040 02/15/17 1102  BP: (!) 137/52 (!) 141/39  Pulse: 80 82  Resp: 18 17  Temp:  98.4 F (36.9 C)    Skin clean, dry and intact without evidence of skin break down, no evidence of skin tears noted. IV catheter discontinued intact. Site without signs and symptoms of complications. Dressing and pressure applied. Pt denies pain at this time. No complaints noted.  An After Visit Summary was printed and given to the patient. Patient escorted via [place], and D/C home via private auto.  Haywood Lasso BSN, RN [facility] Phone [phone]

## 2017-02-15 NOTE — H&P (View-Only) (Signed)
[facility] Consult: 9:32 AM 02/14/2017  LOS: 0 days    Referring Provider: Dr Doyle Askew Primary Care Physician:  Geoffery Lyons, MD Primary Gastroenterologist:  Dr. Henrene Pastor     Reason for Consultation:  Anemia.     HPI: Amy [name] is a 81 y.o. female.  PMH left subclavian stenosis, carotid artery dz.  s/p left CEA.  HTN.  HLD.  Degenerative spinal and disc disease. Status post 3 separate surgeries on her low back.  s/p right adrenal gland resection.  12/2009 Colonoscopy, screening.  Pedunculated polyp in the sigmoid colon - removed (tubular adenoma).  Moderate diverticulosis in the left colon Recall letters sent 12/2014  5 days of profound fatigue/weakness. Hardly able to get up and move around her house without getting dizzy and very weak. She attributed symptoms to blood pressure medications. For several months she has had medications added and adjusted for management of hypertension.  Seen at [place] office yesterday. She was anemic with hemoglobin of 6.4. She was sent to the emergency department for evaluation and admission.  Patient has had many years of stable bowel movements which are brown, formed somewhat difficult to pass but she has never seen even scant amounts of blood. She has never had melena. Her appetite is good. Weight is stable. No reflux symptoms noted. No dysphagia.  No abdominal pain, bloating or distress of any sort. No previous issues with anemia or need for supplemental iron.  She does take vitamin B-12 daily at home.  Patient takes a low-dose aspirin and diclofenac daily.  Repeat labs in ED with Hgb 6.4 >>  2 units PRBC>> 9.3.  MCV 81.  Previous Hgb 11.5 in 09/2010.  Iron 11, Ferritin 31.  Coags normal.  Stool FOBT.  TSH normal.    CT chest/ab/pelvis: Shows tiny pulmonary nodules, nonspecific.  Extensive atherosclerotic disease involving the aorta and coronary arteries. Question cholelithiasis. Mildly prominent, 9 mm diameter CBD.  Probable small  splenule.  Sigmoid diverticulosis.     Past Medical History:  Diagnosis Date  . Arthritis   . HTN (hypertension)   . Hypercholesterolemia     Past Surgical History:  Procedure Laterality Date  . BACK SURGERY    . CARDIAC CATHETERIZATION  01-02-2009   The anterior descending artery is moderate  in size. There is an eccentric 60-70% proximal stenosis followed by 50-60% mid stenosis. EF 75-80%  . CARDIOVASCULAR STRESS TEST  12-26-2008   EF 65%  . CATARACT EXTRACTION    . CYSTECTOMY     Right Hand  . HEMORROIDECTOMY    . TONSILLECTOMY    . TOTAL ABDOMINAL HYSTERECTOMY      Prior to Admission medications   Medication Sig Start Date End Date Taking? Authorizing Provider  amLODipine (NORVASC) 10 MG tablet Take 10 mg by mouth daily.   Yes Historical Provider, MD  aspirin 81 MG tablet Take 81 mg by mouth daily.   Yes Historical Provider, MD  atenolol (TENORMIN) 50 MG tablet Take 50 mg by mouth daily.   Yes Historical Provider, MD  CALCIUM-VITAMIN D  PO Take 1 tablet by mouth 2 (two) times daily.   Yes Historical Provider, MD  cholecalciferol (VITAMIN D) 400 units TABS tablet Take 400 Units by mouth daily.   Yes Historical Provider, MD  Diclofenac Sodium CR 100 MG 24 hr tablet Take 100 mg by mouth daily.   Yes Historical Provider, MD  fluticasone (FLONASE) 50 MCG/ACT nasal spray Place 2 sprays into both nostrils at bedtime.   Yes Historical Provider, MD  HYDROcodone-acetaminophen (NORCO/VICODIN) 5-325 MG tablet Take 1 tablet by mouth every 6 (six) hours as needed for moderate pain.   Yes Historical Provider, MD  loratadine (CLARITIN) 10 MG tablet Take 10 mg by mouth daily.   Yes Historical Provider, MD  meclizine (ANTIVERT) 25 MG tablet Take 25 mg by mouth 3 (three) times daily as needed for dizziness.   Yes Historical Provider, MD    Multiple Vitamin (MULTIVITAMIN WITH MINERALS) TABS tablet Take 1 tablet by mouth daily.   Yes Historical Provider, MD  nitroGLYCERIN (NITROSTAT) 0.4 MG SL tablet Place 0.4 mg under the tongue every 5 (five) minutes as needed for chest pain.    Yes Historical Provider, MD  telmisartan-hydrochlorothiazide (MICARDIS HCT) 80-25 MG tablet Take 1 tablet by mouth daily.   Yes Historical Provider, MD  vitamin B-12 (CYANOCOBALAMIN) 1000 MCG tablet Take 1,000 mcg by mouth daily.    Yes Historical Provider, MD  vitamin E 400 UNIT capsule Take 400 Units by mouth daily.   Yes Historical Provider, MD    Scheduled Meds: . amLODipine  10 mg Oral Daily  . atenolol  50 mg Oral Daily  . loratadine  10 mg Oral Daily  . pantoprazole (PROTONIX) IV  40 mg Intravenous Q12H  . senna  1 tablet Oral BID  . sodium chloride flush  3 mL Intravenous Q12H   Infusions:  PRN Meds: acetaminophen **OR** acetaminophen, bisacodyl, HYDROcodone-acetaminophen, ondansetron **OR** ondansetron (ZOFRAN) IV, polyethylene glycol   Allergies as of 02/13/2017 - Review Complete 02/13/2017  Allergen Reaction Noted  . Codeine Nausea And Vomiting 12/02/2009  . Sulfonamide derivatives Itching and Rash 12/02/2009    Family History  Problem Relation Age of Onset  . Diabetes Father   . Diabetes Other   . CAD Neg Hx   . Stroke Neg Hx   . Cancer Neg Hx     Social History   Social History  . Marital status: Widowed    Spouse name: N/A  . Number of children: N/A  . Years of education: N/A   Occupational History  . Not on file.   Social History Main Topics  . Smoking status: Former Research scientist (life sciences)  . Smokeless tobacco: Never Used     Comment: Quite 2009  . Alcohol use No  . Drug use: No  . Sexual activity: Not on file   Other Topics Concern  . Not on file   Social History Narrative  . No narrative on file    REVIEW OF SYSTEMS: Constitutional:  Per HPI ENT:  No nose bleeds Pulm:  Some DOE.  No cough CV:  No  palpitations, no LE edema.  GU:  No hematuria, no frequency GI:  Per HPI Heme:  No excessive bruising or bleeding.   Transfusions:  None ever before last night Neuro:  No headaches, no peripheral tingling or numbness Derm:  No itching, no rash or sores.  Endocrine:  No sweats or chills.  No polyuria or dysuria Immunization:  Up to date with flu and pnemovax.  Travel:  None  beyond local counties in last few months.    PHYSICAL EXAM: Vital signs in last 24 hours: Vitals:   02/14/17 0430 02/14/17 0902  BP: (!) 133/40 (!) 157/47  Pulse: 76 77  Resp: 16 17  Temp: 98.6 F (37 C) 98.3 F (36.8 C)   Wt Readings from Last 3 Encounters:  02/13/17 67.1 kg (148 lb)  11/10/16 67.1 kg (148 lb)    General: Pleasant, comfortable, calm elderly WF Head:  No asymmetry, no signs of head trauma. No facial edema.  Eyes:  No scleral icterus. No conjunctival pallor. EOMI. Ears:  No hearing deficit  Nose:  No congestion or discharge Mouth:  Tongue midline. Oral mucosa moist and clear. Good dental condition. Neck:  No JVD, no masses, no thyromegaly. Lungs:  Clear bilaterally. No cough. No dyspnea. Heart: RRR. No MRG. S1, S2 present. Abdomen:  Soft. Not tender. No masses. No HSM. No bruits or hernias. No distention..   Rectal: Did not repeat rectal exam performed yesterday when the stool was FOBT negative   Musc/Skeltl: No joint redness, swelling or gross deformity. Extremities:  No CCE.  Neurologic:  Alert. Oriented times 3. Good historian. No limb weakness. No tremor. Skin:  No rashes, sores, telangiectasia. Tattoos:  None Nodes:  No cervical adenopathy.   Psych:  Calm, cooperative, pleasant.  Intake/Output from previous day: 04/09 0701 - 04/10 0700 In: 1648.8 [P.O.:120; I.V.:453.8; Blood:1075] Out: 1325 [[id number]] Intake/Output this shift: No intake/output data recorded.  LAB RESULTS:  Recent Labs  02/13/17 1455 02/14/17 0257  WBC 12.8* 9.9  HGB 6.4* 9.3*  HCT 21.3* 29.2*   PLT 459* 325   BMET Lab Results  Component Value Date   NA 134 (L) 02/14/2017   NA 134 (L) 02/13/2017   NA 139 09/29/2010   K 4.2 02/14/2017   K 4.2 02/13/2017   K 3.8 09/29/2010   CL 101 02/14/2017   CL 96 (L) 02/13/2017   CL 104 09/29/2010   CO2 22 02/14/2017   CO2 24 02/13/2017   CO2 30 09/29/2010   GLUCOSE 98 02/14/2017   GLUCOSE 135 (H) 02/13/2017   GLUCOSE 144 (H) 09/29/2010   BUN 57 (H) 02/14/2017   BUN 64 (H) 02/13/2017   BUN 10 09/29/2010   CREATININE 1.66 (H) 02/14/2017   CREATININE 1.90 (H) 02/13/2017   CREATININE 0.81 09/29/2010   CALCIUM 8.8 (L) 02/14/2017   CALCIUM 9.4 02/13/2017   CALCIUM 8.7 09/29/2010   LFT  Recent Labs  02/13/17 2135 02/14/17 0257  PROT 6.9 6.4*  ALBUMIN 2.6* 2.5*  AST 13* 11*  ALT 12* 10*  ALKPHOS 110 97  BILITOT 0.4 0.9  BILIDIR 0.1  --   IBILI 0.3  --    PT/INR Lab Results  Component Value Date   INR 1.11 02/14/2017   INR 0.89 09/24/2010   INR 0.9 02/05/2009   Hepatitis Panel No results for input(s): HEPBSAG, HCVAB, HEPAIGM, HEPBIGM in the last 72 hours. C-Diff No components found for: CDIFF Lipase  No results found for: LIPASE  Drugs of Abuse  No results found for: LABOPIA, COCAINSCRNUR, LABBENZ, AMPHETMU, THCU, LABBARB   RADIOLOGY STUDIES: Ct Abdomen Pelvis Wo Contrast  Result Date: 02/13/2017 CLINICAL DATA:  Anemia, generalized weakness, back pain, history hypertension, just began new blood pressure medicine, hypercholesterolemia, former smoker, assessment for malignancy EXAM: CT CHEST, ABDOMEN AND PELVIS WITHOUT CONTRAST TECHNIQUE: Multidetector CT imaging of the chest, abdomen and pelvis was performed following the standard protocol without IV contrast. Sagittal and coronal  MPR images reconstructed from axial data set. No oral contrast administered. COMPARISON:  CT abdomen and pelvis 12/30/2008 FINDINGS: CT CHEST FINDINGS Cardiovascular: Atherosclerotic calcifications aorta, coronary arteries and proximal  great vessels. Aorta normal caliber. No pericardial effusion or definite cardiac abnormality Mediastinum/Nodes: Visualized base of cervical region normal appearance. No thoracic adenopathy. Esophagus unremarkable Lungs/Pleura: Minimal atelectasis versus scarring at base of RIGHT middle lobe medially. No acute infiltrate, pleural effusion or pneumothorax. Tiny noncalcified nodules throughout both lungs 3 mm diameter or less, nonspecific. No dominant mass or nodule identified. Musculoskeletal: Osseous demineralization. CT ABDOMEN PELVIS FINDINGS Hepatobiliary: Question gallstones within gallbladder. Liver unremarkable. CBD minimally prominent 9 mm diameter. Pancreas: Normal appearance Spleen: Normal appearance. Probable small splenule adjacent to spleen. Adrenals/Urinary Tract: Interval RIGHT adrenalectomy and resection of large myelolipoma. Dense calcification within LEFT adrenal gland representing either prior hemorrhage or infection. Cyst anterior aspect upper pole LEFT kidney 2.3 x 2.1 cm increased since previous exam. Kidneys, ureters, and bladder otherwise normal appearance. Stomach/Bowel: Normal appendix. Sigmoid diverticulosis without evidence of diverticulitis. Stomach and bowel loops otherwise normal appearance. Vascular/Lymphatic: Extensive atherosclerotic calcification aorta and branch vessels. Upper normal caliber mid abdominal aorta 2.8 x 2.6 cm image 66. Reproductive: Uterus surgically absent. Question atrophic RIGHT ovary with nonvisualization of LEFT ovary. Other: No free air or free fluid. No inflammatory process or hernia. Musculoskeletal: Scattered degenerative changes of the thoracolumbar spine with disc space obliteration at L4-L5 with grade 2 spondylolisthesis and question fusion L4-L5. IMPRESSION: Tiny BILATERAL pulmonary nodules 3 mm diameter or less, nonspecific and of uncertain etiology, recommendation below. No follow-up needed if patient is low-risk (and has no known or suspected primary  neoplasm). Non-contrast chest CT can be considered in 12 months if patient is high-risk. This recommendation follows the consensus statement: Guidelines for Management of Incidental Pulmonary Nodules Detected on CT Images: From the Fleischner Society 2017; Radiology 2017; 284:228-243. Extensive atherosclerotic disease including aortic atherosclerosis and coronary arterial calcification. Question cholelithiasis. Sigmoid diverticulosis without evidence of diverticulitis. Interval RIGHT adrenalectomy with evidence of prior LEFT adrenal hemorrhage or infection. Electronically Signed   By: Lavonia Dana M.D.   On: 02/13/2017 17:28   Ct Chest Wo Contrast  Result Date: 02/13/2017 CLINICAL DATA:  Anemia, generalized weakness, back pain, history hypertension, just began new blood pressure medicine, hypercholesterolemia, former smoker, assessment for malignancy EXAM: CT CHEST, ABDOMEN AND PELVIS WITHOUT CONTRAST TECHNIQUE: Multidetector CT imaging of the chest, abdomen and pelvis was performed following the standard protocol without IV contrast. Sagittal and coronal MPR images reconstructed from axial data set. No oral contrast administered. COMPARISON:  CT abdomen and pelvis 12/30/2008 FINDINGS: CT CHEST FINDINGS Cardiovascular: Atherosclerotic calcifications aorta, coronary arteries and proximal great vessels. Aorta normal caliber. No pericardial effusion or definite cardiac abnormality Mediastinum/Nodes: Visualized base of cervical region normal appearance. No thoracic adenopathy. Esophagus unremarkable Lungs/Pleura: Minimal atelectasis versus scarring at base of RIGHT middle lobe medially. No acute infiltrate, pleural effusion or pneumothorax. Tiny noncalcified nodules throughout both lungs 3 mm diameter or less, nonspecific. No dominant mass or nodule identified. Musculoskeletal: Osseous demineralization. CT ABDOMEN PELVIS FINDINGS Hepatobiliary: Question gallstones within gallbladder. Liver unremarkable. CBD minimally  prominent 9 mm diameter. Pancreas: Normal appearance Spleen: Normal appearance. Probable small splenule adjacent to spleen. Adrenals/Urinary Tract: Interval RIGHT adrenalectomy and resection of large myelolipoma. Dense calcification within LEFT adrenal gland representing either prior hemorrhage or infection. Cyst anterior aspect upper pole LEFT kidney 2.3 x 2.1 cm increased since previous exam. Kidneys, ureters, and bladder otherwise normal appearance. Stomach/Bowel: Normal  appendix. Sigmoid diverticulosis without evidence of diverticulitis. Stomach and bowel loops otherwise normal appearance. Vascular/Lymphatic: Extensive atherosclerotic calcification aorta and branch vessels. Upper normal caliber mid abdominal aorta 2.8 x 2.6 cm image 66. Reproductive: Uterus surgically absent. Question atrophic RIGHT ovary with nonvisualization of LEFT ovary. Other: No free air or free fluid. No inflammatory process or hernia. Musculoskeletal: Scattered degenerative changes of the thoracolumbar spine with disc space obliteration at L4-L5 with grade 2 spondylolisthesis and question fusion L4-L5. IMPRESSION: Tiny BILATERAL pulmonary nodules 3 mm diameter or less, nonspecific and of uncertain etiology, recommendation below. No follow-up needed if patient is low-risk (and has no known or suspected primary neoplasm). Non-contrast chest CT can be considered in 12 months if patient is high-risk. This recommendation follows the consensus statement: Guidelines for Management of Incidental Pulmonary Nodules Detected on CT Images: From the Fleischner Society 2017; Radiology 2017; 284:228-243. Extensive atherosclerotic disease including aortic atherosclerosis and coronary arterial calcification. Question cholelithiasis. Sigmoid diverticulosis without evidence of diverticulitis. Interval RIGHT adrenalectomy with evidence of prior LEFT adrenal hemorrhage or infection. Electronically Signed   By: Lavonia Dana M.D.   On: 02/13/2017 17:28      IMPRESSION:   *  Symptomatic anemia. SP PRBC x 2.  FOBT negative. History adenomatous colon polyp 2011. 2 years overdue for repeat screening colonoscopy.  Rule out recurrent polyp, rule out neoplasia (though her history is not worrisome for symptoms of cancer) Taking low-dose aspirin as well as diclofenac, rule out gastritis, ulcer.    PLAN:     *  Will discuss with Dr. Ardis Hughs but suspect she will need colonoscopy plus minus upper endoscopy.  She can have clear liquids today. She doesn't need either the Protonix to switch her over to oral Protonix which is empiric at this point as she is FOBT negative and has no upper GI, nor lower GI symptoms   Amy [name]  02/14/2017, 9:32 AM Pager: [phone]  ________________________________________________________________________  Velora Heckler GI MD note:  I personally examined the patient, reviewed the data and agree with the assessment and plan described above. New significant (normocytic) anemia without any signs, symptoms of over bleeding (GI or otherwise).  I am planning on colonoscopy +/- EGD tomorrow for further evaluation, if these are negative will need heme consult I suspect.  Currently scheduled for 9:30 start.   Owens Loffler, MD [facility] Pager [phone]

## 2017-02-15 NOTE — Progress Notes (Signed)
Pt has completed her Movie prep and her stool is running clear.

## 2017-02-15 NOTE — Discharge Instructions (Signed)
Blood Transfusion , Adult A blood transfusion is a procedure in which you receive donated blood, including plasma, platelets, and red blood cells, through an IV tube. You may need a blood transfusion because of illness, surgery, or injury. The blood may come from a donor. You may also be able to donate blood for yourself (autologous blood donation) before a surgery if you know that you might require a blood transfusion. The blood given in a transfusion is made up of different types of cells. You may receive:  Red blood cells. These carry oxygen to the cells in the body.  White blood cells. These help you fight infections.  Platelets. These help your blood to clot.  Plasma. This is the liquid part of your blood and it helps with fluid imbalances. If you have hemophilia or another clotting disorder, you may also receive other types of blood products. Tell a health care provider about:  Any allergies you have.  All medicines you are taking, including vitamins, herbs, eye drops, creams, and over-the-counter medicines.  Any problems you or family members have had with anesthetic medicines.  Any blood disorders you have.  Any surgeries you have had.  Any medical conditions you have, including any recent fever or cold symptoms.  Whether you are pregnant or may be pregnant.  Any previous reactions you have had during a blood transfusion. What are the risks? Generally, this is a safe procedure. However, problems may occur, including:  Having an allergic reaction to something in the donated blood. Hives and itching may be symptoms of this type of reaction.  Fever. This may be a reaction to the white blood cells in the transfused blood. Nausea or chest pain may accompany a fever.  Iron overload. This can happen from having many transfusions.  Transfusion-related acute lung injury (TRALI). This is a rare reaction that causes lung damage. The cause is not known.TRALI can occur within hours  of a transfusion or several days later.  Sudden (acute) or delayed hemolytic reactions. This happens if your blood does not match the cells in your transfusion. Your body's defense system (immune system) may try to attack the new cells. This complication is rare. The symptoms include fever, chills, nausea, and low back pain or chest pain.  Infection or disease transmission. This is rare. What happens before the procedure?  You will have a blood test to determine your blood type. This is necessary to know what kind of blood your body will accept and to match it to the donor blood.  If you are going to have a planned surgery, you may be able to do an autologous blood donation. This may be done in case you need to have a transfusion.  If you have had an allergic reaction to a transfusion in the past, you may be given medicine to help prevent a reaction. This medicine may be given to you by mouth or through an IV tube.  You will have your temperature, blood pressure, and pulse monitored before the transfusion.  Follow instructions from your health care provider about eating and drinking restrictions.  Ask your health care provider about:  Changing or stopping your regular medicines. This is especially important if you are taking diabetes medicines or blood thinners.  Taking medicines such as aspirin and ibuprofen. These medicines can thin your blood. Do not take these medicines before your procedure if your health care provider instructs you not to. What happens during the procedure?  An IV tube will be   inserted into one of your veins.  The bag of donated blood will be attached to your IV tube. The blood will then enter through your vein.  Your temperature, blood pressure, and pulse will be monitored regularly during the transfusion. This monitoring is done to detect early signs of a transfusion reaction.  If you have any signs or symptoms of a reaction, your transfusion will be stopped and  you may be given medicine.  When the transfusion is complete, your IV tube will be removed.  Pressure may be applied to the IV site for a few minutes.  A bandage (dressing) will be applied. The procedure may vary among health care providers and hospitals. What happens after the procedure?  Your temperature, blood pressure, heart rate, breathing rate, and blood oxygen level will be monitored often.  Your blood may be tested to see how you are responding to the transfusion.  You may be warmed with fluids or blankets to maintain a normal body temperature. Summary  A blood transfusion is a procedure in which you receive donated blood, including plasma, platelets, and red blood cells, through an IV tube.  Your temperature, blood pressure, and pulse will be monitored before, during, and after the transfusion.  Your blood may be tested after the transfusion to see how your body has responded. This information is not intended to replace advice given to you by your health care provider. Make sure you discuss any questions you have with your health care provider. Document Released: 10/21/2000 Document Revised: 07/21/2016 Document Reviewed: 07/21/2016 Elsevier Interactive Patient Education  2017 Elsevier Inc[employer]  

## 2017-02-15 NOTE — Anesthesia Postprocedure Evaluation (Signed)
Anesthesia Post Note  Patient: Amy [name]  Procedure(s) Performed: Procedure(s) (LRB): COLONOSCOPY (N/A)  Patient location during evaluation: PACU Anesthesia Type: MAC Level of consciousness: awake Pain management: pain level controlled Vital Signs Assessment: post-procedure vital signs reviewed and stable Respiratory status: spontaneous breathing Cardiovascular status: stable Anesthetic complications: no       Last Vitals:  Vitals:   02/15/17 1040 02/15/17 1102  BP: (!) 137/52 (!) 141/39  Pulse: 80 82  Resp: 18 17  Temp:  36.9 C    Last Pain:  Vitals:   02/15/17 1102  TempSrc: Oral  PainSc:                  Nivek Powley

## 2017-02-15 NOTE — Anesthesia Preprocedure Evaluation (Addendum)
Anesthesia Evaluation  Patient identified by MRN, date of birth, ID band Patient awake    Reviewed: Allergy & Precautions, NPO status , Patient's Chart, lab work & pertinent test results  Airway Mallampati: II  TM Distance: >3 FB     Dental   Pulmonary former smoker,    breath sounds clear to auscultation       Cardiovascular hypertension,  Rhythm:Regular Rate:Normal     Neuro/Psych    GI/Hepatic Neg liver ROS, History noted. CG   Endo/Other    Renal/GU Renal InsufficiencyRenal disease     Musculoskeletal  (+) Arthritis ,   Abdominal   Peds  Hematology  (+) anemia ,   Anesthesia Other Findings   Reproductive/Obstetrics                            Anesthesia Physical Anesthesia Plan  ASA: III  Anesthesia Plan: MAC   Post-op Pain Management:    Induction: Intravenous  Airway Management Planned: Natural Airway, Nasal Cannula, Mask and Simple Face Mask  Additional Equipment:   Intra-op Plan:   Post-operative Plan:   Informed Consent: I have reviewed the patients History and Physical, chart, labs and discussed the procedure including the risks, benefits and alternatives for the proposed anesthesia with the patient or authorized representative who has indicated his/her understanding and acceptance.   Dental advisory given  Plan Discussed with: CRNA and Anesthesiologist  Anesthesia Plan Comments:        Anesthesia Quick Evaluation

## 2017-02-15 NOTE — Interval H&P Note (Signed)
History and Physical Interval Note:  02/15/2017 9:44 AM  Amy [name]  has presented today for surgery, with the diagnosis of new anemia, history of colon polyps.  The various methods of treatment have been discussed with the patient and family. After consideration of risks, benefits and other options for treatment, the patient has consented to  Procedure(s): COLONOSCOPY (N/A) ESOPHAGOGASTRODUODENOSCOPY (EGD) (N/A) as a surgical intervention .  The patient's history has been reviewed, patient examined, no change in status, stable for surgery.  I have reviewed the patient's chart and labs.  Questions were answered to the patient's satisfaction.     Milus Banister

## 2017-02-15 NOTE — Transfer of Care (Signed)
Immediate Anesthesia Transfer of Care Note  Patient: Amy [name]  Procedure(s) Performed: Procedure(s): COLONOSCOPY (N/A)  Patient Location: Endoscopy Unit  Anesthesia Type:MAC  Level of Consciousness: awake, alert , oriented and patient cooperative  Airway & Oxygen Therapy: Patient Spontanous Breathing and Patient connected to nasal cannula oxygen  Post-op Assessment: Report given to RN, Post -op Vital signs reviewed and stable and Patient moving all extremities X 4  Post vital signs: Reviewed and stable  Last Vitals:  Vitals:   02/15/17 0422 02/15/17 0916  BP: (!) 164/46 (!) 171/46  Pulse: 68   Resp: 19 15  Temp: 36.8 C 36.7 C    Last Pain:  Vitals:   02/15/17 0916  TempSrc: Oral  PainSc: 6       Patients Stated Pain Goal: 6 (29/19/16 6060)  Complications: No apparent anesthesia complications

## 2017-02-15 NOTE — Discharge Summary (Signed)
Physician Discharge Summary  Amy [name]:[id number] DOB: 11-11-1935 DOA: 02/13/2017  PCP: Geoffery Lyons, MD  Admit date: 02/13/2017 Discharge date: 02/15/2017  Recommendations for Outpatient Follow-up:  1. Pt will need to follow up with PCP in 1-2 weeks post discharge 2. Please obtain BMP to evaluate electrolytes and kidney function 3. Please also check CBC to evaluate Hg and Hct levels  Discharge Diagnoses:  Active Problems:   AKI (acute kidney injury) ([place])   Anemia   Abnormal ECG   Essential hypertension   Leukocytosis   Elevated vitamin B12 level   Ulcer, colon  Discharge Condition: Stable  Diet recommendation: Heart healthy diet discussed in details   Brief Narrative:  81 y.o.female with left subclavian stenosis, carotid artery dz. s/p left CEA. HTN. HLD, egenerative spinal and disc disease. Status post 3 separate surgeries on her low back, s/p right adrenal gland resection, 12/2009 Colonoscopy, screening. Pedunculated polyp in the sigmoid colon - removed (tubular adenoma). Moderate diverticulosis in the left colon, presented to [facility] with main concern of progressively worsening fatigue almost one week in duration. This has been associated with dizziness and poor oral intake. She was seen by primary care physician in the office, had blood work done and her hemoglobin was 6.4  Assessment & Plan:  Symptomatic acute blood loss anemia, IDA - SP PRBC x 2.FOBT negative - Hg up from 6.4 --> 9.3 --> 10.1 this AM and pt reports feeling better - GI consulted, colonoscopy done, samples sent for pathology  - GI team cleared for discharge and pt wants to go home   Acute kidney injury - in the setting of acute blood loss anemia - improving from 1.9 --> 1.66 --> 1.49 this AM  HTN - continue Norvasc and Atenolol   Minimal elevation of trop - 0.03, no cheat pain and suspect this to be related to demand ischemia - pt with no CP this AM  Leukocytosis,  thrombocytosis  - possibly reactive - no signs of an infectious etiology - now resolved, WBC and Plt both WNL  DVT prophylaxis: SCD's Code Status: DNR Family Communication: Patient at bedside  Disposition Plan: pt wants to go home   Consultants:   GI  Antimicrobials:   None  Procedures/Studies: Ct Abdomen Pelvis Wo Contrast  Result Date: 02/13/2017 CLINICAL DATA:  Anemia, generalized weakness, back pain, history hypertension, just began new blood pressure medicine, hypercholesterolemia, former smoker, assessment for malignancy EXAM: CT CHEST, ABDOMEN AND PELVIS WITHOUT CONTRAST TECHNIQUE: Multidetector CT imaging of the chest, abdomen and pelvis was performed following the standard protocol without IV contrast. Sagittal and coronal MPR images reconstructed from axial data set. No oral contrast administered. COMPARISON:  CT abdomen and pelvis 12/30/2008 FINDINGS: CT CHEST FINDINGS Cardiovascular: Atherosclerotic calcifications aorta, coronary arteries and proximal great vessels. Aorta normal caliber. No pericardial effusion or definite cardiac abnormality Mediastinum/Nodes: Visualized base of cervical region normal appearance. No thoracic adenopathy. Esophagus unremarkable Lungs/Pleura: Minimal atelectasis versus scarring at base of RIGHT middle lobe medially. No acute infiltrate, pleural effusion or pneumothorax. Tiny noncalcified nodules throughout both lungs 3 mm diameter or less, nonspecific. No dominant mass or nodule identified. Musculoskeletal: Osseous demineralization. CT ABDOMEN PELVIS FINDINGS Hepatobiliary: Question gallstones within gallbladder. Liver unremarkable. CBD minimally prominent 9 mm diameter. Pancreas: Normal appearance Spleen: Normal appearance. Probable small splenule adjacent to spleen. Adrenals/Urinary Tract: Interval RIGHT adrenalectomy and resection of large myelolipoma. Dense calcification within LEFT adrenal gland representing either prior hemorrhage or infection.  Cyst anterior aspect upper pole  LEFT kidney 2.3 x 2.1 cm increased since previous exam. Kidneys, ureters, and bladder otherwise normal appearance. Stomach/Bowel: Normal appendix. Sigmoid diverticulosis without evidence of diverticulitis. Stomach and bowel loops otherwise normal appearance. Vascular/Lymphatic: Extensive atherosclerotic calcification aorta and branch vessels. Upper normal caliber mid abdominal aorta 2.8 x 2.6 cm image 66. Reproductive: Uterus surgically absent. Question atrophic RIGHT ovary with nonvisualization of LEFT ovary. Other: No free air or free fluid. No inflammatory process or hernia. Musculoskeletal: Scattered degenerative changes of the thoracolumbar spine with disc space obliteration at L4-L5 with grade 2 spondylolisthesis and question fusion L4-L5. IMPRESSION: Tiny BILATERAL pulmonary nodules 3 mm diameter or less, nonspecific and of uncertain etiology, recommendation below. No follow-up needed if patient is low-risk (and has no known or suspected primary neoplasm). Non-contrast chest CT can be considered in 12 months if patient is high-risk. This recommendation follows the consensus statement: Guidelines for Management of Incidental Pulmonary Nodules Detected on CT Images: From the Fleischner Society 2017; Radiology 2017; 284:228-243. Extensive atherosclerotic disease including aortic atherosclerosis and coronary arterial calcification. Question cholelithiasis. Sigmoid diverticulosis without evidence of diverticulitis. Interval RIGHT adrenalectomy with evidence of prior LEFT adrenal hemorrhage or infection. Electronically Signed   By: Lavonia Dana M.D.   On: 02/13/2017 17:28   Ct Chest Wo Contrast  Result Date: 02/13/2017 CLINICAL DATA:  Anemia, generalized weakness, back pain, history hypertension, just began new blood pressure medicine, hypercholesterolemia, former smoker, assessment for malignancy EXAM: CT CHEST, ABDOMEN AND PELVIS WITHOUT CONTRAST TECHNIQUE: Multidetector CT  imaging of the chest, abdomen and pelvis was performed following the standard protocol without IV contrast. Sagittal and coronal MPR images reconstructed from axial data set. No oral contrast administered. COMPARISON:  CT abdomen and pelvis 12/30/2008 FINDINGS: CT CHEST FINDINGS Cardiovascular: Atherosclerotic calcifications aorta, coronary arteries and proximal great vessels. Aorta normal caliber. No pericardial effusion or definite cardiac abnormality Mediastinum/Nodes: Visualized base of cervical region normal appearance. No thoracic adenopathy. Esophagus unremarkable Lungs/Pleura: Minimal atelectasis versus scarring at base of RIGHT middle lobe medially. No acute infiltrate, pleural effusion or pneumothorax. Tiny noncalcified nodules throughout both lungs 3 mm diameter or less, nonspecific. No dominant mass or nodule identified. Musculoskeletal: Osseous demineralization. CT ABDOMEN PELVIS FINDINGS Hepatobiliary: Question gallstones within gallbladder. Liver unremarkable. CBD minimally prominent 9 mm diameter. Pancreas: Normal appearance Spleen: Normal appearance. Probable small splenule adjacent to spleen. Adrenals/Urinary Tract: Interval RIGHT adrenalectomy and resection of large myelolipoma. Dense calcification within LEFT adrenal gland representing either prior hemorrhage or infection. Cyst anterior aspect upper pole LEFT kidney 2.3 x 2.1 cm increased since previous exam. Kidneys, ureters, and bladder otherwise normal appearance. Stomach/Bowel: Normal appendix. Sigmoid diverticulosis without evidence of diverticulitis. Stomach and bowel loops otherwise normal appearance. Vascular/Lymphatic: Extensive atherosclerotic calcification aorta and branch vessels. Upper normal caliber mid abdominal aorta 2.8 x 2.6 cm image 66. Reproductive: Uterus surgically absent. Question atrophic RIGHT ovary with nonvisualization of LEFT ovary. Other: No free air or free fluid. No inflammatory process or hernia. Musculoskeletal:  Scattered degenerative changes of the thoracolumbar spine with disc space obliteration at L4-L5 with grade 2 spondylolisthesis and question fusion L4-L5. IMPRESSION: Tiny BILATERAL pulmonary nodules 3 mm diameter or less, nonspecific and of uncertain etiology, recommendation below. No follow-up needed if patient is low-risk (and has no known or suspected primary neoplasm). Non-contrast chest CT can be considered in 12 months if patient is high-risk. This recommendation follows the consensus statement: Guidelines for Management of Incidental Pulmonary Nodules Detected on CT Images: From the Fleischner Society 2017; Radiology 2017; 284:228-243. Extensive  atherosclerotic disease including aortic atherosclerosis and coronary arterial calcification. Question cholelithiasis. Sigmoid diverticulosis without evidence of diverticulitis. Interval RIGHT adrenalectomy with evidence of prior LEFT adrenal hemorrhage or infection. Electronically Signed   By: Lavonia Dana M.D.   On: 02/13/2017 17:28   Discharge Exam: Vitals:   02/15/17 1040 02/15/17 1102  BP: (!) 137/52 (!) 141/39  Pulse: 80 82  Resp: 18 17  Temp:  98.4 F (36.9 C)   Vitals:   02/15/17 0916 02/15/17 1030 02/15/17 1040 02/15/17 1102  BP: (!) 171/46 101/75 (!) 137/52 (!) 141/39  Pulse:  86 80 82  Resp: 15 19 18 17   Temp: 98.1 F (36.7 C) 97.7 F (36.5 C)  98.4 F (36.9 C)  TempSrc: Oral Oral  Oral  SpO2: 100% 100% 100% 100%  Weight:      Height:       General: Pt is alert, follows commands appropriately, not in acute distress Cardiovascular: Regular rate and rhythm, S1/S2 +, no murmurs, no rubs, no gallops Respiratory: Clear to auscultation bilaterally, no wheezing, no crackles, no rhonchi Abdominal: Soft, non tender, non distended, bowel sounds +, no guarding  Discharge Instructions   Allergies as of 02/15/2017      Reactions   Codeine Nausea And Vomiting   REACTION: nausea/vomiting   Sulfonamide Derivatives Itching, Rash       Medication List    TAKE these medications   amLODipine 10 MG tablet Commonly known as:  NORVASC Take 10 mg by mouth daily.   aspirin 81 MG tablet Take 81 mg by mouth daily.   atenolol 50 MG tablet Commonly known as:  TENORMIN Take 50 mg by mouth daily.   CALCIUM-VITAMIN D PO Take 1 tablet by mouth 2 (two) times daily.   cholecalciferol 400 units Tabs tablet Commonly known as:  VITAMIN D Take 400 Units by mouth daily.   Diclofenac Sodium CR 100 MG 24 hr tablet Take 100 mg by mouth daily.   fluticasone 50 MCG/ACT nasal spray Commonly known as:  FLONASE Place 2 sprays into both nostrils at bedtime.   HYDROcodone-acetaminophen 5-325 MG tablet Commonly known as:  NORCO/VICODIN Take 1 tablet by mouth every 6 (six) hours as needed for moderate pain.   loratadine 10 MG tablet Commonly known as:  CLARITIN Take 10 mg by mouth daily.   meclizine 25 MG tablet Commonly known as:  ANTIVERT Take 25 mg by mouth 3 (three) times daily as needed for dizziness.   multivitamin with minerals Tabs tablet Take 1 tablet by mouth daily.   nitroGLYCERIN 0.4 MG SL tablet Commonly known as:  NITROSTAT Place 0.4 mg under the tongue every 5 (five) minutes as needed for chest pain.   pantoprazole 40 MG tablet Commonly known as:  PROTONIX Take 1 tablet (40 mg total) by mouth daily at 6 (six) AM. Start taking on:  02/16/2017   telmisartan-hydrochlorothiazide 80-25 MG tablet Commonly known as:  MICARDIS HCT Take 1 tablet by mouth daily.   vitamin B-12 1000 MCG tablet Commonly known as:  CYANOCOBALAMIN Take 1,000 mcg by mouth daily.   vitamin E 400 UNIT capsule Take 400 Units by mouth daily.      Follow-up Information    ARONSON,RICHARD A, MD Follow up.   Specialty:  Internal Medicine Contact information: [place] [address] [phone]          The results of significant diagnostics from this hospitalization (including imaging, microbiology, ancillary  and laboratory) are listed below for reference.  Microbiology: No results found for this or any previous visit (from the past 240 hour(s)).   Labs: Basic Metabolic Panel:  Recent Labs Lab 02/13/17 1455 02/14/17 0257 02/15/17 0340  NA 134* 134* 135  K 4.2 4.2 4.2  CL 96* 101 101  CO2 24 22 23   GLUCOSE 135* 98 89  BUN 64* 57* 42*  CREATININE 1.90* 1.66* 1.49*  CALCIUM 9.4 8.8* 9.0  MG  --  2.4  --   PHOS  --  4.1  --    Liver Function Tests:  Recent Labs Lab 02/13/17 2135 02/14/17 0257  AST 13* 11*  ALT 12* 10*  ALKPHOS 110 97  BILITOT 0.4 0.9  PROT 6.9 6.4*  ALBUMIN 2.6* 2.5*   CBC:  Recent Labs Lab 02/13/17 1455 02/14/17 0257 02/15/17 0340  WBC 12.8* 9.9 9.8  NEUTROABS  --  6.5  --   HGB 6.4* 9.3* 10.1*  HCT 21.3* 29.2* 31.9*  MCV 81.3 78.7 79.4  PLT 459* 325 381   Cardiac Enzymes:  Recent Labs Lab 02/13/17 1503 02/13/17 2135 02/14/17 0257  TROPONINI 0.03* <0.03 0.03*   CBG:  Recent Labs Lab 02/13/17 1640  GLUCAP 117*     SIGNED: Time coordinating discharge: 30 minutes  Faye Ramsay, MD  [facility] 02/15/2017, 11:14 AM Pager [phone]  If 7PM-7AM, please contact night-coverage [email or web address] Password [email or web address]

## 2017-02-15 NOTE — Progress Notes (Signed)
Patient ID: Amy [name], female   DOB: 1936/03/02, 81 y.o.   MRN: [id number]    PROGRESS NOTE  Amy [name]  VEH:[id number] DOB: Apr 12, 1936 DOA: 02/13/2017  PCP: Geoffery Lyons, MD   Brief Narrative:  81 y.o. female with left subclavian stenosis, carotid artery dz.  s/p left CEA.  HTN.  HLD, egenerative spinal and disc disease. Status post 3 separate surgeries on her low back, s/p right adrenal gland resection, 12/2009 Colonoscopy, screening.  Pedunculated polyp in the sigmoid colon - removed (tubular adenoma).  Moderate diverticulosis in the left colon, presented to [facility] with main concern of progressively worsening fatigue almost one week in duration. This has been associated with dizziness and poor oral intake. She was seen by primary care physician in the office, had blood work done and her hemoglobin was 6.4  Assessment & Plan:  Symptomatic acute blood loss anemia, IDA - SP PRBC x 2.  FOBT negative - Hg up from 6.4 --> 9.3 --> 10.1 this AM and pt reports feeling better - GI consulted  - pt will need colonoscopy plus minus upper endoscopy today   Acute kidney injury - in the setting of acute blood loss anemia - improving from 1.9 --> 1.66 --> 1.49 this AM - repeat BMP in AM  HTN - continue Norvasc and Atenolol   Minimal elevation of trop - 0.03, no cheat pain and suspect this to be related to demand ischemia - pt with no CP this AM - would not cycle CE's at this point unless pt with CP  Leukocytosis, thrombocytosis  - possibly reactive - no signs of an infectious etiology - now resolved, WBC and Plt both WNL - repeat CBC in AM  DVT prophylaxis: SCD's Code Status: DNR Family Communication: Patient at bedside  Disposition Plan: once GI team clears, will go home  Consultants:   GI  Procedures:   None  Antimicrobials:   None  Subjective: No events overnight, pt reports feeling better.   Objective: Vitals:   02/14/17 1739 02/14/17 2031 02/15/17  0422 02/15/17 0916  BP: (!) 130/42 (!) 133/40 (!) 164/46 (!) 171/46  Pulse: 70 71 68   Resp: _0 Temp: 98.2 F (36.8 C) 98.2 F (36.8 C) 98.3 F (36.8 C) 98.1 F (36.7 C)  TempSrc: Oral Oral Oral Oral  SpO2: 99% 99% 95% 100%  Weight:  66.3 kg (146 lb 3.2 oz)    Height:        Intake/Output Summary (Last 24 hours) at 02/15/17 1031 Last data filed at 02/15/17 1024  Gross per 24 hour  Intake             1655 ml  Output             1300 ml  Net              355 ml   Filed Weights   02/13/17 1420 02/13/17 2122 02/14/17 2031  Weight: 68 kg (150 lb) 67.1 kg (148 lb) 66.3 kg (146 lb 3.2 oz)    Examination:  General exam: Appears calm and comfortable  Respiratory system: Clear to auscultation. Respiratory effort normal. Cardiovascular system: S1 & S2 heard, RRR. No JVD, rubs, gallops or clicks.  Gastrointestinal system: Abdomen is nondistended, soft and nontender. No organomegaly or masses felt. Normal bowel sounds heard. Central nervous system: Alert and oriented. No focal neurological deficits. Extremities: Symmetric 5 x 5 power.  Data Reviewed: I have personally reviewed following labs  and imaging studies  CBC:  Recent Labs Lab 02/13/17 1455 02/14/17 0257 02/15/17 0340  WBC 12.8* 9.9 9.8  NEUTROABS  --  6.5  --   HGB 6.4* 9.3* 10.1*  HCT 21.3* 29.2* 31.9*  MCV 81.3 78.7 79.4  PLT 459* 325 945   Basic Metabolic Panel:  Recent Labs Lab 02/13/17 1455 02/14/17 0257 02/15/17 0340  NA 134* 134* 135  K 4.2 4.2 4.2  CL 96* 101 101  CO2 _0 GLUCOSE 135* 98 89  BUN 64* 57* 42*  CREATININE 1.90* 1.66* 1.49*  CALCIUM 9.4 8.8* 9.0  MG  --  2.4  --   PHOS  --  4.1  --    Liver Function Tests:  Recent Labs Lab 02/13/17 2135 02/14/17 0257  AST 13* 11*  ALT 12* 10*  ALKPHOS 110 97  BILITOT 0.4 0.9  PROT 6.9 6.4*  ALBUMIN 2.6* 2.5*   Coagulation Profile:  Recent Labs Lab 02/14/17 0257  INR 1.11   Cardiac Enzymes:  Recent Labs Lab  02/13/17 1503 02/13/17 2135 02/14/17 0257  TROPONINI 0.03* <0.03 0.03*   CBG:  Recent Labs Lab 02/13/17 1640  GLUCAP 117*   Thyroid Function Tests:  Recent Labs  02/14/17 0257  TSH 1.364   Anemia Panel:  Recent Labs  02/13/17 1627  VITAMINB12 >7,500*  FOLATE 15.1  FERRITIN 31  TIBC 322  IRON 11*  RETICCTPCT 3.9*   Urine analysis:    Component Value Date/Time   COLORURINE YELLOW 02/13/2017 1725   APPEARANCEUR HAZY (A) 02/13/2017 1725   LABSPEC 1.012 02/13/2017 1725   PHURINE 5.0 02/13/2017 1725   GLUCOSEU NEGATIVE 02/13/2017 1725   HGBUR NEGATIVE 02/13/2017 1725   BILIRUBINUR NEGATIVE 02/13/2017 1725   KETONESUR NEGATIVE 02/13/2017 1725   PROTEINUR NEGATIVE 02/13/2017 1725   UROBILINOGEN 0.2 09/24/2010 1310   NITRITE NEGATIVE 02/13/2017 1725   LEUKOCYTESUR NEGATIVE 02/13/2017 1725   Radiology Studies: Ct Abdomen Pelvis Wo Contrast  Result Date: 02/13/2017 CLINICAL DATA:  Anemia, generalized weakness, back pain, history hypertension, just began new blood pressure medicine, hypercholesterolemia, former smoker, assessment for malignancy EXAM: CT CHEST, ABDOMEN AND PELVIS WITHOUT CONTRAST TECHNIQUE: Multidetector CT imaging of the chest, abdomen and pelvis was performed following the standard protocol without IV contrast. Sagittal and coronal MPR images reconstructed from axial data set. No oral contrast administered. COMPARISON:  CT abdomen and pelvis 12/30/2008 FINDINGS: CT CHEST FINDINGS Cardiovascular: Atherosclerotic calcifications aorta, coronary arteries and proximal great vessels. Aorta normal caliber. No pericardial effusion or definite cardiac abnormality Mediastinum/Nodes: Visualized base of cervical region normal appearance. No thoracic adenopathy. Esophagus unremarkable Lungs/Pleura: Minimal atelectasis versus scarring at base of RIGHT middle lobe medially. No acute infiltrate, pleural effusion or pneumothorax. Tiny noncalcified nodules throughout both lungs  3 mm diameter or less, nonspecific. No dominant mass or nodule identified. Musculoskeletal: Osseous demineralization. CT ABDOMEN PELVIS FINDINGS Hepatobiliary: Question gallstones within gallbladder. Liver unremarkable. CBD minimally prominent 9 mm diameter. Pancreas: Normal appearance Spleen: Normal appearance. Probable small splenule adjacent to spleen. Adrenals/Urinary Tract: Interval RIGHT adrenalectomy and resection of large myelolipoma. Dense calcification within LEFT adrenal gland representing either prior hemorrhage or infection. Cyst anterior aspect upper pole LEFT kidney 2.3 x 2.1 cm increased since previous exam. Kidneys, ureters, and bladder otherwise normal appearance. Stomach/Bowel: Normal appendix. Sigmoid diverticulosis without evidence of diverticulitis. Stomach and bowel loops otherwise normal appearance. Vascular/Lymphatic: Extensive atherosclerotic calcification aorta and branch vessels. Upper normal caliber mid abdominal aorta 2.8 x 2.6 cm image 66. Reproductive:  Uterus surgically absent. Question atrophic RIGHT ovary with nonvisualization of LEFT ovary. Other: No free air or free fluid. No inflammatory process or hernia. Musculoskeletal: Scattered degenerative changes of the thoracolumbar spine with disc space obliteration at L4-L5 with grade 2 spondylolisthesis and question fusion L4-L5. IMPRESSION: Tiny BILATERAL pulmonary nodules 3 mm diameter or less, nonspecific and of uncertain etiology, recommendation below. No follow-up needed if patient is low-risk (and has no known or suspected primary neoplasm). Non-contrast chest CT can be considered in 12 months if patient is high-risk. This recommendation follows the consensus statement: Guidelines for Management of Incidental Pulmonary Nodules Detected on CT Images: From the Fleischner Society 2017; Radiology 2017; 284:228-243. Extensive atherosclerotic disease including aortic atherosclerosis and coronary arterial calcification. Question  cholelithiasis. Sigmoid diverticulosis without evidence of diverticulitis. Interval RIGHT adrenalectomy with evidence of prior LEFT adrenal hemorrhage or infection. Electronically Signed   By: Lavonia Dana M.D.   On: 02/13/2017 17:28   Ct Chest Wo Contrast  Result Date: 02/13/2017 CLINICAL DATA:  Anemia, generalized weakness, back pain, history hypertension, just began new blood pressure medicine, hypercholesterolemia, former smoker, assessment for malignancy EXAM: CT CHEST, ABDOMEN AND PELVIS WITHOUT CONTRAST TECHNIQUE: Multidetector CT imaging of the chest, abdomen and pelvis was performed following the standard protocol without IV contrast. Sagittal and coronal MPR images reconstructed from axial data set. No oral contrast administered. COMPARISON:  CT abdomen and pelvis 12/30/2008 FINDINGS: CT CHEST FINDINGS Cardiovascular: Atherosclerotic calcifications aorta, coronary arteries and proximal great vessels. Aorta normal caliber. No pericardial effusion or definite cardiac abnormality Mediastinum/Nodes: Visualized base of cervical region normal appearance. No thoracic adenopathy. Esophagus unremarkable Lungs/Pleura: Minimal atelectasis versus scarring at base of RIGHT middle lobe medially. No acute infiltrate, pleural effusion or pneumothorax. Tiny noncalcified nodules throughout both lungs 3 mm diameter or less, nonspecific. No dominant mass or nodule identified. Musculoskeletal: Osseous demineralization. CT ABDOMEN PELVIS FINDINGS Hepatobiliary: Question gallstones within gallbladder. Liver unremarkable. CBD minimally prominent 9 mm diameter. Pancreas: Normal appearance Spleen: Normal appearance. Probable small splenule adjacent to spleen. Adrenals/Urinary Tract: Interval RIGHT adrenalectomy and resection of large myelolipoma. Dense calcification within LEFT adrenal gland representing either prior hemorrhage or infection. Cyst anterior aspect upper pole LEFT kidney 2.3 x 2.1 cm increased since previous exam.  Kidneys, ureters, and bladder otherwise normal appearance. Stomach/Bowel: Normal appendix. Sigmoid diverticulosis without evidence of diverticulitis. Stomach and bowel loops otherwise normal appearance. Vascular/Lymphatic: Extensive atherosclerotic calcification aorta and branch vessels. Upper normal caliber mid abdominal aorta 2.8 x 2.6 cm image 66. Reproductive: Uterus surgically absent. Question atrophic RIGHT ovary with nonvisualization of LEFT ovary. Other: No free air or free fluid. No inflammatory process or hernia. Musculoskeletal: Scattered degenerative changes of the thoracolumbar spine with disc space obliteration at L4-L5 with grade 2 spondylolisthesis and question fusion L4-L5. IMPRESSION: Tiny BILATERAL pulmonary nodules 3 mm diameter or less, nonspecific and of uncertain etiology, recommendation below. No follow-up needed if patient is low-risk (and has no known or suspected primary neoplasm). Non-contrast chest CT can be considered in 12 months if patient is high-risk. This recommendation follows the consensus statement: Guidelines for Management of Incidental Pulmonary Nodules Detected on CT Images: From the Fleischner Society 2017; Radiology 2017; 284:228-243. Extensive atherosclerotic disease including aortic atherosclerosis and coronary arterial calcification. Question cholelithiasis. Sigmoid diverticulosis without evidence of diverticulitis. Interval RIGHT adrenalectomy with evidence of prior LEFT adrenal hemorrhage or infection. Electronically Signed   By: Lavonia Dana M.D.   On: 02/13/2017 17:28    Scheduled Meds: . [MAR Hold] amLODipine  10  mg Oral Daily  . [MAR Hold] atenolol  50 mg Oral Daily  . [MAR Hold] loratadine  10 mg Oral Daily  . [MAR Hold] pantoprazole  40 mg Oral Q0600  . [MAR Hold] peg 3350 powder  0.5 kit Oral Once  . [MAR Hold] senna  1 tablet Oral BID  . [MAR Hold] sodium chloride flush  3 mL Intravenous Q12H   Continuous Infusions: . sodium chloride    . lactated  ringers 10 mL/hr at 02/15/17 0919     LOS: 0 days   Time spent: 20 minutes   Faye Ramsay, MD [facility] Pager [phone]  If 7PM-7AM, please contact night-coverage [email or web address] Password [facility] 02/15/2017, 10:31 AM

## 2017-02-17 ENCOUNTER — Other Ambulatory Visit: Payer: Self-pay

## 2017-02-17 ENCOUNTER — Encounter (HOSPITAL_COMMUNITY): Payer: Self-pay | Admitting: Gastroenterology

## 2017-02-17 DIAGNOSIS — D649 Anemia, unspecified: Secondary | ICD-10-CM

## 2017-04-05 ENCOUNTER — Other Ambulatory Visit (INDEPENDENT_AMBULATORY_CARE_PROVIDER_SITE_OTHER): Payer: Medicare Other

## 2017-04-05 DIAGNOSIS — D649 Anemia, unspecified: Secondary | ICD-10-CM | POA: Diagnosis not present

## 2017-04-05 LAB — CBC WITH DIFFERENTIAL/PLATELET
BASOS ABS: 0 10*3/uL (ref 0.0–0.1)
Basophils Relative: 0.4 % (ref 0.0–3.0)
EOS ABS: 0.3 10*3/uL (ref 0.0–0.7)
Eosinophils Relative: 2.8 % (ref 0.0–5.0)
HCT: 22.3 % — CL (ref 36.0–46.0)
Hemoglobin: 7.1 g/dL — CL (ref 12.0–15.0)
LYMPHS ABS: 2.5 10*3/uL (ref 0.7–4.0)
LYMPHS PCT: 23.7 % (ref 12.0–46.0)
MCHC: 31.6 g/dL (ref 30.0–36.0)
MCV: 83.4 fl (ref 78.0–100.0)
Monocytes Absolute: 1.1 10*3/uL — ABNORMAL HIGH (ref 0.1–1.0)
Monocytes Relative: 9.9 % (ref 3.0–12.0)
NEUTROS ABS: 6.7 10*3/uL (ref 1.4–7.7)
NEUTROS PCT: 63.2 % (ref 43.0–77.0)
PLATELETS: 390 10*3/uL (ref 150.0–400.0)
RBC: 2.67 Mil/uL — ABNORMAL LOW (ref 3.87–5.11)
RDW: 18.5 % — ABNORMAL HIGH (ref 11.5–15.5)
WBC: 10.6 10*3/uL — ABNORMAL HIGH (ref 4.0–10.5)

## 2017-04-06 ENCOUNTER — Other Ambulatory Visit: Payer: Self-pay

## 2017-04-06 DIAGNOSIS — D509 Iron deficiency anemia, unspecified: Secondary | ICD-10-CM

## 2017-04-07 ENCOUNTER — Encounter: Payer: Self-pay | Admitting: Internal Medicine

## 2017-04-07 ENCOUNTER — Ambulatory Visit (INDEPENDENT_AMBULATORY_CARE_PROVIDER_SITE_OTHER): Payer: Medicare Other | Admitting: Internal Medicine

## 2017-04-07 VITALS — BP 116/30 | HR 92 | Ht 65.0 in | Wt 146.5 lb

## 2017-04-07 DIAGNOSIS — K633 Ulcer of intestine: Secondary | ICD-10-CM | POA: Diagnosis not present

## 2017-04-07 DIAGNOSIS — D509 Iron deficiency anemia, unspecified: Secondary | ICD-10-CM

## 2017-04-07 DIAGNOSIS — R195 Other fecal abnormalities: Secondary | ICD-10-CM | POA: Diagnosis not present

## 2017-04-07 DIAGNOSIS — N183 Chronic kidney disease, stage 3 unspecified: Secondary | ICD-10-CM

## 2017-04-07 MED ORDER — PANTOPRAZOLE SODIUM 40 MG PO TBEC: 40.0000 mg | DELAYED_RELEASE_TABLET | Freq: Every day | ORAL | 11 refills | Status: DC

## 2017-04-07 NOTE — Patient Instructions (Addendum)
We have sent the following medications to your pharmacy for you to pick up at your convenience:  Protonix  Discontinue Diclofenac  Keep your bowels regulated  [facility] at [address] [place], [place] [address]  [phone]    Dr. Truitt Merle  04-11-2017 at 1:00pm;  Arrive 12:45pm

## 2017-04-07 NOTE — Progress Notes (Signed)
HISTORY OF PRESENT ILLNESS:  Amy [name] is a 81 y.o. female with multiple significant medical problems including hypertension, arthritis, hyperlipidemia, and chronic renal insufficiency. I have not seen the patient since 2011 when she underwent screening colonoscopy and was found to have a diminutive adenoma. She presents today for follow-up after being hospitalized 02/13/2017 with severe symptomatic anemia. Admission hemoglobin was 6.4 with MCV of 81. Iron studies consistent with iron deficiency with ferritin level of 31, iron 11, iron saturation 3%, and TIBC 322. Patient also has chronic renal insufficiency with admission creatinine 1.9 and estimated GFR 28. Stool was Hemoccult negative. Colonoscopy was performed by Dr. Ardis [name] and revealed multiple ulcers in the right colon. Biopsies revealed ulceration. The etiology was felt secondary to chronic NSAID use for her arthritis. No EGD. For her arthritis she takes diclofenac daily and occasional ibuprofen. She also uses Vicodin sparingly. At the time of discharge she was not told to discontinue her NSAIDs normal she placed on iron. She was seen in follow-up 2 weeks later and placed on iron 325 mg daily. Since that time she has had progressive weakness. Her first follow-up CBC since hospitalization was yesterday. Her hemoglobin was 7.1. I had the patient contacted and told her to stop NSAIDs except for baby aspirin and increase iron to 3 times daily. She denies melena or hematochezia. She does tell me that she was placed on pantoprazole during her hospital stay. She took this for one month. She did notice that previous problems with abdominal upset or discomfort were resolved on PPI and have returned since. GI review of systems is otherwise negative. She is accompanied today by her daughter. Hospital records, x-rays, labs, endoscopy reports all reviewed.  REVIEW OF SYSTEMS:  All non-GI ROS negative except for arthritis, back pain, fatigue, hearing problems,  irregular heartbeat, shortness of breath  Past Medical History:  Diagnosis Date  . Arthritis   . HTN (hypertension)   . Hypercholesterolemia     Past Surgical History:  Procedure Laterality Date  . BACK SURGERY    . CARDIAC CATHETERIZATION  01-02-2009   The anterior descending artery is moderate  in size. There is an eccentric 60-70% proximal stenosis followed by 50-60% mid stenosis. EF 75-80%  . CARDIOVASCULAR STRESS TEST  12-26-2008   EF 65%  . CATARACT EXTRACTION    . COLONOSCOPY N/A 02/15/2017   Procedure: COLONOSCOPY;  Surgeon: Amy Banister, MD;  Location: [facility];  Service: Endoscopy;  Laterality: N/A;  . CYSTECTOMY     Right Hand  . HEMORROIDECTOMY    . TONSILLECTOMY    . TOTAL ABDOMINAL HYSTERECTOMY      Social History Amy [name]  reports that she has quit smoking. She has never used smokeless tobacco. She reports that she does not drink alcohol or use drugs.  family history includes Diabetes in her father and other.  Allergies  Allergen Reactions  . Codeine Nausea And Vomiting    REACTION: nausea/vomiting  . Sulfonamide Derivatives Itching and Rash       PHYSICAL EXAMINATION: Vital signs: BP (!) 116/30 (BP Location: Right Arm, Patient Position: Sitting, Cuff Size: Normal)   Pulse 92   Ht 5\' 5"  (1.651 m)   Wt 146 lb 8 oz (66.5 kg)   BMI 24.38 kg/m   Constitutional: Elderly but generally well-appearing, no acute distress. Moves slowly Psychiatric: alert and oriented x3, cooperative Eyes: extraocular movements intact, anicteric, conjunctiva pale Mouth: oral pharynx moist, no lesions. No tell and ectasias Neck: supple  without primary Lymph: no lymphadenopathy Cardiovascular: heart regular rate and rhythm, systolic murmur present Lungs: clear to auscultation bilaterally Abdomen: soft, nontender, nondistended, no obvious ascites, no peritoneal signs, normal bowel sounds, no organomegaly Rectal: Negative during recent hospitalization. Not  repeated Extremities: no clubbing, cyanosis or lower extremity edema bilaterally. Deformities of arthritis in the hands bilaterally Skin: no relevant lesions on visible extremities Neuro: No focal deficits. Cranial nerves intact  ASSESSMENT:  #1. Recurrent symptomatic anemia. Suspect combination of iron deficiency, renal insufficiency, and? Other. Recurrence of significant anemia without GI bleeding warrants hematology opinion #2. History of multiple colonic ulcers as cause for iron deficiency. Etiology NSAID related #3. Dyspeptic symptoms off PPI. May have had NSAID related gastritis or ulcers. This patient should be on PPI chronically #4. Chronic renal insufficiency #5. Severe arthritis   PLAN:  #1. Told to stop diclofenac and other NSAIDs #2. Told that she should continue baby aspirin as it may be medically important. She should check with her PCP in this regard #3. Encouraged to use Vicodin more frequently not to exceed recommended dosages and back off if it causes significant side effects such as drowsiness, confusion. I did warn her about constipation #4. Iron sulfate 325 mg 3 times daily. Prescribed #5. Prescribed pantoprazole 40 mg daily. Should take indefinitely #6. Hematology consultation arranged with Dr. Burr [name] this Tuesday, June 5 at 1 PM. #7. Return to Dr. Reynaldo [name] regarding management of arthritis pain with something other than NSAIDs, to discuss the importance of baby aspirin, and discuss blood pressure medication (which they asked me about today)  40 minutes spent face-to-face with the patient. Greater than 50% a time use for counseling regarding her anemia, colonic ulceration, renal insufficiency, treatment plans and recommendations. Multiple questions from patient and daughter answered to their satisfaction as well as. A copy of this office note has been sent to Dr. Reynaldo [name]

## 2017-04-10 NOTE — Progress Notes (Signed)
[place]  [phone]) [phone] Fax:(336) [place] consult Note   Patient Care Team: Burnard Bunting, MD as PCP - General (Internal Medicine) 04/11/2017  REFERRAL PHYSICIAN: Dr. Henrene Pastor   CHIEF COMPLAINTS/PURPOSE OF CONSULTATION:  Anemia   HISTORY OF PRESENTING ILLNESS:  Amy [name] 81 y.o. female is here because of her anemia. She was referred by her gastroenterologist Dr. Henrene Pastor. She presents to my clinic with her daughter today.  She was found to have anemia since 5-6 months ago, she has developed was are fatigue in the past few months, she presented to Hospital on 02/13/2017 for worsening fatigue. CBC showed anemia with hemoglobin 6.4, MCV 81.3, WBC and platelet count was slightly elevated. Our study showed low iron and saturation, she was given 2 units of blood transfusion. She underwent colonoscopy in the hospital, which showed multiple ulcers in the colon, felt to be related to her NSAIDs which she took for arthritis. She started ferrous sulfate 1 tablet daily after hospital discharge. She followed with gastroenterologist Dr. Henrene Pastor in the office last week, repeat the CBC showed hemoglobin 7.1, she was referred to [place] for urgent consult for her anemia. Dr. Henrene Pastor increased her iron pill to 3 tablets a day.  She denies recent chest pain on exertion, she has mild shortness of breath on moderate to heavy exertion, no pre-syncopal episodes, or palpitations. She had not noticed any recent bleeding such as epistaxis, hematuria or hematochezia She used to take multiple NSAIDs for her arthritis, which has been held since early April. She is still on baby aspirin daily. She had no prior history or diagnosis of cancer. Her age appropriate screening programs are up-to-date. She denies any pica and eats a variety of diet. She never donated blood or received blood transfusion  She denies recent weight loss, no fever, chills, night sweats, or skin itchiness.    MEDICAL HISTORY:  Past Medical History:  Diagnosis Date  . Arthritis   . HTN (hypertension)   . Hypercholesterolemia     SURGICAL HISTORY: Past Surgical History:  Procedure Laterality Date  . BACK SURGERY    . CARDIAC CATHETERIZATION  01-02-2009   The anterior descending artery is moderate  in size. There is an eccentric 60-70% proximal stenosis followed by 50-60% mid stenosis. EF 75-80%  . CARDIOVASCULAR STRESS TEST  12-26-2008   EF 65%  . CATARACT EXTRACTION    . COLONOSCOPY N/A 02/15/2017   Procedure: COLONOSCOPY;  Surgeon: Milus Banister, MD;  Location: [facility];  Service: Endoscopy;  Laterality: N/A;  . CYSTECTOMY     Right Hand  . HEMORROIDECTOMY    . TONSILLECTOMY    . TOTAL ABDOMINAL HYSTERECTOMY      SOCIAL HISTORY: Social History   Social History  . Marital status: Widowed    Spouse name: N/A  . Number of children: N/A  . Years of education: N/A   Occupational History  . Not on file.   Social History Main Topics  . Smoking status: Former Smoker    Packs/day: 0.25    Years: 20.00  . Smokeless tobacco: Never Used     Comment: Quite 2009  . Alcohol use No  . Drug use: No  . Sexual activity: Not on file   Other Topics Concern  . Not on file   Social History Narrative  . No narrative on file    FAMILY HISTORY: Family History  Problem Relation Age of Onset  . Diabetes Father   . Diabetes  Other   . Cancer Maternal Uncle        unknown type cancer   . CAD Neg Hx   . Stroke Neg Hx     ALLERGIES:  is allergic to codeine and sulfonamide derivatives.  MEDICATIONS:  Current Outpatient Prescriptions  Medication Sig Dispense Refill  . amLODipine (NORVASC) 10 MG tablet Take 10 mg by mouth daily.    Marland Kitchen aspirin 81 MG tablet Take 81 mg by mouth daily.    Marland Kitchen atenolol (TENORMIN) 50 MG tablet Take 50 mg by mouth daily.    Marland Kitchen CALCIUM-VITAMIN D PO Take 1 tablet by mouth 2 (two) times daily.    . cholecalciferol (VITAMIN D) 400 units TABS tablet Take 400  Units by mouth daily.    . ferrous sulfate 325 (65 FE) MG tablet Take 325 mg by mouth 3 (three) times daily with meals.     . fluticasone (FLONASE) 50 MCG/ACT nasal spray Place 2 sprays into both nostrils at bedtime.    Marland Kitchen HYDROcodone-acetaminophen (NORCO/VICODIN) 5-325 MG tablet Take 1 tablet by mouth every 8 (eight) hours as needed for moderate pain.     Marland Kitchen loratadine (CLARITIN) 10 MG tablet Take 10 mg by mouth daily.    . meclizine (ANTIVERT) 25 MG tablet Take 25 mg by mouth 3 (three) times daily as needed for dizziness.    . Multiple Vitamin (MULTIVITAMIN WITH MINERALS) TABS tablet Take 1 tablet by mouth daily.    . pantoprazole (PROTONIX) 40 MG tablet Take 1 tablet (40 mg total) by mouth daily at 6 (six) AM. 30 tablet 11  . telmisartan-hydrochlorothiazide (MICARDIS HCT) 80-25 MG tablet Take 1 tablet by mouth daily.    . vitamin B-12 (CYANOCOBALAMIN) 1000 MCG tablet Take 1,000 mcg by mouth daily.     . vitamin E 400 UNIT capsule Take 400 Units by mouth daily.    . nitroGLYCERIN (NITROSTAT) 0.4 MG SL tablet Place 0.4 mg under the tongue every 5 (five) minutes as needed for chest pain.      No current facility-administered medications for this visit.     REVIEW OF SYSTEMS:   Constitutional: Denies fevers, chills or abnormal night sweats, (+) fatigue  Eyes: Denies blurriness of vision, double vision or watery eyes Ears, nose, mouth, throat, and face: Denies mucositis or sore throat Respiratory: Denies cough, dyspnea or wheezes Cardiovascular: Denies palpitation, chest discomfort or lower extremity swelling Gastrointestinal:  Denies nausea, heartburn or change in bowel habits Skin: Denies abnormal skin rashes Lymphatics: Denies new lymphadenopathy or easy bruising Neurological:Denies numbness, tingling or new weaknesses Behavioral/Psych: Mood is stable, no new changes  All other systems were reviewed with the patient and are negative.  PHYSICAL EXAMINATION:  Vitals:   04/11/17 1255    BP: (!) 157/40  Pulse: 86  Resp: 18  Temp: 97.9 F (36.6 C)   Filed Weights   04/11/17 1255  Weight: 148 lb 4.8 oz (67.3 kg)    GENERAL:alert, no distress and comfortable, appears to be pale  SKIN: skin color, texture, turgor are normal, no rashes or significant lesions EYES: normal, conjunctiva are pink and non-injected, sclera clear OROPHARYNX:no exudate, no erythema and lips, buccal mucosa, and tongue normal  NECK: supple, thyroid normal size, non-tender, without nodularity LYMPH:  no palpable lymphadenopathy in the cervical, axillary or inguinal LUNGS: clear to auscultation and percussion with normal breathing effort HEART: regular rate & rhythm and no murmurs and no lower extremity edema ABDOMEN:abdomen soft, non-tender and normal bowel sounds Musculoskeletal:no cyanosis of  digits and no clubbing  PSYCH: alert & oriented x 3 with fluent speech NEURO: no focal motor/sensory deficits  LABORATORY DATA:  I have reviewed the data as listed CBC Latest Ref Rng & Units 04/11/2017 04/05/2017 02/15/2017  WBC 3.9 - 10.3 10e3/uL 10.0 10.6(H) 9.8  Hemoglobin 11.6 - 15.9 g/dL 6.5(LL) 7.1 Repeated and verified X2.(LL) 10.1(L)  Hematocrit 34.8 - 46.6 % 21.7(L) 22.3 Repeated and verified X2.(LL) 31.9(L)  Platelets 145 - 400 10e3/uL 329 390.0 381    CMP Latest Ref Rng & Units 04/11/2017 02/15/2017 02/14/2017  Glucose 70 - 140 mg/dl 105 89 98  BUN 7.0 - 26.0 mg/dL 33.9(H) 42(H) 57(H)  Creatinine 0.6 - 1.1 mg/dL 1.9(H) 1.49(H) 1.66(H)  Sodium 136 - 145 mEq/L 136 135 134(L)  Potassium 3.5 - 5.1 mEq/L 4.8 4.2 4.2  Chloride 101 - 111 mmol/L - 101 101  CO2 22 - 29 mEq/L _0 Calcium 8.4 - 10.4 mg/dL 9.2 9.0 8.8(L)  Total Protein 6.4 - 8.3 g/dL 7.1 - 6.4(L)  Total Bilirubin 0.20 - 1.20 mg/dL 0.22 - 0.9  Alkaline Phos 40 - 150 U/L 107 - 97  AST 5 - 34 U/L 16 - 11(L)  ALT 0 - 55 U/L 16 - 10(L)   Results for GERALDY, AKRIDGE (MRN [id number]) as of 04/11/2017 22:20  Ref. Range 02/13/2017 16:27  04/11/2017 14:26  Iron Latest Ref Range: 41 - 142 ug/dL 11 (L) 12 (L)  UIBC Latest Ref Range: 120 - 384 ug/dL 311 256  TIBC Latest Ref Range: 236 - 444 ug/dL 322 268  %SAT Latest Ref Range: 21 - 57 %  4 (L)  Saturation Ratios Latest Ref Range: 10.4 - 31.8 % 3 (L)   Ferritin Latest Ref Range: 9 - 269 ng/ml 31 52  Folate Latest Ref Range: >5.9 ng/mL 15.1      RADIOGRAPHIC STUDIES: I have personally reviewed the radiological images as listed and agreed with the findings in the report. No results found.  ASSESSMENT & PLAN:  81 year old Caucasian female, with past medical history of hypertension, hypercholesterolemia, arthritis on chronic NSAIDs, presented with worsening anemia  1. Anemia of iron deficiency and anemia of chronic disease  -Her lab reviewed low serum iron, transferrin saturation, and borderline low ferritin, this is consistent with iron deficiency. Likely secondary to chronic ulcers from NSAIDS -However her degree of anemia seem to be out of portion of iron deficiency, she does have chronic renal failure, which likely also contributes to her anemia -Her reticulocyte count today has elevated, however she has normal LDH, normal total bilirubin, haptoglobin is pending, no lab evidence of hemolysis. -Her previous folic acid and P61 level was normal, I'll check methylmalonic acid level, to ruled out a mild B12 deficiency -We also discussed other etiology of anemia, such as MDS, lymphoma, multiple myeloma, etc. She has normal WBC and platelet count, this is less likely, I have ordered SPEP with immunofixation, and light chain levels today -If her anemia does not improve with iron supplement, I may consider a bone marrow biopsy to ruled out MDS -If her Hb remians to be below 9 after adequate iron replacement, I'll consider EPO for anemia of chronic disease -Due to her severe symptomatic anemia, I'll give her 2 units of blood in the next 2 days, and start IV Feraheme this week, for total of  2 doses. -We'll monitor her CBC and iron study closely  2. HTN, arthritis  -Follow-up of his primary care physician, continue medication  3. CKD, stage IV -Follow up with primary care physician, avoid nephrotoxic agents, such as NSAIDs and iv contrast   Plan -lab today -2u RBC blood transfusion [facility] on 6[facility]7 -IV Feraheme later this week and one week later -Repeat lab in 2 weeks, lab and follow-up in 4 weeks   All questions were answered. The patient knows to call the clinic with any problems, questions or concerns. I spent 40 minutes counseling the patient face to face. The total time spent in the appointment was 45 minutes and more than 50% was on counseling.     Truitt Merle, MD 04/11/17

## 2017-04-11 ENCOUNTER — Ambulatory Visit (HOSPITAL_BASED_OUTPATIENT_CLINIC_OR_DEPARTMENT_OTHER): Payer: Medicare Other | Admitting: Hematology

## 2017-04-11 ENCOUNTER — Telehealth: Payer: Self-pay | Admitting: Hematology

## 2017-04-11 ENCOUNTER — Ambulatory Visit (HOSPITAL_BASED_OUTPATIENT_CLINIC_OR_DEPARTMENT_OTHER): Payer: Medicare Other

## 2017-04-11 ENCOUNTER — Ambulatory Visit (HOSPITAL_COMMUNITY)
Admission: RE | Admit: 2017-04-11 | Discharge: 2017-04-11 | Disposition: A | Discharge status: Home / Self Care | Payer: Medicare Other | Source: Ambulatory Visit | Attending: Hematology | Admitting: Hematology

## 2017-04-11 ENCOUNTER — Encounter: Payer: Self-pay | Admitting: Hematology

## 2017-04-11 ENCOUNTER — Telehealth: Payer: Self-pay | Admitting: *Deleted

## 2017-04-11 VITALS — BP 157/40 | HR 86 | Temp 97.9°F | Resp 18 | Ht 65.0 in | Wt 148.3 lb

## 2017-04-11 DIAGNOSIS — D638 Anemia in other chronic diseases classified elsewhere: Secondary | ICD-10-CM

## 2017-04-11 DIAGNOSIS — D500 Iron deficiency anemia secondary to blood loss (chronic): Secondary | ICD-10-CM

## 2017-04-11 DIAGNOSIS — I10 Essential (primary) hypertension: Secondary | ICD-10-CM | POA: Diagnosis not present

## 2017-04-11 DIAGNOSIS — N184 Chronic kidney disease, stage 4 (severe): Secondary | ICD-10-CM | POA: Diagnosis not present

## 2017-04-11 DIAGNOSIS — D509 Iron deficiency anemia, unspecified: Secondary | ICD-10-CM

## 2017-04-11 LAB — CBC & DIFF AND RETIC
BASO%: 0.2 % (ref 0.0–2.0)
Basophils Absolute: 0 10*3/uL (ref 0.0–0.1)
EOS ABS: 0.2 10*3/uL (ref 0.0–0.5)
EOS%: 1.9 % (ref 0.0–7.0)
HCT: 21.7 % — ABNORMAL LOW (ref 34.8–46.6)
HEMOGLOBIN: 6.5 g/dL — AB (ref 11.6–15.9)
Immature Retic Fract: 15.8 % — ABNORMAL HIGH (ref 1.60–10.00)
LYMPH%: 16.9 % (ref 14.0–49.7)
MCH: 26.1 pg (ref 25.1–34.0)
MCHC: 30 g/dL — ABNORMAL LOW (ref 31.5–36.0)
MCV: 87.1 fL (ref 79.5–101.0)
MONO#: 0.9 10*3/uL (ref 0.1–0.9)
MONO%: 9.4 % (ref 0.0–14.0)
NEUT%: 71.6 % (ref 38.4–76.8)
NEUTROS ABS: 7.1 10*3/uL — AB (ref 1.5–6.5)
Platelets: 329 10*3/uL (ref 145–400)
RBC: 2.49 10*6/uL — ABNORMAL LOW (ref 3.70–5.45)
RDW: 17.9 % — ABNORMAL HIGH (ref 11.2–14.5)
Retic %: 6.21 % — ABNORMAL HIGH (ref 0.70–2.10)
Retic Ct Abs: 154.63 10*3/uL — ABNORMAL HIGH (ref 33.70–90.70)
WBC: 10 10*3/uL (ref 3.9–10.3)
lymph#: 1.7 10*3/uL (ref 0.9–3.3)

## 2017-04-11 LAB — COMPREHENSIVE METABOLIC PANEL
ALBUMIN: 2.7 g/dL — AB (ref 3.5–5.0)
ALK PHOS: 107 U/L (ref 40–150)
ALT: 16 U/L (ref 0–55)
AST: 16 U/L (ref 5–34)
Anion Gap: 10 mEq/L (ref 3–11)
BUN: 33.9 mg/dL — ABNORMAL HIGH (ref 7.0–26.0)
CO2: 22 mEq/L (ref 22–29)
Calcium: 9.2 mg/dL (ref 8.4–10.4)
Chloride: 104 mEq/L (ref 98–109)
Creatinine: 1.9 mg/dL — ABNORMAL HIGH (ref 0.6–1.1)
EGFR: 25 mL/min/{1.73_m2} — AB (ref >90)
Glucose: 105 mg/dl (ref 70–140)
POTASSIUM: 4.8 meq/L (ref 3.5–5.1)
Sodium: 136 mEq/L (ref 136–145)
Total Bilirubin: 0.22 mg/dL (ref 0.20–1.20)
Total Protein: 7.1 g/dL (ref 6.4–8.3)

## 2017-04-11 LAB — MORPHOLOGY: PLT EST: ADEQUATE

## 2017-04-11 LAB — ABO/RH: ABO/RH(D): AB POS

## 2017-04-11 LAB — IRON AND TIBC
%SAT: 4 % — ABNORMAL LOW (ref 21–57)
IRON: 12 ug/dL — AB (ref 41–142)
TIBC: 268 ug/dL (ref 236–444)
UIBC: 256 ug/dL (ref 120–384)

## 2017-04-11 LAB — LACTATE DEHYDROGENASE: LDH: 125 U/L (ref 125–245)

## 2017-04-11 LAB — FERRITIN: Ferritin: 52 ng/ml (ref 9–269)

## 2017-04-11 NOTE — Telephone Encounter (Signed)
Dr. Burr Medico reviewed lab results today -  Hgb  6.5  .   Spoke with daughter [name] Schlein and gave her instructions re: 1.    Keep blue armband on for blood transfusion on Thursday 04/13/17. 2.    [facility] at 0930 am on Thursday  04/13/17.  Alf[facility] phone number, directions to [place]. 3.    Pt will receive First dose of Feraheme after blood transfusion on Thursday  04/13/17 at [facility]. Jeanette voiced understanding, and was able to read back instructions to nurse.

## 2017-04-11 NOTE — Telephone Encounter (Signed)
Gave patient avs report and appointments for June and July.  °

## 2017-04-12 LAB — ERYTHROPOIETIN: ERYTHROPOIETIN: 69.1 m[IU]/mL — AB (ref 2.6–18.5)

## 2017-04-12 LAB — HAPTOGLOBIN: HAPTOGLOBIN: 477 mg/dL — AB (ref 34–200)

## 2017-04-12 LAB — KAPPA/LAMBDA LIGHT CHAINS
IG KAPPA FREE LIGHT CHAIN: 81.1 mg/L — AB (ref 3.3–19.4)
IG LAMBDA FREE LIGHT CHAIN: 65.3 mg/L — AB (ref 5.7–26.3)
Kappa/Lambda FluidC Ratio: 1.24 (ref 0.26–1.65)

## 2017-04-13 ENCOUNTER — Ambulatory Visit (HOSPITAL_COMMUNITY)
Admission: RE | Admit: 2017-04-13 | Discharge: 2017-04-13 | Disposition: A | Discharge status: Home / Self Care | Payer: Medicare Other | Source: Ambulatory Visit | Attending: Hematology | Admitting: Hematology

## 2017-04-13 DIAGNOSIS — D638 Anemia in other chronic diseases classified elsewhere: Secondary | ICD-10-CM

## 2017-04-13 DIAGNOSIS — D500 Iron deficiency anemia secondary to blood loss (chronic): Secondary | ICD-10-CM | POA: Diagnosis not present

## 2017-04-13 LAB — PROTEIN ELECTROPHORESIS, SERUM, WITH REFLEX
A/G Ratio: 0.8 (ref 0.7–1.7)
ALBUMIN: 2.8 g/dL — AB (ref 2.9–4.4)
ALPHA 1: 0.4 g/dL (ref 0.0–0.4)
Alpha 2: 1.2 g/dL — ABNORMAL HIGH (ref 0.4–1.0)
BETA: 1 g/dL (ref 0.7–1.3)
GAMMA GLOBULIN: 1 g/dL (ref 0.4–1.8)
Globulin, Total: 3.7 g/dL (ref 2.2–3.9)
Total Protein: 6.5 g/dL (ref 6.0–8.5)

## 2017-04-13 LAB — METHYLMALONIC ACID, SERUM: METHYLMALONIC ACID: 290 nmol/L (ref 0–378)

## 2017-04-13 LAB — PREPARE RBC (CROSSMATCH)

## 2017-04-13 MED ORDER — SODIUM CHLORIDE 0.9 % IV SOLN
250.0000 mL | Freq: Once | INTRAVENOUS | Status: AC
  Administered 2017-04-13: 250 mL via INTRAVENOUS

## 2017-04-13 MED ORDER — ACETAMINOPHEN 325 MG PO TABS
650.0000 mg | ORAL_TABLET | Freq: Once | ORAL | Status: AC
  Administered 2017-04-13: 650 mg via ORAL
  Filled 2017-04-13: qty 2

## 2017-04-13 MED ORDER — SODIUM CHLORIDE 0.9 % IV SOLN
510.0000 mg | Freq: Once | INTRAVENOUS | Status: AC
  Administered 2017-04-13: 510 mg via INTRAVENOUS
  Filled 2017-04-13: qty 17

## 2017-04-13 NOTE — Discharge Instructions (Signed)

## 2017-04-13 NOTE — Progress Notes (Signed)
Diagnosis Association: Iron deficiency anemia due to chronic blood loss (D50.0  Provider: Dr. Burr Medico  Procedure: Pt received 2 units of PRBCs and a bag of Feraheme. Pt remain 30 mins after the infusion of Feraheme to be observed.  Pt tolerated procedure well.  Post procedure: Pt alert, oriented and ambulatory at discharge. D/C instructions given with verbal understanding. Discharged home with daughter.

## 2017-04-14 LAB — TYPE AND SCREEN
ABO/RH(D): AB POS
Antibody Screen: NEGATIVE
UNIT DIVISION: 0
Unit division: 0

## 2017-04-14 LAB — BPAM RBC
BLOOD PRODUCT EXPIRATION DATE: 201806222359
Blood Product Expiration Date: 201806222359
ISSUE DATE / TIME: 201806071016
ISSUE DATE / TIME: 201806071016
Unit Type and Rh: 6200
Unit Type and Rh: 6200

## 2017-04-18 ENCOUNTER — Ambulatory Visit (HOSPITAL_BASED_OUTPATIENT_CLINIC_OR_DEPARTMENT_OTHER): Payer: Medicare Other

## 2017-04-18 VITALS — BP 154/49 | HR 72 | Temp 98.2°F | Resp 20

## 2017-04-18 DIAGNOSIS — D500 Iron deficiency anemia secondary to blood loss (chronic): Secondary | ICD-10-CM

## 2017-04-18 DIAGNOSIS — D509 Iron deficiency anemia, unspecified: Secondary | ICD-10-CM

## 2017-04-18 MED ORDER — SODIUM CHLORIDE 0.9 % IV SOLN
Freq: Once | INTRAVENOUS | Status: AC
  Administered 2017-04-18: 11:00:00 via INTRAVENOUS

## 2017-04-18 MED ORDER — SODIUM CHLORIDE 0.9 % IV SOLN
510.0000 mg | Freq: Once | INTRAVENOUS | Status: AC
  Administered 2017-04-18: 510 mg via INTRAVENOUS
  Filled 2017-04-18: qty 17

## 2017-04-18 NOTE — Patient Instructions (Signed)

## 2017-04-25 ENCOUNTER — Ambulatory Visit: Payer: Medicare Other

## 2017-04-25 ENCOUNTER — Other Ambulatory Visit (HOSPITAL_BASED_OUTPATIENT_CLINIC_OR_DEPARTMENT_OTHER): Payer: Medicare Other

## 2017-04-25 DIAGNOSIS — N184 Chronic kidney disease, stage 4 (severe): Secondary | ICD-10-CM | POA: Diagnosis not present

## 2017-04-25 DIAGNOSIS — D509 Iron deficiency anemia, unspecified: Secondary | ICD-10-CM

## 2017-04-25 DIAGNOSIS — D500 Iron deficiency anemia secondary to blood loss (chronic): Secondary | ICD-10-CM

## 2017-04-25 LAB — CBC & DIFF AND RETIC
BASO%: 0.4 % (ref 0.0–2.0)
BASOS ABS: 0 10*3/uL (ref 0.0–0.1)
EOS%: 2.4 % (ref 0.0–7.0)
Eosinophils Absolute: 0.2 10*3/uL (ref 0.0–0.5)
HEMATOCRIT: 36.7 % (ref 34.8–46.6)
HEMOGLOBIN: 11.5 g/dL — AB (ref 11.6–15.9)
Immature Retic Fract: 0.6 % — ABNORMAL LOW (ref 1.60–10.00)
LYMPH#: 1.9 10*3/uL (ref 0.9–3.3)
LYMPH%: 23.8 % (ref 14.0–49.7)
MCH: 28.1 pg (ref 25.1–34.0)
MCHC: 31.3 g/dL — AB (ref 31.5–36.0)
MCV: 89.7 fL (ref 79.5–101.0)
MONO#: 0.9 10*3/uL (ref 0.1–0.9)
MONO%: 11 % (ref 0.0–14.0)
NEUT#: 5 10*3/uL (ref 1.5–6.5)
NEUT%: 62.4 % (ref 38.4–76.8)
PLATELETS: 263 10*3/uL (ref 145–400)
RBC: 4.09 10*6/uL (ref 3.70–5.45)
RDW: 16.8 % — ABNORMAL HIGH (ref 11.2–14.5)
RETIC %: 2.36 % — AB (ref 0.70–2.10)
Retic Ct Abs: 96.52 10*3/uL — ABNORMAL HIGH (ref 33.70–90.70)
WBC: 8 10*3/uL (ref 3.9–10.3)
nRBC: 0 % (ref 0–0)

## 2017-05-08 ENCOUNTER — Encounter: Payer: Self-pay | Admitting: Family

## 2017-05-08 DIAGNOSIS — D638 Anemia in other chronic diseases classified elsewhere: Secondary | ICD-10-CM | POA: Insufficient documentation

## 2017-05-08 NOTE — Progress Notes (Signed)
[place]  Telephone:(336) [phone] Fax:(336) [phone]  Clinic Follow UpNote   Patient Care Team: Burnard Bunting, MD as PCP - General (Internal Medicine) Truitt Merle, MD as Consulting Physician (Hematology) Irene Shipper, MD as Consulting Physician (Gastroenterology) 05/09/2017  CHIEF COMPLAINTS:  Follow up Anemia   HISTORY OF PRESENTING ILLNESS:  Amy [name] 81 y.o. female is here because of her anemia. She was referred by her gastroenterologist Dr. Henrene Pastor. She presents to my clinic with her daughter today.  She was found to have anemia since 5-6 months ago, she has developed was are fatigue in the past few months, she presented to Hospital on 02/13/2017 for worsening fatigue. CBC showed anemia with hemoglobin 6.4, MCV 81.3, WBC and platelet count was slightly elevated. Our study showed low iron and saturation, she was given 2 units of blood transfusion. She underwent colonoscopy in the hospital, which showed multiple ulcers in the colon, felt to be related to her NSAIDs which she took for arthritis. She started ferrous sulfate 1 tablet daily after hospital discharge. She followed with gastroenterologist Dr. Henrene Pastor in the office last week, repeat the CBC showed hemoglobin 7.1, she was referred to [place] for urgent consult for her anemia. Dr. Henrene Pastor increased her iron pill to 3 tablets a day.  She denies recent chest pain on exertion, she has mild shortness of breath on moderate to heavy exertion, no pre-syncopal episodes, or palpitations. She had not noticed any recent bleeding such as epistaxis, hematuria or hematochezia She used to take multiple NSAIDs for her arthritis, which has been held since early April. She is still on baby aspirin daily. She had no prior history or diagnosis of cancer. Her age appropriate screening programs are up-to-date. She denies any pica and eats a variety of diet. She never donated blood or received blood transfusion  She denies recent weight  loss, no fever, chills, night sweats, or skin itchiness.   She presents to the clinic today for follow up. She has been having back pains. She is able to do her housework throughout the day. After IV iron, she felt a lot better. She has no problems with her iron pills.  Other than that, she is doing well.  MEDICAL HISTORY:  Past Medical History:  Diagnosis Date  . Arthritis   . HTN (hypertension)   . Hypercholesterolemia     SURGICAL HISTORY: Past Surgical History:  Procedure Laterality Date  . BACK SURGERY    . CARDIAC CATHETERIZATION  01-02-2009   The anterior descending artery is moderate  in size. There is an eccentric 60-70% proximal stenosis followed by 50-60% mid stenosis. EF 75-80%  . CARDIOVASCULAR STRESS TEST  12-26-2008   EF 65%  . CATARACT EXTRACTION    . COLONOSCOPY N/A 02/15/2017   Procedure: COLONOSCOPY;  Surgeon: Milus Banister, MD;  Location: [facility];  Service: Endoscopy;  Laterality: N/A;  . CYSTECTOMY     Right Hand  . HEMORROIDECTOMY    . TONSILLECTOMY    . TOTAL ABDOMINAL HYSTERECTOMY      SOCIAL HISTORY: Social History   Social History  . Marital status: Widowed    Spouse name: N/A  . Number of children: N/A  . Years of education: N/A   Occupational History  . Not on file.   Social History Main Topics  . Smoking status: Former Smoker    Packs/day: 0.25    Years: 20.00  . Smokeless tobacco: Never Used     Comment: Quite 2009  .  Alcohol use No  . Drug use: No  . Sexual activity: Not on file   Other Topics Concern  . Not on file   Social History Narrative  . No narrative on file    FAMILY HISTORY: Family History  Problem Relation Age of Onset  . Diabetes Father   . Diabetes Other   . Cancer Maternal Uncle        unknown type cancer   . CAD Neg Hx   . Stroke Neg Hx     ALLERGIES:  is allergic to codeine and sulfonamide derivatives.  MEDICATIONS:  Current Outpatient Prescriptions  Medication Sig Dispense Refill  .  amLODipine (NORVASC) 10 MG tablet Take 10 mg by mouth daily.    Marland Kitchen aspirin 81 MG tablet Take 81 mg by mouth daily.    Marland Kitchen atenolol (TENORMIN) 50 MG tablet Take 50 mg by mouth daily.    Marland Kitchen CALCIUM-VITAMIN D PO Take 1 tablet by mouth 2 (two) times daily.    . cholecalciferol (VITAMIN D) 400 units TABS tablet Take 400 Units by mouth daily.    . ferrous sulfate 325 (65 FE) MG tablet Take 325 mg by mouth 3 (three) times daily with meals.     . fluticasone (FLONASE) 50 MCG/ACT nasal spray Place 2 sprays into both nostrils at bedtime.    Marland Kitchen HYDROcodone-acetaminophen (NORCO/VICODIN) 5-325 MG tablet Take 1 tablet by mouth every 6 (six) hours as needed for moderate pain.     Marland Kitchen loratadine (CLARITIN) 10 MG tablet Take 10 mg by mouth daily.    . meclizine (ANTIVERT) 25 MG tablet Take 25 mg by mouth 3 (three) times daily as needed for dizziness.    . Multiple Vitamin (MULTIVITAMIN WITH MINERALS) TABS tablet Take 1 tablet by mouth daily.    . nitroGLYCERIN (NITROSTAT) 0.4 MG SL tablet Place 0.4 mg under the tongue every 5 (five) minutes as needed for chest pain.     . pantoprazole (PROTONIX) 40 MG tablet Take 1 tablet (40 mg total) by mouth daily at 6 (six) AM. 30 tablet 11  . telmisartan-hydrochlorothiazide (MICARDIS HCT) 80-25 MG tablet Take 1 tablet by mouth daily.    . vitamin B-12 (CYANOCOBALAMIN) 1000 MCG tablet Take 1,000 mcg by mouth daily.     . vitamin E 400 UNIT capsule Take 400 Units by mouth daily.     No current facility-administered medications for this visit.     REVIEW OF SYSTEMS:   Constitutional: Denies fevers, chills or abnormal night sweats, (+)improved fatigue  Eyes: Denies blurriness of vision, double vision or watery eyes Ears, nose, mouth, throat, and face: Denies mucositis or sore throat Respiratory: Denies cough, dyspnea or wheezes Cardiovascular: Denies palpitation, chest discomfort or lower extremity swelling Gastrointestinal:  Denies nausea, heartburn or change in bowel  habits Skin: Denies abnormal skin rashes Lymphatics: Denies new lymphadenopathy or easy bruising Neurological:Denies numbness, tingling or new weaknesses Behavioral/Psych: Mood is stable, no new changes  MSK: (+)lower back pain All other systems were reviewed with the patient and are negative.  PHYSICAL EXAMINATION:  Vitals:   05/09/17 1526  BP: (!) 184/40  Pulse: 82  Resp: 18  Temp: 98.4 F (36.9 C)   Filed Weights   05/09/17 1526  Weight: 149 lb 9.6 oz (67.9 kg)    GENERAL:alert, no distress and comfortable, appears to be pale  SKIN: skin color, texture, turgor are normal, no rashes or significant lesions EYES: normal, conjunctiva are pink and non-injected, sclera clear OROPHARYNX:no exudate, no erythema  and lips, buccal mucosa, and tongue normal  NECK: supple, thyroid normal size, non-tender, without nodularity LYMPH:  no palpable lymphadenopathy in the cervical, axillary or inguinal LUNGS: clear to auscultation and percussion with normal breathing effort HEART: regular rate & rhythm and no murmurs and no lower extremity edema ABDOMEN:abdomen soft, non-tender and normal bowel sounds Musculoskeletal:no cyanosis of digits and no clubbing  PSYCH: alert & oriented x 3 with fluent speech NEURO: no focal motor/sensory deficits  LABORATORY DATA:  I have reviewed the data as listed CBC Latest Ref Rng & Units 05/09/2017 04/25/2017 04/11/2017  WBC 3.9 - 10.3 10e3/uL 8.2 8.0 10.0  Hemoglobin 11.6 - 15.9 g/dL 12.2 11.5(L) 6.5(LL)  Hematocrit 34.8 - 46.6 % 38.3 36.7 21.7(L)  Platelets 145 - 400 10e3/uL 200 263 329    CMP Latest Ref Rng & Units 04/11/2017 04/11/2017 02/15/2017  Glucose 70 - 140 mg/dl 105 - 89  BUN 7.0 - 26.0 mg/dL 33.9(H) - 42(H)  Creatinine 0.6 - 1.1 mg/dL 1.9(H) - 1.49(H)  Sodium 136 - 145 mEq/L 136 - 135  Potassium 3.5 - 5.1 mEq/L 4.8 - 4.2  Chloride 101 - 111 mmol/L - - 101  CO2 22 - 29 mEq/L 22 - 23  Calcium 8.4 - 10.4 mg/dL 9.2 - 9.0  Total Protein 6.0 - 8.5  g/dL 7.1 6.5 -  Total Bilirubin 0.20 - 1.20 mg/dL 0.22 - -  Alkaline Phos 40 - 150 U/L 107 - -  AST 5 - 34 U/L 16 - -  ALT 0 - 55 U/L 16 - -    Results for ELANDRA, POWELL (MRN [id number]) as of 04/11/2017 22:20  Ref. Range 02/13/2017 16:27 04/11/2017 14:26  Iron Latest Ref Range: 41 - 142 ug/dL 11 (L) 12 (L)  UIBC Latest Ref Range: 120 - 384 ug/dL 311 256  TIBC Latest Ref Range: 236 - 444 ug/dL 322 268  %SAT Latest Ref Range: 21 - 57 %  4 (L)  Saturation Ratios Latest Ref Range: 10.4 - 31.8 % 3 (L)   Ferritin Latest Ref Range: 9 - 269 ng/ml 31 52  Folate Latest Ref Range: >5.9 ng/mL 15.1      RADIOGRAPHIC STUDIES: I have personally reviewed the radiological images as listed and agreed with the findings in the report. No results found.  ASSESSMENT & PLAN:  81 y.o. Caucasian female, with past medical history of hypertension, hypercholesterolemia, arthritis on chronic NSAIDs, presented with worsening anemia  1. Anemia of iron deficiency  -Her lab reviewed low serum iron, transferrin saturation, and borderline low ferritin, this is consistent with iron deficiency. Likely secondary to chronic ulcers from NSAIDS -However her degree of anemia seem to be out of portion of iron deficiency, she does have chronic renal failure, which likely also contributes to her anemia -Her reticulocyte count today has elevated, however she has normal LDH, normal total bilirubin, haptoglobin is pending, no lab evidence of hemolysis -She has responded to IV iron very well, her anemia has resolved now. -We'll monitor her CBC and iron study closely every 2 months - She should continue to take iron oral  2. HTN, arthritis  -Follow-up of his primary care physician, continue medication  3. CKD, stage IV -Follow up with primary care physician, avoid nephrotoxic agents, such as NSAIDs and iv contrast   Plan -lab today Lab Iron study every 2 months F/u in 4 months  I spent a total of 20 minutes before her visit,  more than 50% on face-to-face counseling.  This  document serves as a record of services personally performed by Truitt Merle, MD. It was created on her behalf by Brandt Loosen, a trained medical scribe. The creation of this record is based on the scribe's personal observations and the provider's statements to them. This document has been checked and approved by the attending provider.     Truitt Merle, MD 05/09/2017

## 2017-05-09 ENCOUNTER — Other Ambulatory Visit (HOSPITAL_BASED_OUTPATIENT_CLINIC_OR_DEPARTMENT_OTHER): Payer: Medicare Other

## 2017-05-09 ENCOUNTER — Ambulatory Visit (HOSPITAL_BASED_OUTPATIENT_CLINIC_OR_DEPARTMENT_OTHER): Payer: Medicare Other | Admitting: Hematology

## 2017-05-09 VITALS — BP 184/40 | HR 82 | Temp 98.4°F | Resp 18 | Ht 65.0 in | Wt 149.6 lb

## 2017-05-09 DIAGNOSIS — D509 Iron deficiency anemia, unspecified: Secondary | ICD-10-CM | POA: Diagnosis not present

## 2017-05-09 DIAGNOSIS — N184 Chronic kidney disease, stage 4 (severe): Secondary | ICD-10-CM

## 2017-05-09 DIAGNOSIS — I10 Essential (primary) hypertension: Secondary | ICD-10-CM | POA: Diagnosis not present

## 2017-05-09 DIAGNOSIS — D500 Iron deficiency anemia secondary to blood loss (chronic): Secondary | ICD-10-CM

## 2017-05-09 DIAGNOSIS — D638 Anemia in other chronic diseases classified elsewhere: Secondary | ICD-10-CM

## 2017-05-09 LAB — IRON AND TIBC
%SAT: 32 % (ref 21–57)
Iron: 76 ug/dL (ref 41–142)
TIBC: 240 ug/dL (ref 236–444)
UIBC: 164 ug/dL (ref 120–384)

## 2017-05-09 LAB — CBC & DIFF AND RETIC
BASO%: 0.4 % (ref 0.0–2.0)
BASOS ABS: 0 10*3/uL (ref 0.0–0.1)
EOS ABS: 0.2 10*3/uL (ref 0.0–0.5)
EOS%: 2.6 % (ref 0.0–7.0)
HEMATOCRIT: 38.3 % (ref 34.8–46.6)
HEMOGLOBIN: 12.2 g/dL (ref 11.6–15.9)
IMMATURE RETIC FRACT: 1.4 % — AB (ref 1.60–10.00)
LYMPH%: 26.6 % (ref 14.0–49.7)
MCH: 28.7 pg (ref 25.1–34.0)
MCHC: 31.9 g/dL (ref 31.5–36.0)
MCV: 90.1 fL (ref 79.5–101.0)
MONO#: 0.7 10*3/uL (ref 0.1–0.9)
MONO%: 8.9 % (ref 0.0–14.0)
NEUT#: 5.1 10*3/uL (ref 1.5–6.5)
NEUT%: 61.5 % (ref 38.4–76.8)
NRBC: 0 % (ref 0–0)
Platelets: 200 10*3/uL (ref 145–400)
RBC: 4.25 10*6/uL (ref 3.70–5.45)
RDW: 16.6 % — AB (ref 11.2–14.5)
Retic %: 1.81 % (ref 0.70–2.10)
Retic Ct Abs: 76.93 10*3/uL (ref 33.70–90.70)
WBC: 8.2 10*3/uL (ref 3.9–10.3)
lymph#: 2.2 10*3/uL (ref 0.9–3.3)

## 2017-05-09 LAB — FERRITIN: FERRITIN: 582 ng/mL — AB (ref 9–269)

## 2017-05-10 ENCOUNTER — Encounter: Payer: Self-pay | Admitting: Hematology

## 2017-05-12 ENCOUNTER — Telehealth: Payer: Self-pay | Admitting: Hematology

## 2017-05-12 NOTE — Telephone Encounter (Signed)
Left message on patient's voicemail with appointment details,per 05/09/17 los. Appointments scheduled per 05/09/17 los. Patient was mailed a copy of the appointment schedule and appointment letter.

## 2017-05-18 ENCOUNTER — Ambulatory Visit (HOSPITAL_COMMUNITY)
Admission: RE | Admit: 2017-05-18 | Discharge: 2017-05-18 | Disposition: A | Discharge status: Home / Self Care | Payer: Medicare Other | Source: Ambulatory Visit | Attending: Vascular Surgery | Admitting: Vascular Surgery

## 2017-05-18 ENCOUNTER — Encounter: Payer: Self-pay | Admitting: Family

## 2017-05-18 ENCOUNTER — Ambulatory Visit (INDEPENDENT_AMBULATORY_CARE_PROVIDER_SITE_OTHER): Payer: Medicare Other | Admitting: Family

## 2017-05-18 VITALS — BP 167/82 | HR 74 | Temp 97.8°F | Resp 18 | Ht 65.0 in | Wt 150.0 lb

## 2017-05-18 DIAGNOSIS — I771 Stricture of artery: Secondary | ICD-10-CM

## 2017-05-18 DIAGNOSIS — I6523 Occlusion and stenosis of bilateral carotid arteries: Secondary | ICD-10-CM | POA: Diagnosis not present

## 2017-05-18 DIAGNOSIS — I6529 Occlusion and stenosis of unspecified carotid artery: Secondary | ICD-10-CM | POA: Insufficient documentation

## 2017-05-18 LAB — VAS US CAROTID
LCCAPSYS: 103 cm/s
LEFT ECA DIAS: -15 cm/s
LICADDIAS: -18 cm/s
LICAPSYS: -80 cm/s
Left CCA dist dias: 16 cm/s
Left CCA dist sys: 101 cm/s
Left CCA prox dias: 15 cm/s
Left ICA dist sys: -74 cm/s
Left ICA prox dias: -15 cm/s
RCCAPDIAS: 6 cm/s
RIGHT CCA MID DIAS: 13 cm/s
RIGHT ECA DIAS: -8 cm/s
RIGHT VERTEBRAL DIAS: 8 cm/s
Right CCA prox sys: 53 cm/s
Right cca dist sys: 66 cm/s

## 2017-05-18 NOTE — Progress Notes (Signed)
Chief Complaint: Follow up Extracranial Carotid Artery Stenosis and Left Subclavian Artery Stenosis    History of Present Illness  Amy [name] is a 81 y.o. 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   She has a chronic history of her left upper extremity blood pressure being lower than her right arm blood pressure. She has noticed this when she monitors her blood pressure at home. She began have pain in her right shoulder and was curious whether or not this had anything to do with her blood pressure discrepancy.  Dr. Oneida Alar reviewed her records from 2011. She had a 20 mm gradient between her left brachial to right brachial pressure at that time. She occasionally develops  dizziness if her head is tilted back and she is working above her head. She actually has her children change light bulbs in-house because it makes her dizzy. She has  mild exertional fatigue in the left lower extremity. On the right side she complains primarily of pain in the upper arm and shoulder area.  She currently denies any symptoms of TIA amaurosis or stroke.  Other medical problems include hypertension and elevated cholesterol both of which are currently stable. She is a former smoker, quit in 2009.  She is on daily aspirin but not a statin.  She had a left carotid endarterectomy in 2011 by Dr. Oneida Alar. This was for symptomatic carotid stenosis.  Dr. Oneida Alar last evaluated Amy [name] on 11-10-16. At that time upper extremity arterial duplex showed triphasic waveforms on the right, monophasic on the left, suggestive of left subclavian artery stenosis.  The patient also had an MRA of the neck performed December 2017.  Innominate stenosis was estimated at 30-50%. Left common carotid stenosis was estimated at 50%. Left subclavian stenosis is estimated at 70% or greater.  There was no antegrade flow in the left vertebral artery suggestive of reversal of flow  secondary to the subclavian stenosis Patient's blood pressure discrepancy can be explained by her left subclavian stenosis. This has probably been present for several years as she had a blood pressure discrepancy as far back as 2011. She currently probably has some symptoms from this with left arm exertional fatigue and some occasional dizziness when working with her left arm above her head. Dr. Oneida Alar did not believe there was any vascular etiology for her right shoulder pain which was her primary complaint in the office that day. Dr. Oneida Alar discussed with her the possibility of an arteriogram for possible angioplasty or stenting of her left subclavian stenosis or consideration of left carotid to subclavian bypass for improvement of her dizziness and left arm exertional fatigue symptoms. At that time Amy [name] did not seem interested in this. Dr. Oneida Alar indicated that he believes her overall stroke risk is low. Of note her left carotid endarterectomy site was patent and she did have a moderate carotid stenosis on the right side. Dr. Oneida Alar discussed with the patient that she should always have the blood pressure measured in her right arm as this is going to be more accurate than the left arm which is going to be artificially low.  If she wishes to have an intervention on her left subclavian artery she can call Dr. Oneida Alar at any time and we can consider this. Otherwise she needs a repeat carotid duplex exam for surveillance in 6 months. We'll leave at the consideration of her primary care physician whether or not to start a statin however  there is evidence that patients with peripheral arterial/carotid disease have increased lifespan if on a statin. Dr. Oneida Alar advised that Amy [name] continue her aspirin.  Daughter states that Amy [name] long term use of medications to help her severe generalized arthritis has caused a GI ulcer and bleeding; has had PRBC and iron transfusions; states her HCT was 6.5, increased to 11.1.  Amy [name]  states she has had 3 back surgeries, and daughter states that left leg stays numb due to back problems. She also states that her c-spine has some stenosis that Dr. Maxie Better told her is causing her left arm to be numb when she uses her left arm  She denies any subsequent stroke or TIA since the 2011 stroke prior to the left CEA in 2011.    Amy [name] Diabetic: no Amy [name] smoker: former smoker, quit in 2009, states she was a light smoker  Amy [name] meds include: Statin : no, was taking for a long time, was stopped due to thinking that it worsened her arthralgias.  ASA: yes Other anticoagulants/antiplatelets: no   Past Medical History:  Diagnosis Date  . Arthritis   . HTN (hypertension)   . Hypercholesterolemia     Social History Social History  Substance Use Topics  . Smoking status: Former Smoker    Packs/day: 0.25    Years: 20.00  . Smokeless tobacco: Never Used     Comment: Quite 2009  . Alcohol use No    Family History Family History  Problem Relation Age of Onset  . Diabetes Father   . Diabetes Other   . Cancer Maternal Uncle        unknown type cancer   . CAD Neg Hx   . Stroke Neg Hx     Surgical History Past Surgical History:  Procedure Laterality Date  . BACK SURGERY    . CARDIAC CATHETERIZATION  01-02-2009   The anterior descending artery is moderate  in size. There is an eccentric 60-70% proximal stenosis followed by 50-60% mid stenosis. EF 75-80%  . CARDIOVASCULAR STRESS TEST  12-26-2008   EF 65%  . CATARACT EXTRACTION    . COLONOSCOPY N/A 02/15/2017   Procedure: COLONOSCOPY;  Surgeon: Milus Banister, MD;  Location: [place];  Service: Endoscopy;  Laterality: N/A;  . CYSTECTOMY     Right Hand  . HEMORROIDECTOMY    . TONSILLECTOMY    . TOTAL ABDOMINAL HYSTERECTOMY      Allergies  Allergen Reactions  . Codeine Nausea And Vomiting    REACTION: nausea/vomiting  . Sulfonamide Derivatives Itching and Rash    Current Outpatient Prescriptions  Medication Sig Dispense  Refill  . amLODipine (NORVASC) 10 MG tablet Take 10 mg by mouth daily.    Marland Kitchen aspirin 81 MG tablet Take 81 mg by mouth daily.    Marland Kitchen atenolol (TENORMIN) 50 MG tablet Take 50 mg by mouth daily.    Marland Kitchen CALCIUM-VITAMIN D PO Take 1 tablet by mouth 2 (two) times daily.    . cholecalciferol (VITAMIN D) 400 units TABS tablet Take 400 Units by mouth daily.    . ferrous sulfate 325 (65 FE) MG tablet Take 325 mg by mouth 3 (three) times daily with meals.     . fluticasone (FLONASE) 50 MCG/ACT nasal spray Place 2 sprays into both nostrils at bedtime.    Marland Kitchen HYDROcodone-acetaminophen (NORCO/VICODIN) 5-325 MG tablet Take 1 tablet by mouth every 6 (six) hours as needed for moderate pain.     Marland Kitchen loratadine (CLARITIN) 10 MG tablet Take 10  mg by mouth daily.    . meclizine (ANTIVERT) 25 MG tablet Take 25 mg by mouth 3 (three) times daily as needed for dizziness.    . Multiple Vitamin (MULTIVITAMIN WITH MINERALS) TABS tablet Take 1 tablet by mouth daily.    . nitroGLYCERIN (NITROSTAT) 0.4 MG SL tablet Place 0.4 mg under the tongue every 5 (five) minutes as needed for chest pain.     . pantoprazole (PROTONIX) 40 MG tablet Take 1 tablet (40 mg total) by mouth daily at 6 (six) AM. 30 tablet 11  . telmisartan-hydrochlorothiazide (MICARDIS HCT) 80-25 MG tablet Take 1 tablet by mouth daily.    . vitamin B-12 (CYANOCOBALAMIN) 1000 MCG tablet Take 1,000 mcg by mouth daily.     . vitamin E 400 UNIT capsule Take 400 Units by mouth daily.     No current facility-administered medications for this visit.     Review of Systems : See HPI for pertinent positives and negatives.  Physical Examination  Vitals:   05/18/17 1054  BP: (!) 167/82  Pulse: 74  Resp: 18  Temp: 97.8 F (36.6 C)  TempSrc: Oral  SpO2: 97%  Weight: 150 lb (68 kg)  Height: 5\' 5"  (1.651 m)   Body mass index is 24.96 kg/m.  General: WDWN female in NAD GAIT: normal Eyes: PERRLA Pulmonary:  Respirations are non-labored, good air movement, CTAB, no  rales, rhonchi, or wheezing.  Cardiac: regular rhythm and rate, no detected murmur.  VASCULAR EXAM Carotid Bruits Right Left   Positive Positive    Aorta is not palpable. Radial pulses: 2+ right, 1+ left palpable                                                                                                                         LE Pulses Right Left       POPLITEAL  not palpable   not palpable       POSTERIOR TIBIAL  not palpable   not palpable        DORSALIS PEDIS      ANTERIOR TIBIAL faintly palpable  not palpable     Gastrointestinal: soft, nontender, BS WNL, no r/g, not palpable masses.  Musculoskeletal: No muscle atrophy/wasting. M/S 5/5 in upper extremities, 3/5 in lower extremties, extremities without ischemic changes.  Neurologic:  A&O X 3; appropriate affect, sensation is normal; speech is normal, CN 2-12 intact, pain and light touch intact in extremities, motor exam as listed above.    Assessment: CASSANDA WALMER is a 81 y.o. female who presents with non life limiting mildly symptomatic left subclavian artery stenosis, although her left brachial pressure is 80 compared to right of 167. She has normal sensation and motor function in both hands at this time. She states her spine surgeon told her that the numbness she has at times in her left hand and arm is likely due to c-spine stenosis. Her left radial pulse is 1+ palpable, right is 2+ palpable.  She is  also s/p left carotid endarterectomy in 2011 for symptomatic carotid stenosis. She has had no subsequent stroke or TIA.   Unable to obtain brachial pressure in left arm with automated cuff or manual cuff using stethoscope. But with use of manual cuff and Doppler her left systolic brachial pressure is 80 mm Hg. Use of automated cuff, right brachial pressure is 167/82, significant difference in blood pressures.   I discussed with Dr. Oneida Alar whether to obtain bilateral upper extremity arterial duplex on her return; will  only obtain if the Amy [name] becomes more than mildly symptomatic in her left arm.   DATA Carotid Duplex (05/18/17): Technically difficult study due to patient body habitus and respiratory patterns.  Left vertebral artery no visualized. Right vertebral artery flow is antegrade. Bilateral subclavian artery waveforms are normal.  Bilateral ICA with 1-39% stenosis. No significant change compared to the last exam on 12-07-11.    Plan: Follow-up in 1 year with Carotid Duplex scan.   I discussed in depth with the patient the nature of atherosclerosis, and emphasized the importance of maximal medical management including strict control of blood pressure, blood glucose, and lipid levels, obtaining regular exercise, and continued cessation of smoking.  The patient is aware that without maximal medical management the underlying atherosclerotic disease process will progress, limiting the benefit of any interventions. The patient was given information about stroke prevention and what symptoms should prompt the patient to seek immediate medical care. Thank you for allowing [place] to participate in this patient's care.  Clemon Chambers, RN, MSN, FNP-C [facility] Office: [place]: [place]  05/18/17 11:40 AM

## 2017-05-18 NOTE — Patient Instructions (Signed)
Stroke Prevention Some medical conditions and behaviors are associated with an increased chance of having a stroke. You may prevent a stroke by making healthy choices and managing medical conditions. How can I reduce my risk of having a stroke?  Stay physically active. Get at least 30 minutes of activity on most or all days.  Do not smoke. It may also be helpful to avoid exposure to secondhand smoke.  Limit alcohol use. Moderate alcohol use is considered to be: ? No more than 2 drinks per day for men. ? No more than 1 drink per day for nonpregnant women.  Eat healthy foods. This involves: ? Eating 5 or more servings of fruits and vegetables a day. ? Making dietary changes that address high blood pressure (hypertension), high cholesterol, diabetes, or obesity.  Manage your cholesterol levels. ? Making food choices that are high in fiber and low in saturated fat, trans fat, and cholesterol may control cholesterol levels. ? Take any prescribed medicines to control cholesterol as directed by your health care provider.  Manage your diabetes. ? Controlling your carbohydrate and sugar intake is recommended to manage diabetes. ? Take any prescribed medicines to control diabetes as directed by your health care provider.  Control your hypertension. ? Making food choices that are low in salt (sodium), saturated fat, trans fat, and cholesterol is recommended to manage hypertension. ? Ask your health care provider if you need treatment to lower your blood pressure. Take any prescribed medicines to control hypertension as directed by your health care provider. ? If you are 18-39 years of age, have your blood pressure checked every 3-5 years. If you are 40 years of age or older, have your blood pressure checked every year.  Maintain a healthy weight. ? Reducing calorie intake and making food choices that are low in sodium, saturated fat, trans fat, and cholesterol are recommended to manage  weight.  Stop drug abuse.  Avoid taking birth control pills. ? Talk to your health care provider about the risks of taking birth control pills if you are over 35 years old, smoke, get migraines, or have ever had a blood clot.  Get evaluated for sleep disorders (sleep apnea). ? Talk to your health care provider about getting a sleep evaluation if you snore a lot or have excessive sleepiness.  Take medicines only as directed by your health care provider. ? For some people, aspirin or blood thinners (anticoagulants) are helpful in reducing the risk of forming abnormal blood clots that can lead to stroke. If you have the irregular heart rhythm of atrial fibrillation, you should be on a blood thinner unless there is a good reason you cannot take them. ? Understand all your medicine instructions.  Make sure that other conditions (such as anemia or atherosclerosis) are addressed. Get help right away if:  You have sudden weakness or numbness of the face, arm, or leg, especially on one side of the body.  Your face or eyelid droops to one side.  You have sudden confusion.  You have trouble speaking (aphasia) or understanding.  You have sudden trouble seeing in one or both eyes.  You have sudden trouble walking.  You have dizziness.  You have a loss of balance or coordination.  You have a sudden, severe headache with no known cause.  You have new chest pain or an irregular heartbeat. Any of these symptoms may represent a serious problem that is an emergency. Do not wait to see if the symptoms will go away.   Get medical help at once. Call your local emergency services (911 in [place] Do not drive yourself to the hospital. This information is not intended to replace advice given to you by your health care provider. Make sure you discuss any questions you have with your health care provider. Document Released: 12/01/2004 Document Revised: 03/31/2016 Document Reviewed: 04/26/2013 Elsevier  Interactive Patient Education  2017 Elsevier Inc.     Preventing Cerebrovascular Disease Arteries are blood vessels that carry blood that contains oxygen from the heart to all parts of the body. Cerebrovascular disease affects arteries that supply the brain. Any condition that blocks or disrupts blood flow to the brain can cause cerebrovascular disease. Brain cells that lose blood supply start to die within minutes (stroke). Stroke is the main danger of cerebrovascular disease. Atherosclerosis and high blood pressure are common causes of cerebrovascular disease. Atherosclerosis is narrowing and hardening of an artery that results when fat, cholesterol, calcium, or other substances (plaque) build up inside an artery. Plaque reduces blood flow through the artery. High blood pressure increases the risk of bleeding inside the brain. Making diet and lifestyle changes to prevent atherosclerosis and high blood pressure lowers your risk of cerebrovascular disease. What nutrition changes can be made?  Eat more fruits, vegetables, and whole grains.  Reduce how much saturated fat you eat. To do this, eat less red meat and fewer full-fat dairy products.  Eat healthy proteins instead of red meat. Healthy proteins include: ? Fish. Eat fish that contains heart-healthy omega-3 fatty acids, twice a week. Examples include salmon, albacore tuna, mackerel, and herring. ? Chicken. ? Nuts. ? Low-fat or nonfat yogurt.  Avoid processed meats, like bacon and lunchmeat.  Avoid foods that contain: ? A lot of sugar, such as sweets and drinks with added sugar. ? A lot of salt (sodium). Avoid adding extra salt to your food, as told by your health care provider. ? Trans fats, such as margarine and baked goods. Trans fats may be listed as "partially hydrogenated oils" on food labels.  Check food labels to see how much sodium, sugar, and trans fats are in foods.  Use vegetable oils that contain low amounts of  saturated fat, such as olive oil or canola oil. What lifestyle changes can be made?  Drink alcohol in moderation. This means no more than 1 drink a day for nonpregnant women and 2 drinks a day for men. One drink equals 12 oz of beer, 5 oz of wine, or 1 oz of hard liquor.  If you are overweight, ask your health care provider to recommend a weight-loss plan for you. Losing 5-10 lb (2.2-4.5 kg) can reduce your risk of diabetes, atherosclerosis, and high blood pressure.  Exercise for 30?60 minutes on most days, or as much as told by your health care provider. ? Do moderate-intensity exercise, such as brisk walking, bicycling, and water aerobics. Ask your health care provider which activities are safe for you.  Do not use any products that contain nicotine or tobacco, such as cigarettes and e-cigarettes. If you need help quitting, ask your health care provider. Why are these changes important? Making these changes lowers your risk of many diseases that can cause cerebrovascular disease and stroke. Stroke is a leading cause of death and disability. Making these changes also improves your overall health and quality of life. What can I do to lower my risk? The following factors make you more likely to develop cerebrovascular disease:  Being overweight.  Smoking.  Being physically inactive.    Eating a high-fat diet.  Having certain health conditions, such as: ? Diabetes. ? High blood pressure. ? Heart disease. ? Atherosclerosis. ? High cholesterol. ? Sickle cell disease.  Talk with your health care provider about your risk for cerebrovascular disease. Work with your health care provider to control diseases that you have that may contribute to cerebrovascular disease. Your health care provider may prescribe medicines to help prevent major causes of cerebrovascular disease. Where to find more information: Learn more about preventing cerebrovascular disease from:  National Heart, Lung, and  Blood Institute: www.nhlbi.nih.gov/health/health-topics/topics/stroke  Centers for Disease Control and Prevention: cdc.gov/stroke/about.htm  Summary  Cerebrovascular disease can lead to a stroke.  Atherosclerosis and high blood pressure are major causes of cerebrovascular disease.  Making diet and lifestyle changes can reduce your risk of cerebrovascular disease.  Work with your health care provider to get your risk factors under control to reduce your risk of cerebrovascular disease. This information is not intended to replace advice given to you by your health care provider. Make sure you discuss any questions you have with your health care provider. Document Released: 11/08/2015 Document Revised: 05/13/2016 Document Reviewed: 11/08/2015 Elsevier Interactive Patient Education  2018 Elsevier Inc.  

## 2017-05-22 NOTE — Addendum Note (Signed)
Addended by: Lianne Cure A on: 05/22/2017 03:33 PM   Modules accepted: Orders

## 2017-05-23 ENCOUNTER — Other Ambulatory Visit: Payer: Medicare Other

## 2017-05-30 ENCOUNTER — Telehealth: Payer: Self-pay | Admitting: Hematology

## 2017-05-30 NOTE — Telephone Encounter (Signed)
R/s appts per 7/23 sch message - patient aware - reminder letter sent .

## 2017-06-06 ENCOUNTER — Other Ambulatory Visit: Payer: Medicare Other

## 2017-06-06 ENCOUNTER — Ambulatory Visit: Payer: Medicare Other | Admitting: Hematology

## 2017-07-18 ENCOUNTER — Other Ambulatory Visit (HOSPITAL_BASED_OUTPATIENT_CLINIC_OR_DEPARTMENT_OTHER): Payer: Medicare Other

## 2017-07-18 DIAGNOSIS — N184 Chronic kidney disease, stage 4 (severe): Secondary | ICD-10-CM

## 2017-07-18 DIAGNOSIS — D509 Iron deficiency anemia, unspecified: Secondary | ICD-10-CM

## 2017-07-18 DIAGNOSIS — D500 Iron deficiency anemia secondary to blood loss (chronic): Secondary | ICD-10-CM

## 2017-07-18 LAB — CBC & DIFF AND RETIC
BASO%: 0.3 % (ref 0.0–2.0)
Basophils Absolute: 0 10*3/uL (ref 0.0–0.1)
EOS ABS: 0.1 10*3/uL (ref 0.0–0.5)
EOS%: 1.6 % (ref 0.0–7.0)
HCT: 40.8 % (ref 34.8–46.6)
HEMOGLOBIN: 13.2 g/dL (ref 11.6–15.9)
IMMATURE RETIC FRACT: 2.3 % (ref 1.60–10.00)
LYMPH%: 19 % (ref 14.0–49.7)
MCH: 29.7 pg (ref 25.1–34.0)
MCHC: 32.4 g/dL (ref 31.5–36.0)
MCV: 91.7 fL (ref 79.5–101.0)
MONO#: 0.7 10*3/uL (ref 0.1–0.9)
MONO%: 8.2 % (ref 0.0–14.0)
NEUT%: 70.9 % (ref 38.4–76.8)
NEUTROS ABS: 5.6 10*3/uL (ref 1.5–6.5)
Platelets: 201 10*3/uL (ref 145–400)
RBC: 4.45 10*6/uL (ref 3.70–5.45)
RDW: 14.8 % — ABNORMAL HIGH (ref 11.2–14.5)
Retic %: 1.69 % (ref 0.70–2.10)
Retic Ct Abs: 75.21 10*3/uL (ref 33.70–90.70)
WBC: 7.9 10*3/uL (ref 3.9–10.3)
lymph#: 1.5 10*3/uL (ref 0.9–3.3)

## 2017-07-18 LAB — IRON AND TIBC
%SAT: 46 % (ref 21–57)
Iron: 112 ug/dL (ref 41–142)
TIBC: 246 ug/dL (ref 236–444)
UIBC: 134 ug/dL (ref 120–384)

## 2017-07-18 LAB — FERRITIN: Ferritin: 338 ng/ml — ABNORMAL HIGH (ref 9–269)

## 2017-07-19 ENCOUNTER — Telehealth: Payer: Self-pay | Admitting: *Deleted

## 2017-07-19 NOTE — Telephone Encounter (Signed)
Pt informed of lab result showing no iron deficiency per Dr Burr Medico.

## 2017-07-19 NOTE — Telephone Encounter (Signed)
duplicate

## 2017-09-01 NOTE — Progress Notes (Signed)
[name]  [phone] Fax:(336) [phone]  Clinic Follow UpNote   Patient Care Team: Burnard Bunting, MD as PCP - General (Internal Medicine) Truitt Merle, MD as Consulting Physician (Hematology) Irene Shipper, MD as Consulting Physician (Gastroenterology) 09/04/2017  CHIEF COMPLAINTS:  Follow up Anemia   HISTORY OF PRESENTING ILLNESS (04/11/2017):  Amy [name] 81 y.o. female is here because of her anemia. She was referred by her gastroenterologist Dr. Henrene Pastor. She presents to my clinic with her daughter today.  She was found to have anemia since 5-6 months ago, she has developed was are fatigue in the past few months, she presented to Hospital on 02/13/2017 for worsening fatigue. CBC showed anemia with hemoglobin 6.4, MCV 81.3, WBC and platelet count was slightly elevated. Our study showed low iron and saturation, she was given 2 units of blood transfusion. She underwent colonoscopy in the hospital, which showed multiple ulcers in the colon, felt to be related to her NSAIDs which she took for arthritis. She started ferrous sulfate 1 tablet daily after hospital discharge. She followed with gastroenterologist Dr. Henrene Pastor in the office last week, repeat the CBC showed hemoglobin 7.1, she was referred to [place] for urgent consult for her anemia. Dr. Henrene Pastor increased her iron pill to 3 tablets a day.  She denies recent chest pain on exertion, she has mild shortness of breath on moderate to heavy exertion, no pre-syncopal episodes, or palpitations. She had not noticed any recent bleeding such as epistaxis, hematuria or hematochezia She used to take multiple NSAIDs for her arthritis, which has been held since early April. She is still on baby aspirin daily. She had no prior history or diagnosis of cancer. Her age appropriate screening programs are up-to-date. She denies any pica and eats a variety of diet. She never donated blood or received blood transfusion  She denies  recent weight loss, no fever, chills, night sweats, or skin itchiness.   CURRENT THERAPY: Ferrous sulfate 1 tablet daily, IV Feraheme as needed  INTERVAL HISTORY:  Amy [name] is here for a follow up. She presents to the clinic today by herself.  She is doing very well, tolerating routine activities without any difficulties.  She has mild fatigue, but her energy level is much improved since she received IV iron.  She denies dyspnea, chest pain, or other discomfort.  She is tolerating iron tablets well, she is on once a day.   MEDICAL HISTORY:  Past Medical History:  Diagnosis Date  . Arthritis   . HTN (hypertension)   . Hypercholesterolemia     SURGICAL HISTORY: Past Surgical History:  Procedure Laterality Date  . BACK SURGERY    . CARDIAC CATHETERIZATION  01-02-2009   The anterior descending artery is moderate  in size. There is an eccentric 60-70% proximal stenosis followed by 50-60% mid stenosis. EF 75-80%  . CARDIOVASCULAR STRESS TEST  12-26-2008   EF 65%  . CATARACT EXTRACTION    . COLONOSCOPY N/A 02/15/2017   Procedure: COLONOSCOPY;  Surgeon: Milus Banister, MD;  Location: [facility];  Service: Endoscopy;  Laterality: N/A;  . CYSTECTOMY     Right Hand  . HEMORROIDECTOMY    . TONSILLECTOMY    . TOTAL ABDOMINAL HYSTERECTOMY      SOCIAL HISTORY: Social History   Social History  . Marital status: Widowed    Spouse name: N/A  . Number of children: N/A  . Years of education: N/A   Occupational History  . Not on file.  Social History Main Topics  . Smoking status: Former Smoker    Packs/day: 0.25    Years: 20.00  . Smokeless tobacco: Never Used     Comment: Quite 2009  . Alcohol use No  . Drug use: No  . Sexual activity: Not on file   Other Topics Concern  . Not on file   Social History Narrative  . No narrative on file    FAMILY HISTORY: Family History  Problem Relation Age of Onset  . Diabetes Father   . Diabetes Other   . Cancer Maternal Uncle         unknown type cancer   . CAD Neg Hx   . Stroke Neg Hx     ALLERGIES:  is allergic to codeine and sulfonamide derivatives.  MEDICATIONS:  Current Outpatient Prescriptions  Medication Sig Dispense Refill  . amLODipine (NORVASC) 10 MG tablet Take 10 mg by mouth daily.    Marland Kitchen aspirin 81 MG tablet Take 81 mg by mouth daily.    Marland Kitchen atenolol (TENORMIN) 50 MG tablet Take 50 mg by mouth daily.    Marland Kitchen CALCIUM-VITAMIN D PO Take 1 tablet by mouth 2 (two) times daily.    . cholecalciferol (VITAMIN D) 400 units TABS tablet Take 400 Units by mouth daily.    . cyclobenzaprine (FLEXERIL) 10 MG tablet     . ferrous sulfate 325 (65 FE) MG tablet Take 325 mg by mouth 3 (three) times daily with meals.     . fluticasone (FLONASE) 50 MCG/ACT nasal spray Place 2 sprays into both nostrils at bedtime.    Marland Kitchen HYDROcodone-acetaminophen (NORCO/VICODIN) 5-325 MG tablet Take 1 tablet by mouth every 6 (six) hours as needed for moderate pain.     Marland Kitchen loratadine (CLARITIN) 10 MG tablet Take 10 mg by mouth daily.    . meclizine (ANTIVERT) 25 MG tablet Take 25 mg by mouth 3 (three) times daily as needed for dizziness.    . Multiple Vitamin (MULTIVITAMIN WITH MINERALS) TABS tablet Take 1 tablet by mouth daily.    . pantoprazole (PROTONIX) 40 MG tablet Take 1 tablet (40 mg total) by mouth daily at 6 (six) AM. 30 tablet 11  . telmisartan-hydrochlorothiazide (MICARDIS HCT) 80-25 MG tablet Take 1 tablet by mouth daily.    . vitamin B-12 (CYANOCOBALAMIN) 1000 MCG tablet Take 1,000 mcg by mouth daily.     . vitamin E 400 UNIT capsule Take 400 Units by mouth daily.    . nitroGLYCERIN (NITROSTAT) 0.4 MG SL tablet Place 0.4 mg under the tongue every 5 (five) minutes as needed for chest pain.      No current facility-administered medications for this visit.     REVIEW OF SYSTEMS:   Constitutional: Denies fevers, chills or abnormal night sweats, (+)improved fatigue  Eyes: Denies blurriness of vision, double vision or watery  eyes Ears, nose, mouth, throat, and face: Denies mucositis or sore throat Respiratory: Denies cough, dyspnea or wheezes Cardiovascular: Denies palpitation, chest discomfort or lower extremity swelling Gastrointestinal:  Denies nausea, heartburn or change in bowel habits Skin: Denies abnormal skin rashes Lymphatics: Denies new lymphadenopathy or easy bruising Neurological:Denies numbness, tingling or new weaknesses Behavioral/Psych: Mood is stable, no new changes  MSK: (+)lower back pain All other systems were reviewed with the patient and are negative.  PHYSICAL EXAMINATION:  Vitals:   09/04/17 1126  BP: (!) 174/46  Pulse: 78  Resp: 18  Temp: 98.2 F (36.8 C)  SpO2: 100%   Filed Weights  09/04/17 1126  Weight: 149 lb 9.6 oz (67.9 kg)    GENERAL:alert, no distress and comfortable, appears to be pale  SKIN: skin color, texture, turgor are normal, no rashes or significant lesions EYES: normal, conjunctiva are pink and non-injected, sclera clear OROPHARYNX:no exudate, no erythema and lips, buccal mucosa, and tongue normal  NECK: supple, thyroid normal size, non-tender, without nodularity LYMPH:  no palpable lymphadenopathy in the cervical, axillary or inguinal LUNGS: clear to auscultation and percussion with normal breathing effort HEART: regular rate & rhythm and no murmurs and no lower extremity edema ABDOMEN:abdomen soft, non-tender and normal bowel sounds Musculoskeletal:no cyanosis of digits and no clubbing  PSYCH: alert & oriented x 3 with fluent speech NEURO: no focal motor/sensory deficits  LABORATORY DATA:  I have reviewed the data as listed CBC Latest Ref Rng & Units 09/04/2017 07/18/2017 05/09/2017  WBC 3.9 - 10.3 10e3/uL 6.8 7.9 8.2  Hemoglobin 11.6 - 15.9 g/dL 12.7 13.2 12.2  Hematocrit 34.8 - 46.6 % 38.8 40.8 38.3  Platelets 145 - 400 10e3/uL 184 201 200    CMP Latest Ref Rng & Units 04/11/2017 04/11/2017 02/15/2017  Glucose 70 - 140 mg/dl 105 - 89  BUN 7.0 -  26.0 mg/dL 33.9(H) - 42(H)  Creatinine 0.6 - 1.1 mg/dL 1.9(H) - 1.49(H)  Sodium 136 - 145 mEq/L 136 - 135  Potassium 3.5 - 5.1 mEq/L 4.8 - 4.2  Chloride 101 - 111 mmol/L - - 101  CO2 22 - 29 mEq/L 22 - 23  Calcium 8.4 - 10.4 mg/dL 9.2 - 9.0  Total Protein 6.0 - 8.5 g/dL 7.1 6.5 -  Total Bilirubin 0.20 - 1.20 mg/dL 0.22 - -  Alkaline Phos 40 - 150 U/L 107 - -  AST 5 - 34 U/L 16 - -  ALT 0 - 55 U/L 16 - -    Results for ALANYA, VUKELICH (MRN [id number]) as of 09/05/2017 11:55  Ref. Range 05/09/2017 15:04 07/18/2017 09:11 09/04/2017 10:52  Iron Latest Ref Range: 41 - 142 ug/dL 76 112 55  UIBC Latest Ref Range: 120 - 384 ug/dL 164 134 159  TIBC Latest Ref Range: 236 - 444 ug/dL 240 246 214 (L)  %SAT Latest Ref Range: 21 - 57 % 32 46 26  Ferritin Latest Ref Range: 9 - 269 ng/ml 582 (H) 338 (H) 421 (H)    RADIOGRAPHIC STUDIES: I have personally reviewed the radiological images as listed and agreed with the findings in the report. No results found.  ASSESSMENT & PLAN:  81 y.o. Caucasian female, with past medical history of hypertension, hypercholesterolemia, arthritis on chronic NSAIDs, presented with worsening anemia  1. Anemia of iron deficiency  -Her initial lab reviewed low serum iron, transferrin saturation, and borderline low ferritin, this is consistent with iron deficiency. Likely secondary to chronic ulcers from NSAIDS -However her degree of anemia seem to be out of portion of iron deficiency, she does have chronic renal failure, which likely also contributes to her anemia -She has responded to IV iron very well, her anemia has resolved now. -She is clinically doing very well, never reviewed, no anemia, her level is adequate -Continue oral ferrous sulfate twice daily.  I will consider IV Feraheme if she has low ferrtin or low serum iron level. -We'll monitor her CBC and iron study closely every 3 months   2. HTN, arthritis  -Follow-up of his primary care physician, continue  medication  3. CKD, stage IV -Follow up with primary care physician, avoid nephrotoxic  agents, such as NSAIDs and iv contrast   Plan -Lab results reviewed -She will continue oral iron -Repeat labs in 3 months, lab and follow-up in 6 months.  I spent a total of 20 minutes before her visit, more than 50% on face-to-face counseling.    Truitt Merle, MD 09/04/2017

## 2017-09-04 ENCOUNTER — Other Ambulatory Visit (HOSPITAL_BASED_OUTPATIENT_CLINIC_OR_DEPARTMENT_OTHER): Payer: Medicare Other

## 2017-09-04 ENCOUNTER — Ambulatory Visit (HOSPITAL_BASED_OUTPATIENT_CLINIC_OR_DEPARTMENT_OTHER): Payer: Medicare Other | Admitting: Hematology

## 2017-09-04 ENCOUNTER — Telehealth: Payer: Self-pay | Admitting: Hematology

## 2017-09-04 VITALS — BP 174/46 | HR 78 | Temp 98.2°F | Resp 18 | Ht 65.0 in | Wt 149.6 lb

## 2017-09-04 DIAGNOSIS — I10 Essential (primary) hypertension: Secondary | ICD-10-CM | POA: Diagnosis not present

## 2017-09-04 DIAGNOSIS — N184 Chronic kidney disease, stage 4 (severe): Secondary | ICD-10-CM | POA: Diagnosis not present

## 2017-09-04 DIAGNOSIS — D509 Iron deficiency anemia, unspecified: Secondary | ICD-10-CM

## 2017-09-04 DIAGNOSIS — D500 Iron deficiency anemia secondary to blood loss (chronic): Secondary | ICD-10-CM

## 2017-09-04 DIAGNOSIS — D638 Anemia in other chronic diseases classified elsewhere: Secondary | ICD-10-CM

## 2017-09-04 LAB — CBC & DIFF AND RETIC
BASO%: 0.3 % (ref 0.0–2.0)
BASOS ABS: 0 10*3/uL (ref 0.0–0.1)
EOS%: 1.6 % (ref 0.0–7.0)
Eosinophils Absolute: 0.1 10*3/uL (ref 0.0–0.5)
HEMATOCRIT: 38.8 % (ref 34.8–46.6)
HGB: 12.7 g/dL (ref 11.6–15.9)
Immature Retic Fract: 3.8 % (ref 1.60–10.00)
LYMPH%: 22.4 % (ref 14.0–49.7)
MCH: 31.4 pg (ref 25.1–34.0)
MCHC: 32.7 g/dL (ref 31.5–36.0)
MCV: 95.8 fL (ref 79.5–101.0)
MONO#: 1.3 10*3/uL — ABNORMAL HIGH (ref 0.1–0.9)
MONO%: 18.4 % — ABNORMAL HIGH (ref 0.0–14.0)
NEUT#: 3.9 10*3/uL (ref 1.5–6.5)
NEUT%: 57.3 % (ref 38.4–76.8)
PLATELETS: 184 10*3/uL (ref 145–400)
RBC: 4.05 10*6/uL (ref 3.70–5.45)
RDW: 13.2 % (ref 11.2–14.5)
Retic %: 2.45 % — ABNORMAL HIGH (ref 0.70–2.10)
Retic Ct Abs: 99.23 10*3/uL — ABNORMAL HIGH (ref 33.70–90.70)
WBC: 6.8 10*3/uL (ref 3.9–10.3)
lymph#: 1.5 10*3/uL (ref 0.9–3.3)

## 2017-09-04 LAB — IRON AND TIBC
%SAT: 26 % (ref 21–57)
Iron: 55 ug/dL (ref 41–142)
TIBC: 214 ug/dL — AB (ref 236–444)
UIBC: 159 ug/dL (ref 120–384)

## 2017-09-04 LAB — FERRITIN: Ferritin: 421 ng/ml — ABNORMAL HIGH (ref 9–269)

## 2017-09-04 NOTE — Telephone Encounter (Signed)
Gave patient avs report and appointments for January thru July

## 2017-09-05 ENCOUNTER — Telehealth: Payer: Self-pay | Admitting: *Deleted

## 2017-09-05 ENCOUNTER — Encounter: Payer: Self-pay | Admitting: Hematology

## 2017-09-05 NOTE — Telephone Encounter (Signed)
Notified pt of iron levels, she voiced appreciation for call. Next appt confirmed.

## 2017-09-05 NOTE — Telephone Encounter (Signed)
-----   Message from Truitt Merle, MD sent at 09/05/2017  2:48 PM EDT ----- Please let pt know that her iron level was good yesterday, thanks   Truitt Merle  09/05/2017

## 2017-12-04 ENCOUNTER — Inpatient Hospital Stay: Payer: Medicare Other | Attending: Hematology

## 2017-12-04 DIAGNOSIS — D500 Iron deficiency anemia secondary to blood loss (chronic): Secondary | ICD-10-CM

## 2017-12-04 DIAGNOSIS — N184 Chronic kidney disease, stage 4 (severe): Secondary | ICD-10-CM | POA: Diagnosis present

## 2017-12-04 DIAGNOSIS — D509 Iron deficiency anemia, unspecified: Secondary | ICD-10-CM | POA: Diagnosis present

## 2017-12-04 LAB — RETICULOCYTES
RBC.: 4.4 MIL/uL (ref 3.70–5.45)
RETIC CT PCT: 2.4 % — AB (ref 0.7–2.1)
Retic Count, Absolute: 105.6 10*3/uL — ABNORMAL HIGH (ref 33.7–90.7)

## 2017-12-04 LAB — CBC WITH DIFFERENTIAL (CANCER CENTER ONLY)
BASOS PCT: 1 %
Basophils Absolute: 0 10*3/uL (ref 0.0–0.1)
EOS ABS: 0.1 10*3/uL (ref 0.0–0.5)
EOS PCT: 2 %
HCT: 41.1 % (ref 34.8–46.6)
HEMOGLOBIN: 13.5 g/dL (ref 11.6–15.9)
LYMPHS ABS: 2 10*3/uL (ref 0.9–3.3)
Lymphocytes Relative: 30 %
MCH: 30.7 pg (ref 25.1–34.0)
MCHC: 32.8 g/dL (ref 31.5–36.0)
MCV: 93.4 fL (ref 79.5–101.0)
Monocytes Absolute: 0.6 10*3/uL (ref 0.1–0.9)
Monocytes Relative: 9 %
NEUTROS PCT: 58 %
Neutro Abs: 3.9 10*3/uL (ref 1.5–6.5)
PLATELETS: 213 10*3/uL (ref 145–400)
RBC: 4.4 MIL/uL (ref 3.70–5.45)
RDW: 12.7 % (ref 11.2–16.1)
WBC Count: 6.6 10*3/uL (ref 3.9–10.3)

## 2017-12-05 LAB — IRON AND TIBC
Iron: 84 ug/dL (ref 41–142)
Saturation Ratios: 35 % (ref 21–57)
TIBC: 240 ug/dL (ref 236–444)
UIBC: 156 ug/dL

## 2017-12-05 LAB — FERRITIN: Ferritin: 354 ng/mL — ABNORMAL HIGH (ref 9–269)

## 2017-12-07 ENCOUNTER — Telehealth: Payer: Self-pay | Admitting: *Deleted

## 2017-12-07 NOTE — Telephone Encounter (Signed)
TCT patient regarding recent lab results.  Spoke with patient and informed her that her labs are good, that her iron levels are good.  Patient voiced understanding.  No questions or concerns voiced.

## 2018-02-09 ENCOUNTER — Other Ambulatory Visit: Payer: Self-pay

## 2018-02-09 MED ORDER — PANTOPRAZOLE SODIUM 40 MG PO TBEC: 40.0000 mg | DELAYED_RELEASE_TABLET | Freq: Every day | ORAL | 6 refills | Status: DC

## 2018-03-05 ENCOUNTER — Inpatient Hospital Stay: Payer: Medicare Other

## 2018-03-05 ENCOUNTER — Telehealth: Payer: Self-pay | Admitting: Nurse Practitioner

## 2018-03-05 NOTE — Telephone Encounter (Signed)
Patient called to reschedule  °

## 2018-03-08 ENCOUNTER — Inpatient Hospital Stay: Payer: Medicare Other | Attending: Hematology

## 2018-03-08 DIAGNOSIS — D509 Iron deficiency anemia, unspecified: Secondary | ICD-10-CM | POA: Insufficient documentation

## 2018-03-08 DIAGNOSIS — D500 Iron deficiency anemia secondary to blood loss (chronic): Secondary | ICD-10-CM

## 2018-03-08 LAB — CBC WITH DIFFERENTIAL (CANCER CENTER ONLY)
BASOS ABS: 0 10*3/uL (ref 0.0–0.1)
Basophils Relative: 0 %
Eosinophils Absolute: 0.2 10*3/uL (ref 0.0–0.5)
Eosinophils Relative: 2 %
HEMATOCRIT: 44 % (ref 34.8–46.6)
Hemoglobin: 14.7 g/dL (ref 11.6–15.9)
LYMPHS ABS: 1.4 10*3/uL (ref 0.9–3.3)
LYMPHS PCT: 19 %
MCH: 31.1 pg (ref 25.1–34.0)
MCHC: 33.4 g/dL (ref 31.5–36.0)
MCV: 93 fL (ref 79.5–101.0)
Monocytes Absolute: 0.6 10*3/uL (ref 0.1–0.9)
Monocytes Relative: 8 %
NEUTROS ABS: 5.4 10*3/uL (ref 1.5–6.5)
Neutrophils Relative %: 71 %
PLATELETS: 214 10*3/uL (ref 145–400)
RBC: 4.73 MIL/uL (ref 3.70–5.45)
RDW: 12.7 % (ref 11.2–14.5)
WBC: 7.5 10*3/uL (ref 3.9–10.3)

## 2018-03-08 LAB — RETICULOCYTES
RBC.: 4.73 MIL/uL (ref 3.70–5.45)
RETIC COUNT ABSOLUTE: 89.9 10*3/uL (ref 33.7–90.7)
Retic Ct Pct: 1.9 % (ref 0.7–2.1)

## 2018-03-09 LAB — FERRITIN: Ferritin: 253 ng/mL (ref 9–269)

## 2018-03-09 LAB — IRON AND TIBC
Iron: 81 ug/dL (ref 41–142)
SATURATION RATIOS: 31 % (ref 21–57)
TIBC: 260 ug/dL (ref 236–444)
UIBC: 180 ug/dL

## 2018-03-12 ENCOUNTER — Telehealth: Payer: Self-pay

## 2018-03-12 NOTE — Telephone Encounter (Signed)
-----   Message from Truitt Merle, MD sent at 03/10/2018  4:23 PM EDT ----- Please let pt know that her iron level was adequate, thanks   Truitt Merle  03/10/2018

## 2018-03-12 NOTE — Telephone Encounter (Signed)
Notified patient per Dr. Burr Medico iron level is adequate right now, patient verbalized an understandin.

## 2018-05-24 ENCOUNTER — Ambulatory Visit (HOSPITAL_COMMUNITY)
Admission: RE | Admit: 2018-05-24 | Discharge: 2018-05-24 | Disposition: A | Discharge status: Home / Self Care | Payer: Medicare Other | Source: Ambulatory Visit | Attending: Vascular Surgery | Admitting: Vascular Surgery

## 2018-05-24 ENCOUNTER — Ambulatory Visit: Payer: Medicare Other | Admitting: Family

## 2018-05-24 ENCOUNTER — Encounter (HOSPITAL_COMMUNITY): Payer: Medicare Other

## 2018-05-24 ENCOUNTER — Encounter: Payer: Self-pay | Admitting: Family

## 2018-05-24 ENCOUNTER — Other Ambulatory Visit: Payer: Self-pay

## 2018-05-24 VITALS — BP 174/71 | HR 73 | Temp 98.5°F | Resp 14 | Ht 65.0 in | Wt 150.0 lb

## 2018-05-24 DIAGNOSIS — I6523 Occlusion and stenosis of bilateral carotid arteries: Secondary | ICD-10-CM | POA: Insufficient documentation

## 2018-05-24 DIAGNOSIS — I771 Stricture of artery: Secondary | ICD-10-CM | POA: Diagnosis not present

## 2018-05-24 DIAGNOSIS — Z87891 Personal history of nicotine dependence: Secondary | ICD-10-CM | POA: Insufficient documentation

## 2018-05-24 NOTE — Progress Notes (Signed)
Vitals:   05/24/18 1541  BP: (!) 169/75  Pulse: 73  Resp: 14  Temp: 98.5 F (36.9 C)  TempSrc: Oral  SpO2: 94%  Weight: 150 lb (68 kg)  Height: 5\' 5"  (1.651 m)

## 2018-05-24 NOTE — Patient Instructions (Signed)

## 2018-05-24 NOTE — Progress Notes (Signed)
Chief Complaint: Follow up Extracranial Carotid Artery Stenosis   History of Present Illness  Amy [name] is a 82 y.o. 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   She has a chronic history of her left upper extremity blood pressure being lower than her right arm blood pressure. She has noticed this when she monitors her blood pressure at home. She began have pain in her right shoulder and was curious whether or not this had anything to do with her blood pressure discrepancy.  Dr. Oneida Alar reviewed her records from 2011. She had a 20 mm gradient between her left brachial to right brachial pressure at that time. She occasionally develops  dizziness if her head is tilted back and she is working above her head. She actually has her children change light bulbs in-house because it makes her dizzy. She has mild exertional fatigue in the left lower extremity. On the right side she complains primarily of pain in the upper arm and shoulder area.  She currently denies any symptoms of TIA amaurosis or stroke. Other medical problems include hypertension and elevated cholesterol both of which are currently stable. She is a former smoker, quit in 2009.  She is on daily aspirin but not a statin.  She had a left carotid endarterectomy in 2011 by Dr. Oneida Alar. This was for symptomatic carotid stenosis. But pt states she was told that she had not had a stroke.   Dr. Oneida Alar last evaluated pt on 11-10-16. At that time upper extremity arterial duplex showed triphasic waveforms on the right, monophasic on the left, suggestive of left subclavian artery stenosis.  The patient also had an MRA of the neck performed December 2017. Innominate stenosis was estimated at 30-50%. Left common carotid stenosis was estimated at 50%. Left subclavian stenosis is estimated at 70% or greater. There was no antegrade flow in the left vertebral artery suggestive of  reversal of flow secondary to the subclavian stenosis Patient's blood pressure discrepancy can be explained by her left subclavian stenosis. This has probably been present for several years as she had a blood pressure discrepancy as far back as 2011. She currently probably has some symptoms from this with left arm exertional fatigue and some occasional dizziness when working with her left arm above her head. Dr. Oneida Alar did not believe there was any vascular etiology for her right shoulder pain which was her primary complaint in the office that day. Dr. Oneida Alar discussed with her the possibility of an arteriogram for possible angioplasty or stenting of her left subclavian stenosis or consideration of left carotid to subclavian bypass for improvement of her dizziness and left arm exertional fatigue symptoms. At that time pt did not seem interested in this. Dr. Oneida Alar indicated that he believes her overall stroke risk is low. Of note her left carotid endarterectomy site was patent and she did have a moderate carotid stenosis on the right side. Dr. Oneida Alar discussed with the patient that she should always have the blood pressure measured in her right arm as this is going to be more accurate than the left arm which is going to be artificially low. If she wishes to have an intervention on her left subclavian artery she can call Dr. Oneida Alar at any time and we can consider this. Otherwise she needs a repeat carotid duplex exam for surveillance in 6 months. We'll leave at the consideration of her primary care physician whether or not to start  a statin however there is evidence that patients with peripheral arterial/carotid disease have increased lifespan if on a statin. Dr. Oneida Alar advised that pt continue her aspirin.  Daughter states that pt long term use of medications to help her severe generalized arthritis caused a GI ulcer and bleeding; has had PRBC and iron transfusions; states her HCT was 6.5, increased to  11.1.   Pt states she has had 3 back surgeries, and daughter states that left leg stays numb due to back problems. She also states that her c-spine has some stenosis that Dr. Maxie Better told her is causing her left arm to be numb when she uses her left arm  She denies any subsequent stroke or TIA since the 2011 neurological event prior to the left CEA in 2011.   Last serum creatinine result on file was 1.9 on 04-11-17, GFR 25, stage 4 CKD.    She has CAD monitored by Dr. Acie Fredrickson.     Diabetic: no Tobacco use: former smoker, quit in 2009, states she was a light smoker, smoked x 20 years  Pt meds include: Statin : no, was taking for a long time, was stopped due to thinking that it worsened her arthralgias.  ASA: yes Other anticoagulants/antiplatelets: no   Past Medical History:  Diagnosis Date  . Arthritis   . HTN (hypertension)   . Hypercholesterolemia     Social History Social History   Tobacco Use  . Smoking status: Former Smoker    Packs/day: 0.25    Years: 20.00    Pack years: 5.00  . Smokeless tobacco: Never Used  . Tobacco comment: Quite 2009  Substance Use Topics  . Alcohol use: No  . Drug use: No    Family History Family History  Problem Relation Age of Onset  . Diabetes Father   . Diabetes Other   . Cancer Maternal Uncle        unknown type cancer   . CAD Neg Hx   . Stroke Neg Hx     Surgical History Past Surgical History:  Procedure Laterality Date  . BACK SURGERY    . CARDIAC CATHETERIZATION  01-02-2009   The anterior descending artery is moderate  in size. There is an eccentric 60-70% proximal stenosis followed by 50-60% mid stenosis. EF 75-80%  . CARDIOVASCULAR STRESS TEST  12-26-2008   EF 65%  . CATARACT EXTRACTION    . COLONOSCOPY N/A 02/15/2017   Procedure: COLONOSCOPY;  Surgeon: Milus Banister, MD;  Location: [facility];  Service: Endoscopy;  Laterality: N/A;  . CYSTECTOMY     Right Hand  . HEMORROIDECTOMY    . TONSILLECTOMY    . TOTAL  ABDOMINAL HYSTERECTOMY      Allergies  Allergen Reactions  . Codeine Nausea And Vomiting    REACTION: nausea/vomiting  . Sulfonamide Derivatives Itching and Rash    Current Outpatient Medications  Medication Sig Dispense Refill  . amLODipine (NORVASC) 10 MG tablet Take 10 mg by mouth daily.    Marland Kitchen aspirin 81 MG tablet Take 81 mg by mouth daily.    Marland Kitchen atenolol (TENORMIN) 50 MG tablet Take 50 mg by mouth daily.    Marland Kitchen CALCIUM-VITAMIN D PO Take 1 tablet by mouth 2 (two) times daily.    . cholecalciferol (VITAMIN D) 400 units TABS tablet Take 400 Units by mouth daily.    . cyclobenzaprine (FLEXERIL) 10 MG tablet     . diclofenac sodium (VOLTAREN) 1 % GEL APPLY 2 G TO AFFECTED JOINTS UP  TO 3 TIMES DAILY AS NEEDED  6  . fluticasone (FLONASE) 50 MCG/ACT nasal spray Place 2 sprays into both nostrils at bedtime.    Marland Kitchen HYDROcodone-acetaminophen (NORCO/VICODIN) 5-325 MG tablet Take 1 tablet by mouth every 6 (six) hours as needed for moderate pain.     Marland Kitchen loratadine (CLARITIN) 10 MG tablet Take 10 mg by mouth daily.    . meclizine (ANTIVERT) 25 MG tablet Take 25 mg by mouth 3 (three) times daily as needed for dizziness.    . Multiple Vitamin (MULTIVITAMIN WITH MINERALS) TABS tablet Take 1 tablet by mouth daily.    . nitroGLYCERIN (NITROSTAT) 0.4 MG SL tablet Place 0.4 mg under the tongue every 5 (five) minutes as needed for chest pain.     . pantoprazole (PROTONIX) 40 MG tablet Take 1 tablet (40 mg total) by mouth daily. 30 tablet 6  . telmisartan-hydrochlorothiazide (MICARDIS HCT) 80-25 MG tablet Take 1 tablet by mouth daily.    . vitamin B-12 (CYANOCOBALAMIN) 1000 MCG tablet Take 1,000 mcg by mouth daily.     . vitamin E 400 UNIT capsule Take 400 Units by mouth daily.    . ferrous sulfate 325 (65 FE) MG tablet Take 325 mg by mouth 3 (three) times daily with meals.      No current facility-administered medications for this visit.     Review of Systems : See HPI for pertinent positives and  negatives.  Physical Examination  Vitals:   05/24/18 1541 05/24/18 1545  BP: (!) 169/75 (!) 174/71  Pulse: 73 73  Resp: 14   Temp: 98.5 F (36.9 C)   TempSrc: Oral   SpO2: 94%   Weight: 150 lb (68 kg)   Height: 5\' 5"  (1.651 m)    Body mass index is 24.96 kg/m.  General: WDWN female in NAD GAIT: normal HENT: no obvious abnormalities  Eyes: PERRLA Pulmonary:  Respirations are non-labored, good air movement in all fields, CTAB, no rales, rhonchi, or wheezing. Cardiac: regular rhythm and rate, +murmur.  VASCULAR EXAM Carotid Bruits Right Left   Positive Positive    Abdominal aortic pulse is not palpable. Radial pulses: 2+ palpable bilaterally                                                                                                                                      LE Pulses Right Left       POPLITEAL  not palpable   not palpable       POSTERIOR TIBIAL  not palpable   not palpable        DORSALIS PEDIS      ANTERIOR TIBIAL faintly palpable  not palpable     Gastrointestinal: soft, nontender, BS WNL, no r/g, not palpable masses. Musculoskeletal: No muscle atrophy/wasting. M/S 5/5 in upper extremities, 4/5 in lower extremties, extremities without ischemic changes. Neurologic:  A&O X 3; appropriate affect, sensation is normal; speech is  normal, CN 2-12 intact except is hard of hearing, pain and light touch intact in extremities, motor exam as listed above. Skin: No rashes, no ulcers, no cellulitis.   Psychiatric: Normal thought content, mood appropriate to clinical situation.    Assessment: Amy [name] is a 81 y.o. female who presents with non life limiting mildly symptomatic left subclavian artery stenosis, brachial pressures today are about equal, left has been significantly less in the past. She has normal sensation and motor function in both hands at this time. She states her spine surgeon told her that the numbness she has at times in her left  hand and arm is likely due to c-spine stenosis.    She is also s/p left carotid endarterectomy in 2011 for symptomatic carotid stenosis. She has had no subsequent stroke or TIA.   Pt states her PCP has been working on her blood pressure for about a year and is aware of her heart murmur.   Will obtain bilateral upper extremity arterial duplex if pt becomes more than mildly symptomatic in either arm.  Her atherosclerotic risk factors include 20 year hx of light smoking (quit in 2009), CAD, stage 4 CKD as of June 2018, and hypertension. Fortunately she does not have DM. She takes a daily ASA, does not take a statin.     DATA Carotid Duplex (05-24-18): Right ICA: 1-39% stenosis Left ICA: CEA site with 1-39% stenosis Right vertebral artery flow is antegrade, left is high resistant. Right subclavian artery waveforms are multiphasic, left are monophasic Technically difficult study due to patient body habitus and respiratory patterns.   No significant change compared to the exam on 05-18-17.    Plan: Follow-up in 1 year with Carotid Duplex scan.    I discussed in depth with the patient the nature of atherosclerosis, and emphasized the importance of maximal medical management including strict control of blood pressure, blood glucose, and lipid levels, obtaining regular exercise, and continued cessation of smoking.  The patient is aware that without maximal medical management the underlying atherosclerotic disease process will progress, limiting the benefit of any interventions. The patient was given information about stroke prevention and what symptoms should prompt the patient to seek immediate medical care. Thank you for allowing [place] to participate in this patient's care.  Clemon Chambers, RN, MSN, FNP-C [facility] Office: [facility] Physician: Oneida Alar  05/24/18 4:11 PM

## 2018-06-01 ENCOUNTER — Telehealth: Payer: Self-pay | Admitting: Hematology

## 2018-06-01 NOTE — Telephone Encounter (Signed)
Called pt re appts being moved per 7/26 sch msg - left vm for pt re appt change.

## 2018-06-04 ENCOUNTER — Inpatient Hospital Stay: Payer: Medicare Other

## 2018-06-04 ENCOUNTER — Telehealth: Payer: Self-pay

## 2018-06-04 ENCOUNTER — Telehealth: Payer: Self-pay | Admitting: Hematology

## 2018-06-04 ENCOUNTER — Inpatient Hospital Stay (HOSPITAL_BASED_OUTPATIENT_CLINIC_OR_DEPARTMENT_OTHER): Payer: Medicare Other | Admitting: Nurse Practitioner

## 2018-06-04 ENCOUNTER — Encounter: Payer: Self-pay | Admitting: Nurse Practitioner

## 2018-06-04 ENCOUNTER — Inpatient Hospital Stay: Payer: Medicare Other | Admitting: Nurse Practitioner

## 2018-06-04 ENCOUNTER — Inpatient Hospital Stay: Payer: Medicare Other | Attending: Hematology

## 2018-06-04 VITALS — BP 161/51 | HR 77 | Temp 98.6°F | Resp 15 | Ht 65.0 in | Wt 147.3 lb

## 2018-06-04 DIAGNOSIS — D500 Iron deficiency anemia secondary to blood loss (chronic): Secondary | ICD-10-CM

## 2018-06-04 DIAGNOSIS — D509 Iron deficiency anemia, unspecified: Secondary | ICD-10-CM | POA: Diagnosis not present

## 2018-06-04 LAB — FERRITIN: Ferritin: 279 ng/mL (ref 11–307)

## 2018-06-04 LAB — CBC WITH DIFFERENTIAL/PLATELET
BASOS ABS: 0 10*3/uL (ref 0.0–0.1)
Basophils Relative: 0 %
Eosinophils Absolute: 0.2 10*3/uL (ref 0.0–0.5)
Eosinophils Relative: 2 %
HEMATOCRIT: 41.4 % (ref 34.8–46.6)
HEMOGLOBIN: 13.7 g/dL (ref 11.6–15.9)
LYMPHS ABS: 1.8 10*3/uL (ref 0.9–3.3)
Lymphocytes Relative: 20 %
MCH: 30.9 pg (ref 25.1–34.0)
MCHC: 33.1 g/dL (ref 31.5–36.0)
MCV: 93.2 fL (ref 79.5–101.0)
Monocytes Absolute: 0.8 10*3/uL (ref 0.1–0.9)
Monocytes Relative: 9 %
Neutro Abs: 6.2 10*3/uL (ref 1.5–6.5)
Neutrophils Relative %: 69 %
Platelets: 212 10*3/uL (ref 145–400)
RBC: 4.44 MIL/uL (ref 3.70–5.45)
RDW: 13 % (ref 11.2–14.5)
WBC: 9 10*3/uL (ref 3.9–10.3)

## 2018-06-04 LAB — RETICULOCYTES
RBC.: 4.44 MIL/uL (ref 3.70–5.45)
RETIC COUNT ABSOLUTE: 111 10*3/uL — AB (ref 33.7–90.7)
Retic Ct Pct: 2.5 % — ABNORMAL HIGH (ref 0.7–2.1)

## 2018-06-04 LAB — IRON AND TIBC
IRON: 69 ug/dL (ref 41–142)
SATURATION RATIOS: 26 % (ref 21–57)
TIBC: 266 ug/dL (ref 236–444)
UIBC: 197 ug/dL

## 2018-06-04 NOTE — Telephone Encounter (Signed)
Left voice message for patient, per Cira Rue NP lab studies are normal, she does not need to restart oral iron. Lab in 6 months with f/u in 1 year as discussed.

## 2018-06-04 NOTE — Progress Notes (Signed)
[facility]  [phone] [phone]  Clinic Follow up Note   Patient Care Team: Burnard Bunting, MD as PCP - General (Internal Medicine) Truitt Merle, MD as Consulting Physician (Hematology) Irene Shipper, MD as Consulting Physician (Gastroenterology) 06/04/2018  DIAGNOSIS: anemia   INTERVAL HISTORY: Ms. Deiter returns for lab and f/u as scheduled. She was last seen by Dr. Burr Medico in 08/2017. Has been off oral iron therapy since then. She denies changes in her health since then. Her energy level remains low at baseline. She is able to complete ADLs. Appetite is normal. Has diverticulosis, but otherwise denies n/v, constipation, or diarrhea. Denies blood in her stool.  She sees GI on PRN basis. Denies recent fever, chills, cough, chest pain, or dyspnea. Her chronic pain is at baseline. She takes a baby aspirin daily but otherwise no NSAIDs.    REVIEW OF SYSTEMS:   Constitutional: Denies fevers, chills or abnormal weight loss (+) fatigue  Respiratory: Denies cough, dyspnea or wheezes Cardiovascular: Denies palpitation, chest discomfort or lower extremity swelling Gastrointestinal:  Denies nausea, vomiting, constipation, diarrhea, hematochezia, heartburn or change in bowel habits (+) diverticulosis Skin: Denies abnormal skin rashes Lymphatics: Denies new lymphadenopathy or easy bruising Neurological:Denies numbness, tingling or new weaknesses Behavioral/Psych: Mood is stable, no new changes  MSK: (+) Chronic pain, arthritis All other systems were reviewed with the patient and are negative.  MEDICAL HISTORY:  Past Medical History:  Diagnosis Date  . Arthritis   . HTN (hypertension)   . Hypercholesterolemia     SURGICAL HISTORY: Past Surgical History:  Procedure Laterality Date  . BACK SURGERY    . CARDIAC CATHETERIZATION  01-02-2009   The anterior descending artery is moderate  in size. There is an eccentric 60-70% proximal stenosis followed by  50-60% mid stenosis. EF 75-80%  . CARDIOVASCULAR STRESS TEST  12-26-2008   EF 65%  . CATARACT EXTRACTION    . COLONOSCOPY N/A 02/15/2017   Procedure: COLONOSCOPY;  Surgeon: Milus Banister, MD;  Location: [facility];  Service: Endoscopy;  Laterality: N/A;  . CYSTECTOMY     Right Hand  . HEMORROIDECTOMY    . TONSILLECTOMY    . TOTAL ABDOMINAL HYSTERECTOMY      I have reviewed the social history and family history with the patient and they are unchanged from previous note.  ALLERGIES:  is allergic to codeine and sulfonamide derivatives.  MEDICATIONS:  Current Outpatient Medications  Medication Sig Dispense Refill  . amLODipine (NORVASC) 10 MG tablet Take 10 mg by mouth daily.    Marland Kitchen aspirin 81 MG tablet Take 81 mg by mouth daily.    Marland Kitchen atenolol (TENORMIN) 50 MG tablet Take 50 mg by mouth daily.    Marland Kitchen CALCIUM-VITAMIN D PO Take 1 tablet by mouth 2 (two) times daily.    . cholecalciferol (VITAMIN D) 400 units TABS tablet Take 400 Units by mouth daily.    . cyclobenzaprine (FLEXERIL) 10 MG tablet     . diclofenac sodium (VOLTAREN) 1 % GEL APPLY 2 G TO AFFECTED JOINTS UP TO 3 TIMES DAILY AS NEEDED  6  . fluticasone (FLONASE) 50 MCG/ACT nasal spray Place 2 sprays into both nostrils at bedtime.    Marland Kitchen HYDROcodone-acetaminophen (NORCO/VICODIN) 5-325 MG tablet Take 1 tablet by mouth every 6 (six) hours as needed for moderate pain.     Marland Kitchen loratadine (CLARITIN) 10 MG tablet Take 10 mg by mouth daily.    . meclizine (ANTIVERT) 25 MG tablet Take 25 mg  by mouth 3 (three) times daily as needed for dizziness.    . Multiple Vitamin (MULTIVITAMIN WITH MINERALS) TABS tablet Take 1 tablet by mouth daily.    . pantoprazole (PROTONIX) 40 MG tablet Take 1 tablet (40 mg total) by mouth daily. 30 tablet 6  . telmisartan-hydrochlorothiazide (MICARDIS HCT) 80-25 MG tablet Take 1 tablet by mouth daily.    . vitamin B-12 (CYANOCOBALAMIN) 1000 MCG tablet Take 1,000 mcg by mouth daily.     . vitamin E 400 UNIT capsule  Take 400 Units by mouth daily.    . nitroGLYCERIN (NITROSTAT) 0.4 MG SL tablet Place 0.4 mg under the tongue every 5 (five) minutes as needed for chest pain.      No current facility-administered medications for this visit.     PHYSICAL EXAMINATION: ECOG PERFORMANCE STATUS: 2 - Symptomatic, <50% confined to bed  Vitals:   06/04/18 1539  BP: (!) 161/51  Pulse: 77  Resp: 15  Temp: 98.6 F (37 C)  SpO2: 96%   Filed Weights   06/04/18 1539  Weight: 147 lb 4.8 oz (66.8 kg)    GENERAL:alert, no distress and comfortable SKIN: no rashes or significant lesions EYES: sclera clear OROPHARYNX:no thrush or ulcers  LYMPH:  no palpable cervical or supraclavicular lymphadenopathy LUNGS: clear to auscultation with normal breathing effort HEART: regular rate & rhythm and no murmurs and no lower extremity edema ABDOMEN:abdomen soft, non-tender and normal bowel sounds Musculoskeletal:no cyanosis of digits and no clubbing  NEURO: alert & oriented x 3 with fluent speech, no focal motor/sensory deficits  LABORATORY DATA:  I have reviewed the data as listed CBC Latest Ref Rng & Units 06/04/2018 03/08/2018 12/04/2017  WBC 3.9 - 10.3 K/uL 9.0 7.5 6.6  Hemoglobin 11.6 - 15.9 g/dL 13.7 14.7 13.5  Hematocrit 34.8 - 46.6 % 41.4 44.0 41.1  Platelets 145 - 400 K/uL 212 214 213     CMP Latest Ref Rng & Units 04/11/2017 04/11/2017 02/15/2017  Glucose 70 - 140 mg/dl 105 - 89  BUN 7.0 - 26.0 mg/dL 33.9(H) - 42(H)  Creatinine 0.6 - 1.1 mg/dL 1.9(H) - 1.49(H)  Sodium 136 - 145 mEq/L 136 - 135  Potassium 3.5 - 5.1 mEq/L 4.8 - 4.2  Chloride 101 - 111 mmol/L - - 101  CO2 22 - 29 mEq/L 22 - 23  Calcium 8.4 - 10.4 mg/dL 9.2 - 9.0  Total Protein 6.0 - 8.5 g/dL 7.1 6.5 -  Total Bilirubin 0.20 - 1.20 mg/dL 0.22 - -  Alkaline Phos 40 - 150 U/L 107 - -  AST 5 - 34 U/L 16 - -  ALT 0 - 55 U/L 16 - -      RADIOGRAPHIC STUDIES: I have personally reviewed the radiological images as listed and agreed with the  findings in the report. No results found.   ASSESSMENT & PLAN: 82 y.o. Caucasian female, with past medical history of hypertension, hypercholesterolemia, arthritis on chronic NSAIDs, presented with worsening anemia  1. Anemia of iron deficiency  2. HTN, arthritis  3. CKD, stage IV  Ms. Rane is doing well from anemia standpoint. She is off iron therapy and NSAIDs. Iron studies are normal. Her anemia resolved. She maintains close f/u with cardiology and PCP, and GI when necessary. Will check labs in 6 and 12 months, and see her back in 1 year. I reviewed s/sx of worsening anemia such as increased fatigue, headache, palpitation, and bleeding. Her daughter was present, who knows signs to watch for. Patient agrees  with the plan.  PLAN -Labs reviewed  -Lab in 6, 12 months -F/u with Dr. Burr Medico in 1 year  All questions were answered. The patient knows to call the clinic with any problems, questions or concerns. No barriers to learning was detected.     Alla Feeling, NP 06/04/18

## 2018-06-04 NOTE — Telephone Encounter (Signed)
Appointments scheduled AVS/Calendar printed per 7/29 los °

## 2018-06-04 NOTE — Telephone Encounter (Signed)
-----   Message from Alla Feeling, NP sent at 06/04/2018  4:30 PM EDT ----- Please let her know iron studies are normal. She does not need to restart oral iron. Lab in 6 months, f/u in 1 year as she and I discussed.  Thanks, Regan Rakers NP

## 2018-06-08 ENCOUNTER — Ambulatory Visit: Payer: Medicare Other | Admitting: Nurse Practitioner

## 2018-06-08 ENCOUNTER — Other Ambulatory Visit: Payer: Medicare Other

## 2018-09-18 ENCOUNTER — Other Ambulatory Visit: Payer: Self-pay | Admitting: Internal Medicine

## 2018-12-05 ENCOUNTER — Other Ambulatory Visit: Payer: Medicare Other

## 2019-04-15 ENCOUNTER — Other Ambulatory Visit: Payer: Self-pay | Admitting: Internal Medicine

## 2019-04-16 ENCOUNTER — Telehealth: Payer: Self-pay | Admitting: Hematology

## 2019-04-16 NOTE — Telephone Encounter (Signed)
CALL DAY 7/27 MOVED LAB/FU MOVED TO 8/6 - CONFIRMED WITH PATIENT

## 2019-06-05 ENCOUNTER — Ambulatory Visit: Payer: Medicare Other | Admitting: Hematology

## 2019-06-05 ENCOUNTER — Other Ambulatory Visit: Payer: Medicare Other

## 2019-06-17 ENCOUNTER — Telehealth: Payer: Self-pay | Admitting: Hematology

## 2019-06-17 NOTE — Telephone Encounter (Signed)
R/s appt per 8/09 sch message - pt aware of new appt date and time

## 2019-06-19 ENCOUNTER — Other Ambulatory Visit: Payer: Medicare Other

## 2019-06-19 ENCOUNTER — Ambulatory Visit: Payer: Medicare Other | Admitting: Hematology

## 2019-12-16 NOTE — Progress Notes (Signed)
[facility]   [phone] [phone]) [phone]   Clinic Follow up Note   Patient Care Team: Burnard Bunting, MD as PCP - General (Internal Medicine) Truitt Merle, MD as Consulting Physician (Hematology) Irene Shipper, MD as Consulting Physician (Gastroenterology)  Date of Service:  12/20/2019  CHIEF COMPLAINT: Follow up Anemia    CURRENT THERAPY:  Ferrous sulfate 1 tablet daily, IV Feraheme as needed  INTERVAL HISTORY:  Amy [name] is here for a follow up of anemia. She was last seen by me in 08/2017. She was seen by NP Lacie in 05/2018 in interim. She presents to the clinic with her family member.  She is clinically stable, has mild fatigue, able to do all self-care.  She uses of cane when she goes out, and she did come in with a wheelchair.  She denies any significant pain, chest discomfort or shortness of breath.  No leg edema.  Review of system otherwise negative.   MEDICAL HISTORY:  Past Medical History:  Diagnosis Date  . Arthritis   . HTN (hypertension)   . Hypercholesterolemia     SURGICAL HISTORY: Past Surgical History:  Procedure Laterality Date  . BACK SURGERY    . CARDIAC CATHETERIZATION  01-02-2009   The anterior descending artery is moderate  in size. There is an eccentric 60-70% proximal stenosis followed by 50-60% mid stenosis. EF 75-80%  . CARDIOVASCULAR STRESS TEST  12-26-2008   EF 65%  . CATARACT EXTRACTION    . COLONOSCOPY N/A 02/15/2017   Procedure: COLONOSCOPY;  Surgeon: Milus Banister, MD;  Location: [facility];  Service: Endoscopy;  Laterality: N/A;  . CYSTECTOMY     Right Hand  . HEMORROIDECTOMY    . TONSILLECTOMY    . TOTAL ABDOMINAL HYSTERECTOMY      I have reviewed the social history and family history with the patient and they are unchanged from previous note.  ALLERGIES:  is allergic to codeine and sulfonamide derivatives.  MEDICATIONS:  Current Outpatient Medications  Medication Sig Dispense Refill  .  amLODipine (NORVASC) 10 MG tablet Take 10 mg by mouth daily.    Marland Kitchen aspirin 81 MG tablet Take 81 mg by mouth daily.    Marland Kitchen atenolol (TENORMIN) 50 MG tablet Take 50 mg by mouth daily.    Marland Kitchen CALCIUM-VITAMIN D PO Take 1 tablet by mouth 2 (two) times daily.    . cholecalciferol (VITAMIN D) 400 units TABS tablet Take 400 Units by mouth daily.    . cyclobenzaprine (FLEXERIL) 10 MG tablet     . diclofenac sodium (VOLTAREN) 1 % GEL APPLY 2 G TO AFFECTED JOINTS UP TO 3 TIMES DAILY AS NEEDED  6  . fluticasone (FLONASE) 50 MCG/ACT nasal spray Place 2 sprays into both nostrils at bedtime.    Marland Kitchen HYDROcodone-acetaminophen (NORCO/VICODIN) 5-325 MG tablet Take 1 tablet by mouth every 6 (six) hours as needed for moderate pain.     Marland Kitchen loratadine (CLARITIN) 10 MG tablet Take 10 mg by mouth daily.    . meclizine (ANTIVERT) 25 MG tablet Take 25 mg by mouth 3 (three) times daily as needed for dizziness.    . Multiple Vitamin (MULTIVITAMIN WITH MINERALS) TABS tablet Take 1 tablet by mouth daily.    . nitroGLYCERIN (NITROSTAT) 0.4 MG SL tablet Place 0.4 mg under the tongue every 5 (five) minutes as needed for chest pain.     . pantoprazole (PROTONIX) 40 MG tablet Take 1 tablet (40 mg total) by mouth daily. Patient  needs office visit for further refills 90 tablet 0  . telmisartan-hydrochlorothiazide (MICARDIS HCT) 80-25 MG tablet Take 1 tablet by mouth daily.    . vitamin B-12 (CYANOCOBALAMIN) 1000 MCG tablet Take 1,000 mcg by mouth daily.     . vitamin E 400 UNIT capsule Take 400 Units by mouth daily.     No current facility-administered medications for this visit.    PHYSICAL EXAMINATION: ECOG PERFORMANCE STATUS: 1 - Symptomatic but completely ambulatory  Vitals:   12/20/19 1521  BP: (!) 170/58  Pulse: 75  Resp: 16  Temp: 98.3 F (36.8 C)  SpO2: 100%   Filed Weights   12/20/19 1521  Weight: 154 lb 4.8 oz (70 kg)    GENERAL:alert, no distress and comfortable SKIN: skin color, texture, turgor are normal, no  rashes or significant lesions NEURO: alert & oriented x 3 with fluent speech, no focal motor/sensory deficits  LABORATORY DATA:  I have reviewed the data as listed CBC Latest Ref Rng & Units 12/20/2019 06/04/2018 03/08/2018  WBC 4.0 - 10.5 K/uL 9.1 9.0 7.5  Hemoglobin 12.0 - 15.0 g/dL 14.0 13.7 14.7  Hematocrit 36.0 - 46.0 % 42.8 41.4 44.0  Platelets 150 - 400 K/uL 213 212 214     CMP Latest Ref Rng & Units 04/11/2017 04/11/2017 02/15/2017  Glucose 70 - 140 mg/dl 105 - 89  BUN 7.0 - 26.0 mg/dL 33.9(H) - 42(H)  Creatinine 0.6 - 1.1 mg/dL 1.9(H) - 1.49(H)  Sodium 136 - 145 mEq/L 136 - 135  Potassium 3.5 - 5.1 mEq/L 4.8 - 4.2  Chloride 101 - 111 mmol/L - - 101  CO2 22 - 29 mEq/L 22 - 23  Calcium 8.4 - 10.4 mg/dL 9.2 - 9.0  Total Protein 6.0 - 8.5 g/dL 7.1 6.5 -  Total Bilirubin 0.20 - 1.20 mg/dL 0.22 - -  Alkaline Phos 40 - 150 U/L 107 - -  AST 5 - 34 U/L 16 - -  ALT 0 - 55 U/L 16 - -      RADIOGRAPHIC STUDIES: I have personally reviewed the radiological images as listed and agreed with the findings in the report. No results found.   ASSESSMENT & PLAN:  Amy [name] is a 84 y.o. female with    1. Anemia of iron deficiency  -Her initial labs showed low serum iron, transferrin saturation, and borderline low ferritin, this is consistent with iron deficiency. Likely secondary to chronic ulcers from NSAIDS -However her degree of anemia seem to be out of portion of iron deficiency, she does have chronic renal failure, which likely also contributes to her anemia -She has responded to IV iron very well in 04/2017, her anemia has resolved now.  -Lab reviewed, hemoglobin normal, iron level still pending -She is clinically stable.  She will follow-up with her primary care physician and check an iron level 1-2 times a year, she knows to call me if needed.   2. HTN, arthritis  -Follow-up of his primary care physician, continue medication  3. CKD, stage IV -Follow up with primary care  physician, avoid nephrotoxic agents, such as NSAIDs and iv contrast   Plan -Lab results reviewed -She has not had anemia since 2018, I will see her as needed.  She will check CBC and iron studies with her family doctor Dr. Reynaldo Minium every 6 to 12 months.  She knows to call any if she develops iron deficiency or anemia again.    No problem-specific Assessment & Plan notes found for this  encounter.   No orders of the defined types were placed in this encounter.  All questions were answered. The patient knows to call the clinic with any problems, questions or concerns. No barriers to learning was detected. The total time spent in the appointment was 20 minutes.     Truitt Merle, MD 12/20/2019   I, Joslyn Devon, am acting as scribe for Truitt Merle, MD.   I have reviewed the above documentation for accuracy and completeness, and I agree with the above.

## 2019-12-18 ENCOUNTER — Other Ambulatory Visit: Payer: Self-pay | Admitting: Internal Medicine

## 2019-12-20 ENCOUNTER — Other Ambulatory Visit: Payer: Self-pay

## 2019-12-20 ENCOUNTER — Inpatient Hospital Stay: Payer: Medicare Other | Admitting: Hematology

## 2019-12-20 ENCOUNTER — Encounter: Payer: Self-pay | Admitting: Hematology

## 2019-12-20 ENCOUNTER — Telehealth: Payer: Self-pay | Admitting: Hematology

## 2019-12-20 ENCOUNTER — Inpatient Hospital Stay: Payer: Medicare Other | Attending: Hematology

## 2019-12-20 VITALS — BP 170/58 | HR 75 | Temp 98.3°F | Resp 16 | Ht 65.0 in | Wt 154.3 lb

## 2019-12-20 DIAGNOSIS — E7800 Pure hypercholesterolemia, unspecified: Secondary | ICD-10-CM | POA: Diagnosis not present

## 2019-12-20 DIAGNOSIS — N189 Chronic kidney disease, unspecified: Secondary | ICD-10-CM | POA: Diagnosis not present

## 2019-12-20 DIAGNOSIS — M1990 Unspecified osteoarthritis, unspecified site: Secondary | ICD-10-CM | POA: Insufficient documentation

## 2019-12-20 DIAGNOSIS — D638 Anemia in other chronic diseases classified elsewhere: Secondary | ICD-10-CM

## 2019-12-20 DIAGNOSIS — Z79899 Other long term (current) drug therapy: Secondary | ICD-10-CM | POA: Diagnosis not present

## 2019-12-20 DIAGNOSIS — Z7982 Long term (current) use of aspirin: Secondary | ICD-10-CM | POA: Insufficient documentation

## 2019-12-20 DIAGNOSIS — I129 Hypertensive chronic kidney disease with stage 1 through stage 4 chronic kidney disease, or unspecified chronic kidney disease: Secondary | ICD-10-CM | POA: Insufficient documentation

## 2019-12-20 DIAGNOSIS — D500 Iron deficiency anemia secondary to blood loss (chronic): Secondary | ICD-10-CM

## 2019-12-20 DIAGNOSIS — D509 Iron deficiency anemia, unspecified: Secondary | ICD-10-CM | POA: Diagnosis present

## 2019-12-20 LAB — CBC WITH DIFFERENTIAL/PLATELET
Abs Immature Granulocytes: 0.06 10*3/uL (ref 0.00–0.07)
Basophils Absolute: 0 10*3/uL (ref 0.0–0.1)
Basophils Relative: 0 %
Eosinophils Absolute: 0.4 10*3/uL (ref 0.0–0.5)
Eosinophils Relative: 4 %
HCT: 42.8 % (ref 36.0–46.0)
Hemoglobin: 14 g/dL (ref 12.0–15.0)
Immature Granulocytes: 1 %
Lymphocytes Relative: 27 %
Lymphs Abs: 2.4 10*3/uL (ref 0.7–4.0)
MCH: 31 pg (ref 26.0–34.0)
MCHC: 32.7 g/dL (ref 30.0–36.0)
MCV: 94.7 fL (ref 80.0–100.0)
Monocytes Absolute: 0.9 10*3/uL (ref 0.1–1.0)
Monocytes Relative: 10 %
Neutro Abs: 5.3 10*3/uL (ref 1.7–7.7)
Neutrophils Relative %: 58 %
Platelets: 213 10*3/uL (ref 150–400)
RBC: 4.52 MIL/uL (ref 3.87–5.11)
RDW: 12.7 % (ref 11.5–15.5)
WBC: 9.1 10*3/uL (ref 4.0–10.5)
nRBC: 0 % (ref 0.0–0.2)

## 2019-12-20 LAB — RETICULOCYTES
Immature Retic Fract: 9 % (ref 2.3–15.9)
RBC.: 4.52 MIL/uL (ref 3.87–5.11)
Retic Count, Absolute: 87.2 10*3/uL (ref 19.0–186.0)
Retic Ct Pct: 1.9 % (ref 0.4–3.1)

## 2019-12-20 NOTE — Telephone Encounter (Signed)
Per 2/12 los f/u open

## 2019-12-23 LAB — IRON AND TIBC
Iron: 71 ug/dL (ref 41–142)
Saturation Ratios: 25 % (ref 21–57)
TIBC: 280 ug/dL (ref 236–444)
UIBC: 209 ug/dL (ref 120–384)

## 2019-12-23 LAB — FERRITIN: Ferritin: 319 ng/mL — ABNORMAL HIGH (ref 11–307)

## 2019-12-24 ENCOUNTER — Telehealth: Payer: Self-pay | Admitting: *Deleted

## 2019-12-24 NOTE — Telephone Encounter (Signed)
-----   Message from Truitt Merle, MD sent at 12/24/2019 11:14 AM EST ----- Please let pt know her iron level from last week was good, she will f/u with PCP and see me as needed in future.   Truitt Merle

## 2019-12-24 NOTE — Telephone Encounter (Signed)
Pt returned call & informed that iron level good from last week & to f/u with her PCP & we can see her in the future if needed.

## 2019-12-24 NOTE — Telephone Encounter (Signed)
Ft message for pt to return call.

## 2020-07-10 ENCOUNTER — Other Ambulatory Visit: Payer: Self-pay | Admitting: Internal Medicine

## 2020-07-10 DIAGNOSIS — K529 Noninfective gastroenteritis and colitis, unspecified: Secondary | ICD-10-CM

## 2020-08-07 ENCOUNTER — Ambulatory Visit
Admission: RE | Admit: 2020-08-07 | Discharge: 2020-08-07 | Disposition: A | Discharge status: Home / Self Care | Payer: Medicare Other | Source: Ambulatory Visit | Attending: Internal Medicine | Admitting: Internal Medicine

## 2020-08-07 DIAGNOSIS — K529 Noninfective gastroenteritis and colitis, unspecified: Secondary | ICD-10-CM

## 2021-01-06 DIAGNOSIS — I10 Essential (primary) hypertension: Secondary | ICD-10-CM | POA: Diagnosis not present

## 2021-01-06 DIAGNOSIS — E663 Overweight: Secondary | ICD-10-CM | POA: Diagnosis not present

## 2021-01-06 DIAGNOSIS — R7301 Impaired fasting glucose: Secondary | ICD-10-CM | POA: Diagnosis not present

## 2021-01-06 DIAGNOSIS — R3915 Urgency of urination: Secondary | ICD-10-CM | POA: Diagnosis not present

## 2021-01-06 DIAGNOSIS — N390 Urinary tract infection, site not specified: Secondary | ICD-10-CM | POA: Diagnosis not present

## 2021-07-15 DIAGNOSIS — E785 Hyperlipidemia, unspecified: Secondary | ICD-10-CM | POA: Diagnosis not present

## 2021-07-15 DIAGNOSIS — R7301 Impaired fasting glucose: Secondary | ICD-10-CM | POA: Diagnosis not present

## 2021-07-21 DIAGNOSIS — I129 Hypertensive chronic kidney disease with stage 1 through stage 4 chronic kidney disease, or unspecified chronic kidney disease: Secondary | ICD-10-CM | POA: Diagnosis not present

## 2021-07-21 DIAGNOSIS — Z1331 Encounter for screening for depression: Secondary | ICD-10-CM | POA: Diagnosis not present

## 2021-07-21 DIAGNOSIS — I2510 Atherosclerotic heart disease of native coronary artery without angina pectoris: Secondary | ICD-10-CM | POA: Diagnosis not present

## 2021-07-21 DIAGNOSIS — D649 Anemia, unspecified: Secondary | ICD-10-CM | POA: Diagnosis not present

## 2021-07-21 DIAGNOSIS — R7301 Impaired fasting glucose: Secondary | ICD-10-CM | POA: Diagnosis not present

## 2021-07-21 DIAGNOSIS — M5416 Radiculopathy, lumbar region: Secondary | ICD-10-CM | POA: Diagnosis not present

## 2021-07-21 DIAGNOSIS — G458 Other transient cerebral ischemic attacks and related syndromes: Secondary | ICD-10-CM | POA: Diagnosis not present

## 2021-07-21 DIAGNOSIS — Z0000 Encounter for general adult medical examination without abnormal findings: Secondary | ICD-10-CM | POA: Diagnosis not present

## 2021-07-21 DIAGNOSIS — R82998 Other abnormal findings in urine: Secondary | ICD-10-CM | POA: Diagnosis not present

## 2021-07-21 DIAGNOSIS — I10 Essential (primary) hypertension: Secondary | ICD-10-CM | POA: Diagnosis not present

## 2021-07-21 DIAGNOSIS — T466X5A Adverse effect of antihyperlipidemic and antiarteriosclerotic drugs, initial encounter: Secondary | ICD-10-CM | POA: Diagnosis not present

## 2021-07-21 DIAGNOSIS — Z1339 Encounter for screening examination for other mental health and behavioral disorders: Secondary | ICD-10-CM | POA: Diagnosis not present

## 2022-01-31 DIAGNOSIS — R7301 Impaired fasting glucose: Secondary | ICD-10-CM | POA: Diagnosis not present

## 2022-01-31 DIAGNOSIS — I129 Hypertensive chronic kidney disease with stage 1 through stage 4 chronic kidney disease, or unspecified chronic kidney disease: Secondary | ICD-10-CM | POA: Diagnosis not present

## 2022-01-31 DIAGNOSIS — N184 Chronic kidney disease, stage 4 (severe): Secondary | ICD-10-CM | POA: Diagnosis not present

## 2022-01-31 DIAGNOSIS — H6122 Impacted cerumen, left ear: Secondary | ICD-10-CM | POA: Diagnosis not present

## 2022-01-31 DIAGNOSIS — M1990 Unspecified osteoarthritis, unspecified site: Secondary | ICD-10-CM | POA: Diagnosis not present

## 2022-07-18 DIAGNOSIS — R7301 Impaired fasting glucose: Secondary | ICD-10-CM | POA: Diagnosis not present

## 2022-07-18 DIAGNOSIS — I10 Essential (primary) hypertension: Secondary | ICD-10-CM | POA: Diagnosis not present

## 2022-07-18 DIAGNOSIS — E785 Hyperlipidemia, unspecified: Secondary | ICD-10-CM | POA: Diagnosis not present

## 2022-07-18 DIAGNOSIS — R7989 Other specified abnormal findings of blood chemistry: Secondary | ICD-10-CM | POA: Diagnosis not present

## 2022-07-25 DIAGNOSIS — M1990 Unspecified osteoarthritis, unspecified site: Secondary | ICD-10-CM | POA: Diagnosis not present

## 2022-07-25 DIAGNOSIS — I10 Essential (primary) hypertension: Secondary | ICD-10-CM | POA: Diagnosis not present

## 2022-07-25 DIAGNOSIS — N184 Chronic kidney disease, stage 4 (severe): Secondary | ICD-10-CM | POA: Diagnosis not present

## 2022-07-25 DIAGNOSIS — E785 Hyperlipidemia, unspecified: Secondary | ICD-10-CM | POA: Diagnosis not present

## 2022-07-25 DIAGNOSIS — Z1331 Encounter for screening for depression: Secondary | ICD-10-CM | POA: Diagnosis not present

## 2022-07-25 DIAGNOSIS — Z23 Encounter for immunization: Secondary | ICD-10-CM | POA: Diagnosis not present

## 2022-07-25 DIAGNOSIS — R0989 Other specified symptoms and signs involving the circulatory and respiratory systems: Secondary | ICD-10-CM | POA: Diagnosis not present

## 2022-07-25 DIAGNOSIS — I129 Hypertensive chronic kidney disease with stage 1 through stage 4 chronic kidney disease, or unspecified chronic kidney disease: Secondary | ICD-10-CM | POA: Diagnosis not present

## 2022-07-25 DIAGNOSIS — Z1339 Encounter for screening examination for other mental health and behavioral disorders: Secondary | ICD-10-CM | POA: Diagnosis not present

## 2022-07-25 DIAGNOSIS — Z0000 Encounter for general adult medical examination without abnormal findings: Secondary | ICD-10-CM | POA: Diagnosis not present

## 2022-07-25 DIAGNOSIS — R82998 Other abnormal findings in urine: Secondary | ICD-10-CM | POA: Diagnosis not present

## 2023-01-30 DIAGNOSIS — N390 Urinary tract infection, site not specified: Secondary | ICD-10-CM | POA: Diagnosis not present

## 2023-01-30 DIAGNOSIS — I129 Hypertensive chronic kidney disease with stage 1 through stage 4 chronic kidney disease, or unspecified chronic kidney disease: Secondary | ICD-10-CM | POA: Diagnosis not present

## 2023-01-30 DIAGNOSIS — N184 Chronic kidney disease, stage 4 (severe): Secondary | ICD-10-CM | POA: Diagnosis not present

## 2023-01-30 DIAGNOSIS — R7301 Impaired fasting glucose: Secondary | ICD-10-CM | POA: Diagnosis not present

## 2023-06-12 DIAGNOSIS — I129 Hypertensive chronic kidney disease with stage 1 through stage 4 chronic kidney disease, or unspecified chronic kidney disease: Secondary | ICD-10-CM | POA: Diagnosis not present

## 2023-06-12 DIAGNOSIS — D649 Anemia, unspecified: Secondary | ICD-10-CM | POA: Diagnosis not present

## 2023-06-12 DIAGNOSIS — N184 Chronic kidney disease, stage 4 (severe): Secondary | ICD-10-CM | POA: Diagnosis not present

## 2023-06-12 DIAGNOSIS — R42 Dizziness and giddiness: Secondary | ICD-10-CM | POA: Diagnosis not present

## 2023-06-12 DIAGNOSIS — N179 Acute kidney failure, unspecified: Secondary | ICD-10-CM | POA: Diagnosis not present

## 2023-06-12 DIAGNOSIS — R630 Anorexia: Secondary | ICD-10-CM | POA: Diagnosis not present

## 2023-06-12 DIAGNOSIS — E875 Hyperkalemia: Secondary | ICD-10-CM | POA: Diagnosis not present

## 2023-06-12 DIAGNOSIS — Z1152 Encounter for screening for COVID-19: Secondary | ICD-10-CM | POA: Diagnosis not present

## 2023-06-12 DIAGNOSIS — K2900 Acute gastritis without bleeding: Secondary | ICD-10-CM | POA: Diagnosis not present

## 2023-06-16 ENCOUNTER — Other Ambulatory Visit: Payer: Self-pay

## 2023-06-16 ENCOUNTER — Encounter (HOSPITAL_COMMUNITY): Payer: Self-pay | Admitting: Emergency Medicine

## 2023-06-16 ENCOUNTER — Inpatient Hospital Stay (HOSPITAL_COMMUNITY)
Admission: EM | Admit: 2023-06-16 | Discharge: 2023-06-28 | DRG: 673 | Disposition: A | Discharge status: Skilled Nursing Facility | Payer: Medicare HMO | Source: Ambulatory Visit | Attending: Internal Medicine | Admitting: Internal Medicine

## 2023-06-16 ENCOUNTER — Encounter: Payer: Self-pay | Admitting: Hematology

## 2023-06-16 ENCOUNTER — Emergency Department (HOSPITAL_COMMUNITY): Payer: Medicare HMO

## 2023-06-16 DIAGNOSIS — K2940 Chronic atrophic gastritis without bleeding: Secondary | ICD-10-CM | POA: Diagnosis present

## 2023-06-16 DIAGNOSIS — G4700 Insomnia, unspecified: Secondary | ICD-10-CM | POA: Diagnosis not present

## 2023-06-16 DIAGNOSIS — K8045 Calculus of bile duct with chronic cholecystitis with obstruction: Secondary | ICD-10-CM | POA: Diagnosis present

## 2023-06-16 DIAGNOSIS — K811 Chronic cholecystitis: Secondary | ICD-10-CM | POA: Diagnosis not present

## 2023-06-16 DIAGNOSIS — K2970 Gastritis, unspecified, without bleeding: Secondary | ICD-10-CM | POA: Diagnosis not present

## 2023-06-16 DIAGNOSIS — K449 Diaphragmatic hernia without obstruction or gangrene: Secondary | ICD-10-CM | POA: Diagnosis not present

## 2023-06-16 DIAGNOSIS — M4316 Spondylolisthesis, lumbar region: Secondary | ICD-10-CM | POA: Diagnosis present

## 2023-06-16 DIAGNOSIS — R630 Anorexia: Secondary | ICD-10-CM | POA: Diagnosis present

## 2023-06-16 DIAGNOSIS — E876 Hypokalemia: Secondary | ICD-10-CM | POA: Diagnosis not present

## 2023-06-16 DIAGNOSIS — E871 Hypo-osmolality and hyponatremia: Secondary | ICD-10-CM

## 2023-06-16 DIAGNOSIS — K8010 Calculus of gallbladder with chronic cholecystitis without obstruction: Secondary | ICD-10-CM | POA: Diagnosis not present

## 2023-06-16 DIAGNOSIS — M19071 Primary osteoarthritis, right ankle and foot: Secondary | ICD-10-CM | POA: Diagnosis not present

## 2023-06-16 DIAGNOSIS — E7800 Pure hypercholesterolemia, unspecified: Secondary | ICD-10-CM | POA: Diagnosis present

## 2023-06-16 DIAGNOSIS — N3001 Acute cystitis with hematuria: Secondary | ICD-10-CM | POA: Diagnosis not present

## 2023-06-16 DIAGNOSIS — R0689 Other abnormalities of breathing: Secondary | ICD-10-CM | POA: Diagnosis not present

## 2023-06-16 DIAGNOSIS — R918 Other nonspecific abnormal finding of lung field: Secondary | ICD-10-CM | POA: Diagnosis not present

## 2023-06-16 DIAGNOSIS — N178 Other acute kidney failure: Secondary | ICD-10-CM | POA: Diagnosis not present

## 2023-06-16 DIAGNOSIS — M79604 Pain in right leg: Secondary | ICD-10-CM | POA: Diagnosis not present

## 2023-06-16 DIAGNOSIS — F17210 Nicotine dependence, cigarettes, uncomplicated: Secondary | ICD-10-CM | POA: Diagnosis present

## 2023-06-16 DIAGNOSIS — K253 Acute gastric ulcer without hemorrhage or perforation: Secondary | ICD-10-CM

## 2023-06-16 DIAGNOSIS — E860 Dehydration: Secondary | ICD-10-CM | POA: Diagnosis present

## 2023-06-16 DIAGNOSIS — I7143 Infrarenal abdominal aortic aneurysm, without rupture: Secondary | ICD-10-CM | POA: Diagnosis not present

## 2023-06-16 DIAGNOSIS — R195 Other fecal abnormalities: Secondary | ICD-10-CM | POA: Diagnosis not present

## 2023-06-16 DIAGNOSIS — Z833 Family history of diabetes mellitus: Secondary | ICD-10-CM

## 2023-06-16 DIAGNOSIS — K274 Chronic or unspecified peptic ulcer, site unspecified, with hemorrhage: Secondary | ICD-10-CM | POA: Diagnosis present

## 2023-06-16 DIAGNOSIS — R109 Unspecified abdominal pain: Secondary | ICD-10-CM

## 2023-06-16 DIAGNOSIS — Z66 Do not resuscitate: Secondary | ICD-10-CM | POA: Diagnosis not present

## 2023-06-16 DIAGNOSIS — E872 Acidosis, unspecified: Secondary | ICD-10-CM

## 2023-06-16 DIAGNOSIS — D631 Anemia in chronic kidney disease: Secondary | ICD-10-CM | POA: Diagnosis not present

## 2023-06-16 DIAGNOSIS — N189 Chronic kidney disease, unspecified: Secondary | ICD-10-CM | POA: Diagnosis not present

## 2023-06-16 DIAGNOSIS — J9601 Acute respiratory failure with hypoxia: Secondary | ICD-10-CM | POA: Diagnosis not present

## 2023-06-16 DIAGNOSIS — K5900 Constipation, unspecified: Secondary | ICD-10-CM | POA: Diagnosis present

## 2023-06-16 DIAGNOSIS — D509 Iron deficiency anemia, unspecified: Secondary | ICD-10-CM | POA: Diagnosis present

## 2023-06-16 DIAGNOSIS — R0602 Shortness of breath: Secondary | ICD-10-CM | POA: Diagnosis not present

## 2023-06-16 DIAGNOSIS — Z87891 Personal history of nicotine dependence: Secondary | ICD-10-CM | POA: Diagnosis not present

## 2023-06-16 DIAGNOSIS — E538 Deficiency of other specified B group vitamins: Secondary | ICD-10-CM | POA: Diagnosis present

## 2023-06-16 DIAGNOSIS — I10 Essential (primary) hypertension: Secondary | ICD-10-CM | POA: Diagnosis present

## 2023-06-16 DIAGNOSIS — K2900 Acute gastritis without bleeding: Secondary | ICD-10-CM | POA: Diagnosis not present

## 2023-06-16 DIAGNOSIS — K2950 Unspecified chronic gastritis without bleeding: Secondary | ICD-10-CM | POA: Diagnosis not present

## 2023-06-16 DIAGNOSIS — K219 Gastro-esophageal reflux disease without esophagitis: Secondary | ICD-10-CM | POA: Diagnosis present

## 2023-06-16 DIAGNOSIS — N1330 Unspecified hydronephrosis: Secondary | ICD-10-CM | POA: Diagnosis not present

## 2023-06-16 DIAGNOSIS — I959 Hypotension, unspecified: Secondary | ICD-10-CM | POA: Diagnosis not present

## 2023-06-16 DIAGNOSIS — J90 Pleural effusion, not elsewhere classified: Secondary | ICD-10-CM | POA: Diagnosis not present

## 2023-06-16 DIAGNOSIS — K8020 Calculus of gallbladder without cholecystitis without obstruction: Secondary | ICD-10-CM

## 2023-06-16 DIAGNOSIS — E875 Hyperkalemia: Secondary | ICD-10-CM

## 2023-06-16 DIAGNOSIS — K8070 Calculus of gallbladder and bile duct without cholecystitis without obstruction: Secondary | ICD-10-CM | POA: Diagnosis not present

## 2023-06-16 DIAGNOSIS — Z886 Allergy status to analgesic agent status: Secondary | ICD-10-CM

## 2023-06-16 DIAGNOSIS — Z809 Family history of malignant neoplasm, unspecified: Secondary | ICD-10-CM

## 2023-06-16 DIAGNOSIS — K8050 Calculus of bile duct without cholangitis or cholecystitis without obstruction: Secondary | ICD-10-CM

## 2023-06-16 DIAGNOSIS — I5033 Acute on chronic diastolic (congestive) heart failure: Secondary | ICD-10-CM | POA: Diagnosis not present

## 2023-06-16 DIAGNOSIS — Z9049 Acquired absence of other specified parts of digestive tract: Secondary | ICD-10-CM | POA: Diagnosis not present

## 2023-06-16 DIAGNOSIS — R42 Dizziness and giddiness: Secondary | ICD-10-CM | POA: Diagnosis not present

## 2023-06-16 DIAGNOSIS — M1990 Unspecified osteoarthritis, unspecified site: Secondary | ICD-10-CM | POA: Diagnosis present

## 2023-06-16 DIAGNOSIS — R634 Abnormal weight loss: Secondary | ICD-10-CM | POA: Diagnosis not present

## 2023-06-16 DIAGNOSIS — K5730 Diverticulosis of large intestine without perforation or abscess without bleeding: Secondary | ICD-10-CM | POA: Diagnosis not present

## 2023-06-16 DIAGNOSIS — G8929 Other chronic pain: Secondary | ICD-10-CM | POA: Diagnosis present

## 2023-06-16 DIAGNOSIS — Z9689 Presence of other specified functional implants: Secondary | ICD-10-CM | POA: Diagnosis not present

## 2023-06-16 DIAGNOSIS — D500 Iron deficiency anemia secondary to blood loss (chronic): Secondary | ICD-10-CM | POA: Diagnosis not present

## 2023-06-16 DIAGNOSIS — K2971 Gastritis, unspecified, with bleeding: Secondary | ICD-10-CM | POA: Diagnosis not present

## 2023-06-16 DIAGNOSIS — K259 Gastric ulcer, unspecified as acute or chronic, without hemorrhage or perforation: Secondary | ICD-10-CM | POA: Diagnosis not present

## 2023-06-16 DIAGNOSIS — K8051 Calculus of bile duct without cholangitis or cholecystitis with obstruction: Secondary | ICD-10-CM | POA: Diagnosis not present

## 2023-06-16 DIAGNOSIS — R001 Bradycardia, unspecified: Secondary | ICD-10-CM | POA: Diagnosis not present

## 2023-06-16 DIAGNOSIS — Z79899 Other long term (current) drug therapy: Secondary | ICD-10-CM

## 2023-06-16 DIAGNOSIS — N179 Acute kidney failure, unspecified: Secondary | ICD-10-CM | POA: Diagnosis present

## 2023-06-16 DIAGNOSIS — I342 Nonrheumatic mitral (valve) stenosis: Secondary | ICD-10-CM | POA: Diagnosis not present

## 2023-06-16 DIAGNOSIS — N184 Chronic kidney disease, stage 4 (severe): Secondary | ICD-10-CM | POA: Diagnosis present

## 2023-06-16 DIAGNOSIS — R0902 Hypoxemia: Secondary | ICD-10-CM | POA: Diagnosis not present

## 2023-06-16 DIAGNOSIS — K838 Other specified diseases of biliary tract: Secondary | ICD-10-CM | POA: Diagnosis not present

## 2023-06-16 DIAGNOSIS — J9811 Atelectasis: Secondary | ICD-10-CM | POA: Diagnosis not present

## 2023-06-16 DIAGNOSIS — I2510 Atherosclerotic heart disease of native coronary artery without angina pectoris: Secondary | ICD-10-CM | POA: Diagnosis present

## 2023-06-16 DIAGNOSIS — M79671 Pain in right foot: Secondary | ICD-10-CM | POA: Diagnosis not present

## 2023-06-16 DIAGNOSIS — T68XXXA Hypothermia, initial encounter: Secondary | ICD-10-CM | POA: Diagnosis not present

## 2023-06-16 DIAGNOSIS — K660 Peritoneal adhesions (postprocedural) (postinfection): Secondary | ICD-10-CM | POA: Diagnosis present

## 2023-06-16 DIAGNOSIS — I351 Nonrheumatic aortic (valve) insufficiency: Secondary | ICD-10-CM | POA: Diagnosis not present

## 2023-06-16 DIAGNOSIS — Z90710 Acquired absence of both cervix and uterus: Secondary | ICD-10-CM

## 2023-06-16 DIAGNOSIS — I129 Hypertensive chronic kidney disease with stage 1 through stage 4 chronic kidney disease, or unspecified chronic kidney disease: Secondary | ICD-10-CM | POA: Diagnosis present

## 2023-06-16 DIAGNOSIS — Z01818 Encounter for other preprocedural examination: Secondary | ICD-10-CM | POA: Diagnosis not present

## 2023-06-16 DIAGNOSIS — D649 Anemia, unspecified: Secondary | ICD-10-CM

## 2023-06-16 DIAGNOSIS — K6389 Other specified diseases of intestine: Secondary | ICD-10-CM | POA: Diagnosis not present

## 2023-06-16 DIAGNOSIS — Z7982 Long term (current) use of aspirin: Secondary | ICD-10-CM

## 2023-06-16 DIAGNOSIS — Z882 Allergy status to sulfonamides status: Secondary | ICD-10-CM

## 2023-06-16 DIAGNOSIS — Z885 Allergy status to narcotic agent status: Secondary | ICD-10-CM

## 2023-06-16 HISTORY — DX: Benign lipomatous neoplasm of other sites: D17.79

## 2023-06-16 HISTORY — DX: Chronic kidney disease, unspecified: N18.9

## 2023-06-16 HISTORY — DX: Atherosclerotic heart disease of native coronary artery without angina pectoris: I25.10

## 2023-06-16 LAB — CBC WITH DIFFERENTIAL/PLATELET
Abs Immature Granulocytes: 0.07 10*3/uL (ref 0.00–0.07)
Basophils Absolute: 0 10*3/uL (ref 0.0–0.1)
Basophils Relative: 0 %
Eosinophils Absolute: 0.1 10*3/uL (ref 0.0–0.5)
Eosinophils Relative: 0 %
HCT: 34.1 % — ABNORMAL LOW (ref 36.0–46.0)
Hemoglobin: 10.9 g/dL — ABNORMAL LOW (ref 12.0–15.0)
Immature Granulocytes: 1 %
Lymphocytes Relative: 14 %
Lymphs Abs: 1.9 10*3/uL (ref 0.7–4.0)
MCH: 30.7 pg (ref 26.0–34.0)
MCHC: 32 g/dL (ref 30.0–36.0)
MCV: 96.1 fL (ref 80.0–100.0)
Monocytes Absolute: 1.1 10*3/uL — ABNORMAL HIGH (ref 0.1–1.0)
Monocytes Relative: 8 %
Neutro Abs: 10.6 10*3/uL — ABNORMAL HIGH (ref 1.7–7.7)
Neutrophils Relative %: 77 %
Platelets: 222 10*3/uL (ref 150–400)
RBC: 3.55 MIL/uL — ABNORMAL LOW (ref 3.87–5.11)
RDW: 15 % (ref 11.5–15.5)
WBC: 13.7 10*3/uL — ABNORMAL HIGH (ref 4.0–10.5)
nRBC: 0 % (ref 0.0–0.2)

## 2023-06-16 LAB — URINALYSIS, W/ REFLEX TO CULTURE (INFECTION SUSPECTED)
Bilirubin Urine: NEGATIVE
Glucose, UA: NEGATIVE mg/dL
Ketones, ur: NEGATIVE mg/dL
Nitrite: NEGATIVE
Protein, ur: 30 mg/dL — AB
Specific Gravity, Urine: 1.008 (ref 1.005–1.030)
WBC, UA: >50 WBC/hpf (ref 0–5)
pH: 5 (ref 5.0–8.0)

## 2023-06-16 LAB — COMPREHENSIVE METABOLIC PANEL
ALT: 14 U/L (ref 0–44)
AST: 15 U/L (ref 15–41)
Albumin: 3.5 g/dL (ref 3.5–5.0)
Alkaline Phosphatase: 65 U/L (ref 38–126)
Anion gap: 11 (ref 5–15)
BUN: 59 mg/dL — ABNORMAL HIGH (ref 8–23)
CO2: 16 mmol/L — ABNORMAL LOW (ref 22–32)
Calcium: 8.8 mg/dL — ABNORMAL LOW (ref 8.9–10.3)
Chloride: 97 mmol/L — ABNORMAL LOW (ref 98–111)
Creatinine, Ser: 3.45 mg/dL — ABNORMAL HIGH (ref 0.44–1.00)
GFR, Estimated: 12 mL/min — ABNORMAL LOW (ref >60)
Glucose, Bld: 123 mg/dL — ABNORMAL HIGH (ref 70–99)
Potassium: 5.5 mmol/L — ABNORMAL HIGH (ref 3.5–5.1)
Sodium: 124 mmol/L — ABNORMAL LOW (ref 135–145)
Total Bilirubin: 0.6 mg/dL (ref 0.3–1.2)
Total Protein: 7.4 g/dL (ref 6.5–8.1)

## 2023-06-16 LAB — POC OCCULT BLOOD, ED: Fecal Occult Bld: POSITIVE — AB

## 2023-06-16 MED ORDER — SODIUM ZIRCONIUM CYCLOSILICATE 10 G PO PACK
10.0000 g | PACK | Freq: Once | ORAL | Status: AC
  Administered 2023-06-16: 10 g via ORAL
  Filled 2023-06-16: qty 1

## 2023-06-16 MED ORDER — PANTOPRAZOLE SODIUM 40 MG IV SOLR
40.0000 mg | Freq: Once | INTRAVENOUS | Status: AC
  Administered 2023-06-17: 40 mg via INTRAVENOUS
  Filled 2023-06-16: qty 10

## 2023-06-16 MED ORDER — SODIUM CHLORIDE 0.9 % IV SOLN
1.0000 g | Freq: Once | INTRAVENOUS | Status: AC
  Administered 2023-06-17: 1 g via INTRAVENOUS
  Filled 2023-06-16: qty 10

## 2023-06-16 MED ORDER — FENTANYL CITRATE PF 50 MCG/ML IJ SOSY
50.0000 ug | PREFILLED_SYRINGE | Freq: Once | INTRAMUSCULAR | Status: AC
  Administered 2023-06-16: 50 ug via INTRAVENOUS
  Filled 2023-06-16: qty 1

## 2023-06-16 MED ORDER — LACTATED RINGERS IV BOLUS
1000.0000 mL | Freq: Once | INTRAVENOUS | Status: AC
  Administered 2023-06-16: 1000 mL via INTRAVENOUS

## 2023-06-16 NOTE — ED Triage Notes (Signed)
Patient presents due to MD request for fluids due to a worsening Creatinine level of 3.5.

## 2023-06-16 NOTE — ED Provider Notes (Addendum)
[place] [facility]ERGENCY DEPARTMENT AT [facility] Provider Note   CSN: [id number] Arrival date & time: 06/16/23  1517     History  Chief Complaint  Patient presents with   Abnormal Lab    Amy [name] is a 87 y.o. female.   Abnormal Lab    87 year old female with medical history significant for HTN, HLD presenting to the emergency department with acute renal failure.  The patient presents at the urging of her PCP [facility] for worsening serum creatinine send 8/5.  She had been on hydrochlorothiazide which had been held.  Per outpatient notes and the patient, she had had an episode of acute gastritis with generalized weakness, poor appetite, vomiting and epigastric pain and on 8/5 her creatinine was 3.4 with a BUN of 69 and a K of 5.6.  She was also found to be acutely anemic with a hemoglobin of 10.9 from a baseline of 13.  Her hydrochlorothiazide after that PCP visit was stopped.  She presented back to her PCP who urged her to present to the emergency department for fluid resuscitation.  She denies any melena or hematochezia.  She additionally endorses generalized abdominal discomfort.  Home Medications Prior to Admission medications   Medication Sig Start Date End Date Taking? Authorizing Provider  amLODipine (NORVASC) 10 MG tablet Take 10 mg by mouth daily.   Yes [provider]  aspirin 81 MG tablet Take 81 mg by mouth daily.   Yes [provider]  atenolol (TENORMIN) 50 MG tablet Take 50 mg by mouth daily.   Yes [provider]  CALCIUM-VITAMIN D PO Take 1 tablet by mouth daily.   Yes [provider]  cholecalciferol (VITAMIN D) 400 units TABS tablet Take 400 Units by mouth daily.   Yes [provider]  cyclobenzaprine (FLEXERIL) 10 MG tablet Take 10 mg by mouth daily as needed for muscle spasms. 06/12/17  Yes [provider]  diclofenac sodium (VOLTAREN) 1 % GEL Apply 2 g topically daily as needed  (pain). 03/15/18  Yes [provider]  famotidine (PEPCID) 20 MG tablet Take 20 mg by mouth daily.   Yes [provider]  fluticasone (FLONASE) 50 MCG/ACT nasal spray Place 2 sprays into both nostrils at bedtime.   Yes [provider]  HYDROcodone-acetaminophen (NORCO/VICODIN) 5-325 MG tablet Take 1 tablet by mouth every 6 (six) hours as needed for moderate pain.    Yes [provider]  loratadine (CLARITIN) 10 MG tablet Take 10 mg by mouth daily.   Yes [provider]  meclizine (ANTIVERT) 25 MG tablet Take 25 mg by mouth 3 (three) times daily as needed for dizziness.   Yes [provider]  Multiple Vitamin (MULTIVITAMIN WITH MINERALS) TABS tablet Take 1 tablet by mouth daily.   Yes [provider]  olmesartan (BENICAR) 20 MG tablet Take 20 mg by mouth daily.   Yes [provider]  pantoprazole (PROTONIX) 40 MG tablet Take 1 tablet (40 mg total) by mouth daily. Patient needs office visit for further refills Patient taking differently: Take 40 mg by mouth 2 (two) times daily. Patient needs office visit for further refills 12/18/19  Yes [name], MD  vitamin B-12 (CYANOCOBALAMIN) 1000 MCG tablet Take 1,000 mcg by mouth daily.    Yes [provider]  vitamin E 400 UNIT capsule Take 400 Units by mouth daily.   Yes [provider]  nitroGLYCERIN (NITROSTAT) 0.4 MG SL tablet Place 0.4 mg under  the tongue every 5 (five) minutes as needed for chest pain.     [provider]      Allergies    Codeine, Oxaprozin, and Sulfonamide derivatives    Review of Systems   Review of Systems  Gastrointestinal:  Positive for abdominal pain, nausea and vomiting.  All other systems reviewed and are negative.   Physical Exam Updated Vital Signs BP (!) 174/62   Pulse 74   Temp 97.7 F (36.5 C)   Resp 18   SpO2 99%  Physical Exam Vitals and nursing note reviewed.  Constitutional:      General: She is not in  acute distress.    Appearance: She is well-developed.  HENT:     Head: Normocephalic and atraumatic.  Eyes:     Conjunctiva/sclera: Conjunctivae normal.  Cardiovascular:     Rate and Rhythm: Normal rate and regular rhythm.  Pulmonary:     Effort: Pulmonary effort is normal. No respiratory distress.     Breath sounds: Normal breath sounds.  Abdominal:     Palpations: Abdomen is soft.     Tenderness: There is abdominal tenderness. There is no guarding or rebound.  Genitourinary:    Comments: No melena or hematochezia, fecal occult positive Musculoskeletal:        General: No swelling.     Cervical back: Neck supple.  Skin:    General: Skin is warm and dry.     Capillary Refill: Capillary refill takes less than 2 seconds.  Neurological:     General: No focal deficit present.     Mental Status: She is alert. Mental status is at baseline.  Psychiatric:        Mood and Affect: Mood normal.     ED Results / Procedures / Treatments   Labs (all labs ordered are listed, but only abnormal results are displayed) Labs Reviewed  CBC WITH DIFFERENTIAL/PLATELET - Abnormal; Notable for the following components:      Result Value   WBC 13.7 (*)    RBC 3.55 (*)    Hemoglobin 10.9 (*)    HCT 34.1 (*)    Neutro Abs 10.6 (*)    Monocytes Absolute 1.1 (*)    All other components within normal limits  COMPREHENSIVE METABOLIC PANEL - Abnormal; Notable for the following components:   Sodium 124 (*)    Potassium 5.5 (*)    Chloride 97 (*)    CO2 16 (*)    Glucose, Bld 123 (*)    BUN 59 (*)    Creatinine, Ser 3.45 (*)    Calcium 8.8 (*)    GFR, Estimated 12 (*)    All other components within normal limits  URINALYSIS, W/ REFLEX TO CULTURE (INFECTION SUSPECTED) - Abnormal; Notable for the following components:   APPearance CLOUDY (*)    Hgb urine dipstick MODERATE (*)    Protein, ur 30 (*)    Leukocytes,Ua LARGE (*)    Bacteria, UA FEW (*)    All other components within normal limits   POC OCCULT BLOOD, ED - Abnormal; Notable for the following components:   Fecal Occult Bld POSITIVE (*)    All other components within normal limits  CK    EKG None  Radiology CT ABDOMEN PELVIS WO CONTRAST  Result Date: 06/16/2023 CLINICAL DATA:  Abdominal pain, acute, nonlocalized EXAM: CT ABDOMEN AND PELVIS WITHOUT CONTRAST TECHNIQUE: Multidetector CT imaging of the abdomen and pelvis was performed following the standard protocol without IV contrast. RADIATION DOSE  REDUCTION: This exam was performed according to the departmental dose-optimization program which includes automated exposure control, adjustment of the mA and/or kV according to patient size and/or use of iterative reconstruction technique. COMPARISON:  CT abdomen pelvis 08/07/2020, CT abdomen pelvis 02/13/2017 FINDINGS: Lower chest: Trace hiatal hernia.  No acute abnormality. Hepatobiliary: No focal liver abnormality. Calcified gallstone noted within the gallbladder lumen. Persistent several calcified gallstones within the common bile duct with interval increase in size of an enlarged common bile duct measuring up to 16 mm. No gallbladder wall thickening or pericholecystic fluid. Limited evaluation of the intrahepatic biliary ducts on this noncontrast study. Pancreas: Diffusely atrophic. No focal lesion. Otherwise normal pancreatic contour. No surrounding inflammatory changes. No main pancreatic ductal dilatation. Spleen: Normal in size without focal abnormality.  Splenule noted. Adrenals/Urinary Tract: Status post right adrenalectomy. Coarse calcifications of the left adrenal gland likely sequelae of prior infection/hemorrhage-chronic no further follow-up indicated. No nephrolithiasis and no hydronephrosis. Bilateral renal cortical scarring. Fluid density lesions within the kidneys likely represent simple renal cysts. Chronic 1.1 cm hyperdense right renal lesion with a density of 79 Hounsfield units likely hemorrhagic or proteinaceous  cyst-no further follow-up indicated. No ureterolithiasis or hydroureter. The urinary bladder is unremarkable. Stomach/Bowel: Stomach is within normal limits. No evidence of bowel wall thickening or dilatation. Colonic diverticulosis. Appendix appears normal. Vascular/Lymphatic: No abdominal aorta or iliac aneurysm. Severe atherosclerotic plaque of the aorta and its branches. No abdominal, pelvic, or inguinal lymphadenopathy. Reproductive: Status post hysterectomy. No adnexal masses. Other: No intraperitoneal free fluid. No intraperitoneal free gas. No organized fluid collection. Musculoskeletal: No abdominal wall hernia or abnormality. No suspicious lytic or blastic osseous lesions. No acute displaced fracture. 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 Mild retrolisthesis of L1 on L2 and L2 on L3. IMPRESSION: 1. Persistent cholelithiasis and persistent choledocholithiasis with slightly increased in size common bile duct dilatation. No CT finding of acute cholecystitis. Limited evaluation on this noncontrast study. 2. 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 3. Trace hiatal hernia. Electronically Signed   By: Tish Frederickson M.D.   On: 06/16/2023 22:37    Procedures Procedures    Medications Ordered in ED Medications  cefTRIAXone (ROCEPHIN) 1 g in sodium chloride 0.9 % 100 mL IVPB (has no administration in time range)  pantoprazole (PROTONIX) injection 40 mg (has no administration in time range)  lactated ringers bolus 1,000 mL (0 mLs Intravenous Stopped 06/16/23 2227)  fentaNYL (SUBLIMAZE) injection 50 mcg (50 mcg Intravenous Given 06/16/23 2043)  sodium zirconium cyclosilicate (LOKELMA) packet 10 g (10 g Oral Given 06/16/23 2227)    ED Course/ Medical Decision Making/ A&P Clinical Course as of 06/16/23 2347  Fri Jun 16, 2023  2131 Sodium(!): 124 [JL]  2131 Potassium(!): 5.5 [JL]  2131 CO2(!): 16 [JL]  2131 Creatinine(!): 3.45 [JL]  2131 BUN(!): 59 [JL]  2341 Fecal Occult Blood, POC(!): POSITIVE [JL]    Clinical Course User Index [JL] Ernie Avena, MD                                 Medical Decision Making Amount and/or Complexity of Data Reviewed Labs: ordered. Decision-making details documented in ED Course. Radiology: ordered.  Risk Prescription drug management. Decision regarding hospitalization.   87 year old female with medical history significant  for HTN, HLD presenting to the emergency department with acute renal failure.  The patient presents at the urging of her PCP [facility] for worsening serum creatinine send 8/5.  She had been on hydrochlorothiazide which had been held.  Per outpatient notes and the patient, she had had an episode of acute gastritis with generalized weakness, poor appetite, vomiting and epigastric pain and on 8/5 her creatinine was 3.4 with a BUN of 69 and a K of 5.6.  She was also found to be acutely anemic with a hemoglobin of 10.9 from a baseline of 13.  Her hydrochlorothiazide after that PCP visit was stopped.  She presented back to her PCP who urged her to present to the emergency department for fluid resuscitation.  She denies any melena or hematochezia.  She additionally endorses generalized abdominal discomfort.  On arrival, the patient was afebrile, not tachycardic or tachypneic, BP 143/46, saturating 100% on room air, sinus rhythm noted on cardiac telemetry.  Physical exam significant for generalized abdominal tenderness, no rebound or guarding.  Patient presenting with an AKI in the setting of what was diagnosis gastritis with her PCP.  She denies any fevers, chills.  She also has had a hemoglobin drop to 10.9 from baseline of 13.  Denies any melena or hematochezia.  Rectal exam was performed with no melena or hematochezia, was fecal occult  positive.  IV access was obtained and the patient was administered a 1 L LR bolus.  She was administered Lokelma for mildly elevated potassium to 5.5.  She was found to be hyponatremic to 124 which is also acute in the setting of her diuretic use.  Her creatinine was found to be 3.45 with an elevated BUN to 59.  Urinalysis revealed evidence of UTI with large leukocytes, greater than 50 WBCs and bacteria present.  Patient was administered IV fentanyl for pain control in addition to IV Rocephin.  CT abdomen pelvis was performed given the patient's abdominal discomfort: IMPRESSION:  1. Persistent cholelithiasis and persistent choledocholithiasis with  slightly increased in size common bile duct dilatation. No CT  finding of acute cholecystitis. Limited evaluation on this  noncontrast study.  2. Multilevel severe degenerative changes of the spine with  impacted/fused L4-L5 levels and associated grade 2 anterolisthesis  of L4 on L5.  3. Trace hiatal hernia.    Unclear if the choledocholithiasis is causing the patient's abdominal discomfort versus symptoms associated with cystitis and UTI.  No other abnormalities noted on CT imaging beyond severe degenerative disc disease in the lumbar spine.  Gastroenterology was consulted due to the finding of choledocholithiasis.  She shows no evidence of biliary obstruction on her labs.  She does have a dilated CBD on CT with gallstones present both in the gallbladder and in the bile duct.  Spoke with Dr. Ewing Schlein of [facility] who will see the patient in consultation.  Hospitalist medicine was consulted for admission in the setting of AKI and multiple other diagnoses as listed below, Dr. Cyndia Bent accepted the patient in admission.   Final Clinical Impression(s) / ED Diagnoses Final diagnoses:  Hyponatremia  AKI (acute kidney injury) (HCC)  Calculus of gallbladder without cholecystitis without obstruction  Choledocholithiasis  Anemia, unspecified type  Abdominal  pain, unspecified abdominal location  Acute cystitis with hematuria    Rx / DC Orders ED Discharge Orders     None         Ernie Avena, MD 06/16/23 2337    Ernie Avena, MD 06/16/23 2347

## 2023-06-17 ENCOUNTER — Encounter (HOSPITAL_COMMUNITY): Payer: Self-pay | Admitting: Family Medicine

## 2023-06-17 DIAGNOSIS — E872 Acidosis, unspecified: Secondary | ICD-10-CM

## 2023-06-17 DIAGNOSIS — K8020 Calculus of gallbladder without cholecystitis without obstruction: Secondary | ICD-10-CM | POA: Diagnosis not present

## 2023-06-17 DIAGNOSIS — K8045 Calculus of bile duct with chronic cholecystitis with obstruction: Secondary | ICD-10-CM | POA: Diagnosis present

## 2023-06-17 DIAGNOSIS — Z9049 Acquired absence of other specified parts of digestive tract: Secondary | ICD-10-CM | POA: Diagnosis not present

## 2023-06-17 DIAGNOSIS — R634 Abnormal weight loss: Secondary | ICD-10-CM | POA: Diagnosis not present

## 2023-06-17 DIAGNOSIS — E876 Hypokalemia: Secondary | ICD-10-CM | POA: Diagnosis not present

## 2023-06-17 DIAGNOSIS — N179 Acute kidney failure, unspecified: Secondary | ICD-10-CM | POA: Diagnosis present

## 2023-06-17 DIAGNOSIS — G8929 Other chronic pain: Secondary | ICD-10-CM | POA: Diagnosis present

## 2023-06-17 DIAGNOSIS — K8051 Calculus of bile duct without cholangitis or cholecystitis with obstruction: Secondary | ICD-10-CM | POA: Diagnosis not present

## 2023-06-17 DIAGNOSIS — E875 Hyperkalemia: Secondary | ICD-10-CM

## 2023-06-17 DIAGNOSIS — Z66 Do not resuscitate: Secondary | ICD-10-CM | POA: Diagnosis not present

## 2023-06-17 DIAGNOSIS — Z87891 Personal history of nicotine dependence: Secondary | ICD-10-CM | POA: Diagnosis not present

## 2023-06-17 DIAGNOSIS — M79604 Pain in right leg: Secondary | ICD-10-CM | POA: Diagnosis not present

## 2023-06-17 DIAGNOSIS — G4700 Insomnia, unspecified: Secondary | ICD-10-CM | POA: Diagnosis not present

## 2023-06-17 DIAGNOSIS — E7800 Pure hypercholesterolemia, unspecified: Secondary | ICD-10-CM | POA: Diagnosis present

## 2023-06-17 DIAGNOSIS — K8070 Calculus of gallbladder and bile duct without cholecystitis without obstruction: Secondary | ICD-10-CM | POA: Diagnosis not present

## 2023-06-17 DIAGNOSIS — T68XXXA Hypothermia, initial encounter: Secondary | ICD-10-CM | POA: Diagnosis not present

## 2023-06-17 DIAGNOSIS — M4316 Spondylolisthesis, lumbar region: Secondary | ICD-10-CM | POA: Diagnosis present

## 2023-06-17 DIAGNOSIS — I7143 Infrarenal abdominal aortic aneurysm, without rupture: Secondary | ICD-10-CM | POA: Diagnosis not present

## 2023-06-17 DIAGNOSIS — K8050 Calculus of bile duct without cholangitis or cholecystitis without obstruction: Secondary | ICD-10-CM | POA: Diagnosis present

## 2023-06-17 DIAGNOSIS — N189 Chronic kidney disease, unspecified: Secondary | ICD-10-CM | POA: Diagnosis not present

## 2023-06-17 DIAGNOSIS — F17210 Nicotine dependence, cigarettes, uncomplicated: Secondary | ICD-10-CM | POA: Diagnosis present

## 2023-06-17 DIAGNOSIS — N184 Chronic kidney disease, stage 4 (severe): Secondary | ICD-10-CM | POA: Diagnosis present

## 2023-06-17 DIAGNOSIS — R195 Other fecal abnormalities: Secondary | ICD-10-CM

## 2023-06-17 DIAGNOSIS — R630 Anorexia: Secondary | ICD-10-CM | POA: Diagnosis present

## 2023-06-17 DIAGNOSIS — D509 Iron deficiency anemia, unspecified: Secondary | ICD-10-CM | POA: Diagnosis present

## 2023-06-17 DIAGNOSIS — D649 Anemia, unspecified: Secondary | ICD-10-CM | POA: Diagnosis not present

## 2023-06-17 DIAGNOSIS — K449 Diaphragmatic hernia without obstruction or gangrene: Secondary | ICD-10-CM | POA: Diagnosis not present

## 2023-06-17 DIAGNOSIS — N3001 Acute cystitis with hematuria: Secondary | ICD-10-CM | POA: Diagnosis not present

## 2023-06-17 DIAGNOSIS — J90 Pleural effusion, not elsewhere classified: Secondary | ICD-10-CM | POA: Diagnosis not present

## 2023-06-17 DIAGNOSIS — D500 Iron deficiency anemia secondary to blood loss (chronic): Secondary | ICD-10-CM | POA: Diagnosis not present

## 2023-06-17 DIAGNOSIS — K259 Gastric ulcer, unspecified as acute or chronic, without hemorrhage or perforation: Secondary | ICD-10-CM | POA: Diagnosis not present

## 2023-06-17 DIAGNOSIS — K2970 Gastritis, unspecified, without bleeding: Secondary | ICD-10-CM | POA: Diagnosis not present

## 2023-06-17 DIAGNOSIS — E871 Hypo-osmolality and hyponatremia: Secondary | ICD-10-CM

## 2023-06-17 DIAGNOSIS — I351 Nonrheumatic aortic (valve) insufficiency: Secondary | ICD-10-CM | POA: Diagnosis not present

## 2023-06-17 DIAGNOSIS — K5900 Constipation, unspecified: Secondary | ICD-10-CM | POA: Diagnosis present

## 2023-06-17 DIAGNOSIS — E538 Deficiency of other specified B group vitamins: Secondary | ICD-10-CM | POA: Diagnosis present

## 2023-06-17 DIAGNOSIS — E860 Dehydration: Secondary | ICD-10-CM | POA: Diagnosis present

## 2023-06-17 DIAGNOSIS — I342 Nonrheumatic mitral (valve) stenosis: Secondary | ICD-10-CM | POA: Diagnosis not present

## 2023-06-17 DIAGNOSIS — I2510 Atherosclerotic heart disease of native coronary artery without angina pectoris: Secondary | ICD-10-CM | POA: Diagnosis present

## 2023-06-17 DIAGNOSIS — K219 Gastro-esophageal reflux disease without esophagitis: Secondary | ICD-10-CM | POA: Diagnosis present

## 2023-06-17 DIAGNOSIS — K274 Chronic or unspecified peptic ulcer, site unspecified, with hemorrhage: Secondary | ICD-10-CM | POA: Diagnosis present

## 2023-06-17 DIAGNOSIS — J9601 Acute respiratory failure with hypoxia: Secondary | ICD-10-CM | POA: Diagnosis not present

## 2023-06-17 DIAGNOSIS — I129 Hypertensive chronic kidney disease with stage 1 through stage 4 chronic kidney disease, or unspecified chronic kidney disease: Secondary | ICD-10-CM | POA: Diagnosis present

## 2023-06-17 DIAGNOSIS — R109 Unspecified abdominal pain: Secondary | ICD-10-CM | POA: Diagnosis not present

## 2023-06-17 DIAGNOSIS — I10 Essential (primary) hypertension: Secondary | ICD-10-CM | POA: Diagnosis not present

## 2023-06-17 DIAGNOSIS — K2971 Gastritis, unspecified, with bleeding: Secondary | ICD-10-CM

## 2023-06-17 DIAGNOSIS — I5033 Acute on chronic diastolic (congestive) heart failure: Secondary | ICD-10-CM | POA: Diagnosis not present

## 2023-06-17 DIAGNOSIS — K2940 Chronic atrophic gastritis without bleeding: Secondary | ICD-10-CM | POA: Diagnosis present

## 2023-06-17 LAB — IRON AND TIBC
Iron: 62 ug/dL (ref 28–170)
Saturation Ratios: 28 % (ref 10.4–31.8)
TIBC: 220 ug/dL — ABNORMAL LOW (ref 250–450)
UIBC: 158 ug/dL

## 2023-06-17 LAB — BASIC METABOLIC PANEL
Anion gap: 16 — ABNORMAL HIGH (ref 5–15)
BUN: 49 mg/dL — ABNORMAL HIGH (ref 8–23)
CO2: 14 mmol/L — ABNORMAL LOW (ref 22–32)
Calcium: 8.3 mg/dL — ABNORMAL LOW (ref 8.9–10.3)
Chloride: 97 mmol/L — ABNORMAL LOW (ref 98–111)
Creatinine, Ser: 3.27 mg/dL — ABNORMAL HIGH (ref 0.44–1.00)
GFR, Estimated: 13 mL/min — ABNORMAL LOW (ref >60)
Glucose, Bld: 108 mg/dL — ABNORMAL HIGH (ref 70–99)
Potassium: 4.8 mmol/L (ref 3.5–5.1)
Sodium: 127 mmol/L — ABNORMAL LOW (ref 135–145)

## 2023-06-17 LAB — OSMOLALITY, URINE: Osmolality, Ur: 225 mOsm/kg — ABNORMAL LOW (ref 300–900)

## 2023-06-17 LAB — FERRITIN: Ferritin: 260 ng/mL (ref 11–307)

## 2023-06-17 LAB — VITAMIN B12: Vitamin B-12: 459 pg/mL (ref 180–914)

## 2023-06-17 LAB — HEMOGLOBIN AND HEMATOCRIT, BLOOD
HCT: 29.4 % — ABNORMAL LOW (ref 36.0–46.0)
Hemoglobin: 9.6 g/dL — ABNORMAL LOW (ref 12.0–15.0)

## 2023-06-17 LAB — FOLATE: Folate: 4.8 ng/mL — ABNORMAL LOW (ref >5.9)

## 2023-06-17 LAB — SODIUM, URINE, RANDOM: Sodium, Ur: 37 mmol/L

## 2023-06-17 MED ORDER — PANTOPRAZOLE SODIUM 40 MG IV SOLR
40.0000 mg | Freq: Two times a day (BID) | INTRAVENOUS | Status: DC
  Administered 2023-06-17 – 2023-06-19 (×5): 40 mg via INTRAVENOUS
  Filled 2023-06-17 (×5): qty 10

## 2023-06-17 MED ORDER — AMLODIPINE BESYLATE 10 MG PO TABS
10.0000 mg | ORAL_TABLET | Freq: Every day | ORAL | Status: DC
  Administered 2023-06-17 – 2023-06-19 (×3): 10 mg via ORAL
  Filled 2023-06-17 (×4): qty 1

## 2023-06-17 MED ORDER — SODIUM CHLORIDE 0.9 % IV SOLN: INTRAVENOUS | Status: DC

## 2023-06-17 MED ORDER — BISACODYL 5 MG PO TBEC
10.0000 mg | DELAYED_RELEASE_TABLET | Freq: Once | ORAL | Status: AC
  Administered 2023-06-17: 10 mg via ORAL
  Filled 2023-06-17: qty 2

## 2023-06-17 MED ORDER — ACETAMINOPHEN 325 MG PO TABS
650.0000 mg | ORAL_TABLET | Freq: Four times a day (QID) | ORAL | Status: DC | PRN
  Administered 2023-06-17 – 2023-06-20 (×3): 650 mg via ORAL
  Filled 2023-06-17 (×3): qty 2

## 2023-06-17 MED ORDER — ACETAMINOPHEN 650 MG RE SUPP: 650.0000 mg | Freq: Four times a day (QID) | RECTAL | Status: DC | PRN

## 2023-06-17 MED ORDER — ATENOLOL 50 MG PO TABS
50.0000 mg | ORAL_TABLET | Freq: Every day | ORAL | Status: DC
  Administered 2023-06-17 – 2023-06-19 (×3): 50 mg via ORAL
  Filled 2023-06-17 (×4): qty 1

## 2023-06-17 MED ORDER — TRAMADOL HCL 50 MG PO TABS
50.0000 mg | ORAL_TABLET | Freq: Four times a day (QID) | ORAL | Status: DC | PRN
  Administered 2023-06-17 – 2023-06-18 (×2): 50 mg via ORAL
  Filled 2023-06-17 (×2): qty 1

## 2023-06-17 MED ORDER — FOSFOMYCIN TROMETHAMINE 3 G PO PACK
3.0000 g | PACK | Freq: Once | ORAL | Status: AC
  Administered 2023-06-17: 3 g via ORAL
  Filled 2023-06-17: qty 3

## 2023-06-17 MED ORDER — CYCLOBENZAPRINE HCL 10 MG PO TABS
10.0000 mg | ORAL_TABLET | Freq: Every day | ORAL | Status: DC | PRN
  Administered 2023-06-17: 10 mg via ORAL
  Filled 2023-06-17: qty 1

## 2023-06-17 NOTE — ED Notes (Signed)
ED TO INPATIENT HANDOFF REPORT  ED Nurse Name and Phone #: [phone]  S Name/Age/Gender Amy [name] 87 y.o. female Room/Bed: WA14/WA14  Code Status   Code Status: Prior  Home/SNF/Other Home Patient oriented to: self, place, time, and situation Is this baseline? Yes   Triage Complete: Triage complete  Chief Complaint Acute kidney injury [facility]) [N17.9]  Triage Note Patient presents due to MD request for fluids due to a worsening Creatinine level of 3.5.    Allergies Allergies  Allergen Reactions   Codeine Nausea And Vomiting    REACTION: nausea/vomiting   Oxaprozin     Other Reaction(s): Unknown   Sulfonamide Derivatives Itching and Rash    Level of Care/Admitting Diagnosis ED Disposition     ED Disposition  Admit   Condition  --   Comment  Hospital [facility] [[id number]]  Level of Care: Telemetry Medical [104]  May admit patient to [facility] or [facility] if equivalent level of care is available:: No  Covid Evaluation: Confirmed COVID Negative  Diagnosis: Acute kidney injury [facility]) [[id number]]  Admitting Physician: Gery Pray [[id number]]  Attending Physician: Gery Pray [[id number]]  Certification:: I certify this patient will need inpatient services for at least 2 midnights  Estimated Length of Stay: 2          B Medical/Surgery History Past Medical History:  Diagnosis Date   Arthritis    HTN (hypertension)    Hypercholesterolemia    Past Surgical History:  Procedure Laterality Date   BACK SURGERY     CARDIAC CATHETERIZATION  01-02-2009   The anterior descending artery is moderate  in size. There is an eccentric 60-70% proximal stenosis followed by 50-60% mid stenosis. EF 75-80%   CARDIOVASCULAR STRESS TEST  12-26-2008   EF 65%   CATARACT EXTRACTION     COLONOSCOPY N/A 02/15/2017   Procedure: COLONOSCOPY;  Surgeon: Rachael Fee, MD;  Location: [facility] ENDOSCOPY;  Service: Endoscopy;  Laterality: N/A;   CYSTECTOMY     Right Hand    HEMORROIDECTOMY     TONSILLECTOMY     TOTAL ABDOMINAL HYSTERECTOMY       A IV Location/Drains/Wounds Patient Lines/Drains/Airways Status     Active Line/Drains/Airways     Name Placement date Placement time Site Days   Peripheral IV 04/13/17 Right;Anterior Forearm 04/13/17  1009  Forearm  2256   Peripheral IV 06/16/23 20 G Right Antecubital 06/16/23  1956  Antecubital  1            Intake/Output Last 24 hours No intake or output data in the 24 hours ending 06/17/23 0505  Labs/Imaging Results for orders placed or performed during the hospital encounter of 06/16/23 (from the past 48 hour(s))  CBC with Differential     Status: Abnormal   Collection Time: 06/16/23  7:05 PM  Result Value Ref Range   WBC 13.7 (H) 4.0 - 10.5 K/uL   RBC 3.55 (L) 3.87 - 5.11 MIL/uL   Hemoglobin 10.9 (L) 12.0 - 15.0 g/dL   HCT 56.2 (L) 13.0 - 86.5 %   MCV 96.1 80.0 - 100.0 fL   MCH 30.7 26.0 - 34.0 pg   MCHC 32.0 30.0 - 36.0 g/dL   RDW 78.4 69.6 - 29.5 %   Platelets 222 150 - 400 K/uL   nRBC 0.0 0.0 - 0.2 %   Neutrophils Relative % 77 %   Neutro Abs 10.6 (H) 1.7 - 7.7 K/uL   Lymphocytes Relative 14 %  Lymphs Abs 1.9 0.7 - 4.0 K/uL   Monocytes Relative 8 %   Monocytes Absolute 1.1 (H) 0.1 - 1.0 K/uL   Eosinophils Relative 0 %   Eosinophils Absolute 0.1 0.0 - 0.5 K/uL   Basophils Relative 0 %   Basophils Absolute 0.0 0.0 - 0.1 K/uL   Immature Granulocytes 1 %   Abs Immature Granulocytes 0.07 0.00 - 0.07 K/uL    Comment: Performed at [facility], [address] [place], [place] [address]  Comprehensive metabolic panel     Status: Abnormal   Collection Time: 06/16/23  7:05 PM  Result Value Ref Range   Sodium 124 (L) 135 - 145 mmol/L   Potassium 5.5 (H) 3.5 - 5.1 mmol/L   Chloride 97 (L) 98 - 111 mmol/L   CO2 16 (L) 22 - 32 mmol/L   Glucose, Bld 123 (H) 70 - 99 mg/dL    Comment: Glucose reference range applies only to samples taken after fasting for at least 8  hours.   BUN 59 (H) 8 - 23 mg/dL   Creatinine, Ser 1.30 (H) 0.44 - 1.00 mg/dL   Calcium 8.8 (L) 8.9 - 10.3 mg/dL   Total Protein 7.4 6.5 - 8.1 g/dL   Albumin 3.5 3.5 - 5.0 g/dL   AST 15 15 - 41 U/L   ALT 14 0 - 44 U/L   Alkaline Phosphatase 65 38 - 126 U/L   Total Bilirubin 0.6 0.3 - 1.2 mg/dL   GFR, Estimated 12 (L) >60 mL/min    Comment: (NOTE) Calculated using the CKD-EPI Creatinine Equation (2021)    Anion gap 11 5 - 15    Comment: Performed at [facility], [address], [place], [place] [address]  Urinalysis, w/ Reflex to Culture (Infection Suspected) -Urine, Clean Catch     Status: Abnormal   Collection Time: 06/16/23  9:57 PM  Result Value Ref Range   Specimen Source URINE, CLEAN CATCH    Color, Urine YELLOW YELLOW   APPearance CLOUDY (A) CLEAR   Specific Gravity, Urine 1.008 1.005 - 1.030   pH 5.0 5.0 - 8.0   Glucose, UA NEGATIVE NEGATIVE mg/dL   Hgb urine dipstick MODERATE (A) NEGATIVE   Bilirubin Urine NEGATIVE NEGATIVE   Ketones, ur NEGATIVE NEGATIVE mg/dL   Protein, ur 30 (A) NEGATIVE mg/dL   Nitrite NEGATIVE NEGATIVE   Leukocytes,Ua LARGE (A) NEGATIVE   RBC / HPF 0-5 0 - 5 RBC/hpf   WBC, UA >50 0 - 5 WBC/hpf    Comment:        Reflex urine culture not performed if WBC <=10, OR if Squamous epithelial cells >5. If Squamous epithelial cells >5 suggest recollection.    Bacteria, UA FEW (A) NONE SEEN   Squamous Epithelial / HPF 6-10 0 - 5 /HPF   WBC Clumps PRESENT    Mucus PRESENT     Comment: Performed at [facility], [address] [place], [place] [address]  POC occult blood, ED     Status: Abnormal   Collection Time: 06/16/23 11:27 PM  Result Value Ref Range   Fecal Occult Bld POSITIVE (A) NEGATIVE   CT ABDOMEN PELVIS WO CONTRAST  Result Date: 06/16/2023 CLINICAL DATA:  Abdominal pain, acute, nonlocalized EXAM: CT ABDOMEN AND PELVIS WITHOUT CONTRAST TECHNIQUE: Multidetector CT imaging of the abdomen and pelvis  was performed following the standard protocol without IV contrast. RADIATION DOSE REDUCTION: This exam was performed according to the departmental dose-optimization program  which includes automated exposure control, adjustment of the mA and/or kV according to patient size and/or use of iterative reconstruction technique. COMPARISON:  CT abdomen pelvis 08/07/2020, CT abdomen pelvis 02/13/2017 FINDINGS: Lower chest: Trace hiatal hernia.  No acute abnormality. Hepatobiliary: No focal liver abnormality. Calcified gallstone noted within the gallbladder lumen. Persistent several calcified gallstones within the common bile duct with interval increase in size of an enlarged common bile duct measuring up to 16 mm. No gallbladder wall thickening or pericholecystic fluid. Limited evaluation of the intrahepatic biliary ducts on this noncontrast study. Pancreas: Diffusely atrophic. No focal lesion. Otherwise normal pancreatic contour. No surrounding inflammatory changes. No main pancreatic ductal dilatation. Spleen: Normal in size without focal abnormality.  Splenule noted. Adrenals/Urinary Tract: Status post right adrenalectomy. Coarse calcifications of the left adrenal gland likely sequelae of prior infection/hemorrhage-chronic no further follow-up indicated. No nephrolithiasis and no hydronephrosis. Bilateral renal cortical scarring. Fluid density lesions within the kidneys likely represent simple renal cysts. Chronic 1.1 cm hyperdense right renal lesion with a density of 79 Hounsfield units likely hemorrhagic or proteinaceous cyst-no further follow-up indicated. No ureterolithiasis or hydroureter. The urinary bladder is unremarkable. Stomach/Bowel: Stomach is within normal limits. No evidence of bowel wall thickening or dilatation. Colonic diverticulosis. Appendix appears normal. Vascular/Lymphatic: No abdominal aorta or iliac aneurysm. Severe atherosclerotic plaque of the aorta and its branches. No abdominal, pelvic, or  inguinal lymphadenopathy. Reproductive: Status post hysterectomy. No adnexal masses. Other: No intraperitoneal free fluid. No intraperitoneal free gas. No organized fluid collection. Musculoskeletal: No abdominal wall hernia or abnormality. No suspicious lytic or blastic osseous lesions. No acute displaced fracture. 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 Mild retrolisthesis of L1 on L2 and L2 on L3. IMPRESSION: 1. Persistent cholelithiasis and persistent choledocholithiasis with slightly increased in size common bile duct dilatation. No CT finding of acute cholecystitis. Limited evaluation on this noncontrast study. 2. 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 3. Trace hiatal hernia. Electronically Signed   By: Tish Frederickson M.D.   On: 06/16/2023 22:37    Pending Labs Unresulted Labs (From admission, onward)     Start     Ordered   06/17/23 0342  Phosphorus  Once,   STAT        06/17/23 0341   06/17/23 0341  Osmolality  Once,   URGENT        06/17/23 0340   06/17/23 0341  Osmolality, urine  Once,   URGENT        06/17/23 0340   06/17/23 0341  TSH  Once,   URGENT        06/17/23 0340   06/17/23 0341  Basic metabolic panel  ONCE - STAT,   STAT        06/17/23 0340   06/17/23 0340  Sodium, urine, random  Once,   URGENT        06/17/23 0340   06/16/23 2037  CK  Add-on,   AD        06/16/23 2036            Vitals/Pain Today's Vitals   06/16/23 2300 06/17/23 0035 06/17/23 0203 06/17/23 0215  BP: (!) 174/62   (!) 154/65  Pulse: 74   72  Resp: 18   18  Temp:  (!) 95.3 F (35.2 C) (!) 97.4 F (36.3 C)  TempSrc:  Rectal Oral   SpO2: 99%   99%  PainSc:        Isolation Precautions No active isolations  Medications Medications  0.9 %  sodium chloride infusion (has no administration in time  range)  lactated ringers bolus 1,000 mL (0 mLs Intravenous Stopped 06/16/23 2227)  fentaNYL (SUBLIMAZE) injection 50 mcg (50 mcg Intravenous Given 06/16/23 2043)  sodium zirconium cyclosilicate (LOKELMA) packet 10 g (10 g Oral Given 06/16/23 2227)  cefTRIAXone (ROCEPHIN) 1 g in sodium chloride 0.9 % 100 mL IVPB (1 g Intravenous New Bag/Given 06/17/23 0011)  pantoprazole (PROTONIX) injection 40 mg (40 mg Intravenous Given 06/17/23 0012)    Mobility      Focused Assessments    R Recommendations: See Admitting Provider Note  Report given to:   Additional Notes:

## 2023-06-17 NOTE — ED Notes (Signed)
RN given report and [facility] has been called for transportation.

## 2023-06-17 NOTE — ED Notes (Signed)
Rectal Temp 95.3 RN aware and message sent to MD

## 2023-06-17 NOTE — Plan of Care (Signed)
  Problem: Elimination: Goal: Will not experience complications related to urinary retention Outcome: Not Progressing   Problem: Elimination: Goal: Will not experience complications related to bowel motility Outcome: Not Progressing   Problem: Safety: Goal: Ability to remain free from injury will improve Outcome: Not Progressing

## 2023-06-17 NOTE — H&P (Addendum)
PCP:   Geoffry Paradise, MD   Chief Complaint:  Elevated creatinine, weakness  HPI: This is a 87 year old female with past medical history HTN, iron deficiency anemia, GERD.  Per patient patient thought she had gastritis, she had nausea, vomiting, abdominal pain.  Pain located in epigastric area and in her lower abdomen.  She denies diarrhea.  After she was getting progressively weaker and weaker.  She saw her PCP on Monday where blood work was done.  She was called today and told to go to the ER because of her kidney results.  Patient reports normal urine output.  Her last BM was yesterday, per patient he was regular brown.  She denies heartburn but endorses a history of bleeding ulcer with remote EGD and colonoscopies. Per chart patient's most recent colonoscopy was in 2018 which showed multiple ulcers in the right colon.  Path results: The biopsies showed no evidence of cancer, IBD, ischemia.   In the ER patient vitals are stable.  Sodium 124, potassium 5.5, CO2 16, creatinine 3.45.  Most recent creatinine 1.9 done 04/11/2017.  WBCs 13.7.  Fecal occult stool positive.  Creatinine 10.9, down from 14 done 12/20/2019. CT abdomen pelvis done shows: Persistent cholelithiasis and persistent choledocholithiasis with slight increase in size of common bile duct.  No CT evidence of acute cholecystitis. 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 Patient received 1 L normal saline and looking well.  GI Dr. Ewing Schlein consulted.  Formal consult in AM.  Review of Systems:  Per HPI  Past Medical History: Past Medical History:  Diagnosis Date   Arthritis    HTN (hypertension)    Hypercholesterolemia    Past Surgical History:  Procedure Laterality Date   BACK SURGERY     CARDIAC CATHETERIZATION  01-02-2009   The anterior descending artery is moderate  in size. There is an eccentric 60-70% proximal stenosis followed  by 50-60% mid stenosis. EF 75-80%   CARDIOVASCULAR STRESS TEST  12-26-2008   EF 65%   CATARACT EXTRACTION     COLONOSCOPY N/A 02/15/2017   Procedure: COLONOSCOPY;  Surgeon: Rachael Fee, MD;  Location: [facility]COPY;  Service: Endoscopy;  Laterality: N/A;   CYSTECTOMY     Right Hand   HEMORROIDECTOMY     TONSILLECTOMY     TOTAL ABDOMINAL HYSTERECTOMY      Medications: Prior to Admission medications   Medication Sig Start Date End Date Taking? Authorizing Provider  amLODipine (NORVASC) 10 MG tablet Take 10 mg by mouth daily.   Yes [provider]  aspirin 81 MG tablet Take 81 mg by mouth daily.   Yes [provider]  atenolol (TENORMIN) 50 MG tablet Take 50 mg by mouth daily.   Yes [provider]  CALCIUM-VITAMIN D PO Take 1 tablet by mouth daily.   Yes [provider]  cholecalciferol (VITAMIN D) 400 units TABS tablet Take 400 Units by mouth daily.   Yes [provider]  cyclobenzaprine (FLEXERIL) 10 MG tablet Take 10 mg by mouth daily as needed for muscle spasms. 06/12/17  Yes [provider]  diclofenac sodium (VOLTAREN) 1 % GEL Apply 2 g topically daily as needed (pain). 03/15/18  Yes [provider]  famotidine (PEPCID) 20 MG tablet Take 20 mg by mouth daily.   Yes [provider]  fluticasone (FLONASE) 50 MCG/ACT nasal spray Place 2 sprays into both nostrils at bedtime.   Yes [provider]  HYDROcodone-acetaminophen (NORCO/VICODIN) 5-325 MG tablet Take 1 tablet by mouth every 6 (six) hours as needed for moderate pain.    Yes [provider]  loratadine (CLARITIN) 10 MG tablet Take 10 mg by mouth daily.   Yes [provider]  meclizine (ANTIVERT) 25 MG tablet Take 25 mg by mouth 3 (three) times daily as needed for dizziness.   Yes [provider]  Multiple Vitamin (MULTIVITAMIN WITH MINERALS) TABS tablet Take 1 tablet by mouth daily.   Yes [provider]  olmesartan  (BENICAR) 20 MG tablet Take 20 mg by mouth daily.   Yes [provider]  pantoprazole (PROTONIX) 40 MG tablet Take 1 tablet (40 mg total) by mouth daily. Patient needs office visit for further refills Patient taking differently: Take 40 mg by mouth 2 (two) times daily. Patient needs office visit for further refills 12/18/19  Yes [name], MD  vitamin B-12 (CYANOCOBALAMIN) 1000 MCG tablet Take 1,000 mcg by mouth daily.    Yes [provider]  vitamin E 400 UNIT capsule Take 400 Units by mouth daily.   Yes [provider]  nitroGLYCERIN (NITROSTAT) 0.4 MG SL tablet Place 0.4 mg under the tongue every 5 (five) minutes as needed for chest pain.     [provider]    Allergies:   Allergies  Allergen Reactions   Codeine Nausea And Vomiting    REACTION: nausea/vomiting   Oxaprozin     Other Reaction(s): Unknown   Sulfonamide Derivatives Itching and Rash    Social History:  reports that she has quit smoking. She has a 5 pack-year smoking history. She has never used smokeless tobacco. She reports that she does not drink alcohol and does not use drugs.  Family History: Family History  Problem Relation Age of Onset   Diabetes Father    Diabetes Other    Cancer Maternal Uncle        unknown type cancer    CAD Neg Hx    Stroke Neg Hx     Physical Exam: Vitals:   06/16/23 2300 06/17/23 0035 06/17/23 0203 06/17/23 0215  BP: (!) 174/62   (!) 154/65  Pulse: 74   72  Resp: 18   18  Temp:  (!) 95.3 F (35.2 C) (!) 97.4 F (36.3 C)   TempSrc:  Rectal Oral   SpO2: 99%   99%    General:  Alert and oriented times three, well developed and nourished, no acute distress Eyes: PERRLA, pink conjunctiva, no scleral icterus ENT: Moist oral mucosa, neck supple, no thyromegaly Lungs: clear to ascultation, no wheeze, no crackles, no use of accessory muscles Cardiovascular: regular rate and rhythm, no regurgitation, no gallops, no murmurs. No carotid bruits,  no JVD Abdomen: soft, positive BS, positive epigastric tenderness.  Positive left lower quadrant tenderness and suprapubic tenderness GU: not examined Neuro: CN II - XII grossly intact, sensation intact Musculoskeletal: strength 5/5 all extremities, no edema Skin: no rash, no subcutaneous crepitation, no decubitus Psych: appropriate patient   Labs on Admission:  Recent Labs    06/16/23 1905  NA 124*  K 5.5*  CL 97*  CO2 16*  GLUCOSE 123*  BUN 59*  CREATININE 3.45*  CALCIUM 8.8*   Recent Labs    06/16/23 1905  AST 15  ALT 14  ALKPHOS 65  BILITOT 0.6  PROT 7.4  ALBUMIN 3.5    Recent Labs    06/16/23 1905  WBC 13.7*  NEUTROABS  10.6*  HGB 10.9*  HCT 34.1*  MCV 96.1  PLT 222    Radiological Exams on Admission: CT ABDOMEN PELVIS WO CONTRAST  Result Date: 06/16/2023 CLINICAL DATA:  Abdominal pain, acute, nonlocalized EXAM: CT ABDOMEN AND PELVIS WITHOUT CONTRAST TECHNIQUE: Multidetector CT imaging of the abdomen and pelvis was performed following the standard protocol without IV contrast. RADIATION DOSE REDUCTION: This exam was performed according to the departmental dose-optimization program which includes automated exposure control, adjustment of the mA and/or kV according to patient size and/or use of iterative reconstruction technique. COMPARISON:  CT abdomen pelvis 08/07/2020, CT abdomen pelvis 02/13/2017 FINDINGS: Lower chest: Trace hiatal hernia.  No acute abnormality. Hepatobiliary: No focal liver abnormality. Calcified gallstone noted within the gallbladder lumen. Persistent several calcified gallstones within the common bile duct with interval increase in size of an enlarged common bile duct measuring up to 16 mm. No gallbladder wall thickening or pericholecystic fluid. Limited evaluation of the intrahepatic biliary ducts on this noncontrast study. Pancreas: Diffusely atrophic. No focal lesion. Otherwise normal pancreatic contour. No surrounding inflammatory changes.  No main pancreatic ductal dilatation. Spleen: Normal in size without focal abnormality.  Splenule noted. Adrenals/Urinary Tract: Status post right adrenalectomy. Coarse calcifications of the left adrenal gland likely sequelae of prior infection/hemorrhage-chronic no further follow-up indicated. No nephrolithiasis and no hydronephrosis. Bilateral renal cortical scarring. Fluid density lesions within the kidneys likely represent simple renal cysts. Chronic 1.1 cm hyperdense right renal lesion with a density of 79 Hounsfield units likely hemorrhagic or proteinaceous cyst-no further follow-up indicated. No ureterolithiasis or hydroureter. The urinary bladder is unremarkable. Stomach/Bowel: Stomach is within normal limits. No evidence of bowel wall thickening or dilatation. Colonic diverticulosis. Appendix appears normal. Vascular/Lymphatic: No abdominal aorta or iliac aneurysm. Severe atherosclerotic plaque of the aorta and its branches. No abdominal, pelvic, or inguinal lymphadenopathy. Reproductive: Status post hysterectomy. No adnexal masses. Other: No intraperitoneal free fluid. No intraperitoneal free gas. No organized fluid collection. Musculoskeletal: No abdominal wall hernia or abnormality. No suspicious lytic or blastic osseous lesions. No acute displaced fracture. 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 Mild retrolisthesis of L1 on L2 and L2 on L3. IMPRESSION: 1. Persistent cholelithiasis and persistent choledocholithiasis with slightly increased in size common bile duct dilatation. No CT finding of acute cholecystitis. Limited evaluation on this noncontrast study. 2. 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 3. Trace hiatal hernia. Electronically Signed   By: Tish Frederickson M.D.   On: 06/16/2023 22:37     Assessment/Plan Present on Admission:  Acute on chronic kidney disease -Patient baseline unclear as no recent labs. -Gentle IV fluid hydration.  BMP in a.m. -Strict I's and O's. -Avoid nephrotoxic agent -Patient admitted to [facility] given CKD and unclear history.  Hyperkalemia //metabolic acidosis -Lokelma given.  Repeat BMP in in 6 hours  Hyponatremia -Urine sodium, urine and plasma osmolality ordered.  TSH ordered -Patient not fluid overloaded, for now gentle IV fluid hydration ordered given patient's nausea and vomiting.  Likely some element of dehydration given patient's increased BUN. -Repeat BMP in 6 hours   Gastritis/heme positive stool  //   Choledocholithiasis -Increased BUN.  Patient with history of bleeding ulcer and ulcer in the colon -N.p.o. GI consult placed.  Dr. Ewing Schlein aware -IV Protonix ordered -Serial H&H every 8 hours -Patient denies GERD however patient maintained on p.o. Pepcid  Abnormal UA -Urine cultures collected.  Given empiric IV Rocephin in ER.  Await culture results -Patient does have some suprapubic tenderness   Severe DJD -Patient does not complain of pain.  Defer to a.m. team inpatient versus outpatient follow-up -Discharge.  Pain meds   Essential hypertension -Resume Norvasc, atenolol with hold parameters  ,  06/17/2023, 3:36 AM

## 2023-06-17 NOTE — Consult Note (Signed)
Consultation  Referring Provider:     [facility] Primary Care Physician:  Geoffry Paradise, MD Primary Gastroenterologist:       Yancey Flemings Reason for Consultation:     Choledocholithiasis, heme positive, anemia     Impression / Plan:   Anemia with heme positive stool with history of anemia   Chronic cholelithiasis and choledocholithiasis question relationship to current issues  Anorexia nausea and mild weight loss  Vague abdominal pain and some nausea and vomiting  Mild constipation  Acute on chronic kidney injury with hyponatremia, dehydration   Colonoscopy 2011 with sigmoid adenoma, colonoscopy 2018 with right colon ulcers thought from NSAIDs --------------------------------------------------------------------------------------------------- Multiple abnormalities here, I do not have a unifying diagnosis yet.  The cholelithiasis and choledocholithiasis may be asymptomatic though it is hard to know where the nausea and vomiting and abdominal pain came from.  She is dehydrated and hyponatremic, and she needs more hydration.  I think she will end up needing EGD, ERCP with stone extraction from the bile duct and a colonoscopy given the constellation of problems above.  I have ordered ferritin B12 folate for tomorrow.  I am going to let her have clear liquids and we will reassess tomorrow and determine order and timing of procedures.  I have explained these procedures to the patient as well including risks benefits and indications.  Iva Boop, MD, [facility] See Loretha Stapler on call - gastroenterology for best contact person 06/17/2023 9:21 AM         HPI:   Amy [name] is a 87 y.o. female admitted overnight after going to primary care (Dr. Lanell Matar office) feeling weak somewhat nauseous and having some vague upper abdominal pains.  She was found to have worsening renal function and directed to the emergency department.  She did have some nausea and vomiting  and abdominal pain a day or 2 prior to going to her PCP which was several days ago and then when her kidney function results returned abnormal she was directed to the ED.  CT scanning there demonstrated cholelithiasis and choledocholithiasis and some biliary ductal dilation.  These are chronic findings and go back years.  Cholelithiasis since 2018 and cholelithiasis and choledocholithiasis on 2021 CT abdomen and pelvis as well.  She has arthritis and a lot of back pain but does not use NSAIDs though she is on a baby aspirin.  She was Hemoccult positive in the emergency department.  She describes mild constipation recently having gone 3 days without a stool but did defecate yesterday.  She has not found any or seen any bleeding.  She had a colonoscopy in 2018 by Dr. Christella Hartigan, working up iron deficiency anemia when hospitalized and had multiple right colon ulcers.  Pathologist suspected most likely from NSAIDs left colon biopsies of the time were negative.  Previously seen by Dr. Marina Goodell in 2011 with sigmoid tubular adenoma removed at colonoscopy.  In the emergency department she was given a gram of Rocephin for suspected UTI, empirically.  She believes she is lost about 10 pounds recently.  No early satiety.  Review of systems positive for chronic back pain, other joint pain with arthritis as well, she says she has a problem with a blood vessel and she points to her clavicle and she cannot look up where she might pass out.  Says she is followed by cardiologist she cannot give me the name and I do not see anything in epic including care everywhere, regarding this.  8  5 office visit labs     [id number] METABOLIC PANEL (Order Date - 06/12/2023) (Collection Date & Time - 06/12/2023 11:45 AM)           Value Reference Range          GLUCOSE 103   60 - 110 - mg/dl          BUN 69 H 5 - 23 - mg/dl          CREATININE 3.4 PH 0.3 - 1.5 - mg/dl          eGFR Non-African American 12.8   < -          eGFR African  American 15.5   < -          SODIUM 136   135 - 148 - mEq/L          POTASSIUM 5.6 H 3.5 - 5.3 - mEq/L          CHLORIDE 109   80 - 111 - mEq/L          CO2 17   15 - 35 - mEq/L          CALCIUM 9.3   7.0 - 10.5 - mg/dL          Notes: Jarome Lamas 06/12/2023 01:54:43 PM > with AKI, baseline Cr 1.5-1.6       Lab:CBC (Order Date - 06/12/2023) (Collection Date & Time - 06/12/2023 11:45 AM)           Value Reference Range          WBC 9.52   4.10 - 10.90 - K/uL          LYM 2.2   0.6 - 4.1 - K/uL          EOS 0.0   0.0 - 0.4 -          BASO 0.1   0.0 - 0.2 -          LYM% 22.8   10.0 - 58.5 - %          EOS% 0.5   0.0 - 7.8 -          BASO% 0.6   0.0 - 2.0 -          RBC 3.5 L 4.2 - 6.3 - M/uL          HGB 10.9 L 12.0 - 18.0 - g/dL          HCT 47.4 L 25.9 - 51.0 - %          MCV 98.7 H 80.0 - 97.0 - fL          MCH 30.9   26.0 - 32.0 - pg          MCHC 31.3   31.0 - 36.0 - g/dL          RDW 56.3   87.5 - 15.4 -          PLT 211   140 - 440 - K/uL          MPV 7.3   6.9 - 10.6 -          NEU 6.5   1.4 - 7.0 -          NEU% 68.5   43.3 - 71.9 -          MONO 0.7   0.1 - 0.9 -  MONO% 7.5   4.6 - 12.4 -          Notes: Jarome Lamas 06/12/2023 01:24:39 PM > new anemia    Past Medical History:  Diagnosis Date   Adrenal myelolipoma    Arthritis    CKD (chronic kidney disease)    HTN (hypertension)    Hypercholesterolemia     Past Surgical History:  Procedure Laterality Date   ADRENALECTOMY Right    BACK SURGERY     CARDIAC CATHETERIZATION  01/02/2009   The anterior descending artery is moderate  in size. There is an eccentric 60-70% proximal stenosis followed by 50-60% mid stenosis. EF 75-80%   CARDIOVASCULAR STRESS TEST  12/26/2008   EF 65%   CATARACT EXTRACTION     COLONOSCOPY N/A 02/15/2017   Procedure: COLONOSCOPY;  Surgeon: Rachael Fee, MD;  Location[facility] ENDOSCOPY;  Service: Endoscopy;  Laterality: N/A;   CYSTECTOMY     Right Hand   HEMORROIDECTOMY      TONSILLECTOMY     TOTAL ABDOMINAL HYSTERECTOMY      Family History  Problem Relation Age of Onset   Diabetes Father    Diabetes Other    Cancer Maternal Uncle        unknown type cancer    CAD Neg Hx    Stroke Neg Hx     Social History   Tobacco Use   Smoking status: Former    Current packs/day: 0.25    Average packs/day: 0.3 packs/day for 20.0 years (5.0 ttl pk-yrs)    Types: Cigarettes   Smokeless tobacco: Never   Tobacco comments:    Quite 2009  Vaping Use   Vaping status: Never Used  Substance Use Topics   Alcohol use: No   Drug use: No    Prior to Admission medications   Medication Sig Start Date End Date Taking? Authorizing Provider  amLODipine (NORVASC) 10 MG tablet Take 10 mg by mouth daily.   Yes [provider]  aspirin 81 MG tablet Take 81 mg by mouth daily.   Yes [provider]  atenolol (TENORMIN) 50 MG tablet Take 50 mg by mouth daily.   Yes [provider]  CALCIUM-VITAMIN D PO Take 1 tablet by mouth daily.   Yes [provider]  cholecalciferol (VITAMIN D) 400 units TABS tablet Take 400 Units by mouth daily.   Yes [provider]  cyclobenzaprine (FLEXERIL) 10 MG tablet Take 10 mg by mouth daily as needed for muscle spasms. 06/12/17  Yes [provider]  diclofenac sodium (VOLTAREN) 1 % GEL Apply 2 g topically daily as needed (pain). 03/15/18  Yes [provider]  famotidine (PEPCID) 20 MG tablet Take 20 mg by mouth daily.   Yes [provider]  fluticasone (FLONASE) 50 MCG/ACT nasal spray Place 2 sprays into both nostrils at bedtime.   Yes [provider]  HYDROcodone-acetaminophen (NORCO/VICODIN) 5-325 MG tablet Take 1 tablet by mouth every 6 (six) hours as needed for moderate pain.    Yes [provider]  loratadine (CLARITIN) 10 MG tablet Take 10 mg by mouth daily.   Yes [provider]  meclizine (ANTIVERT) 25 MG tablet Take 25 mg by mouth 3 (three) times  daily as needed for dizziness.   Yes [provider]  Multiple Vitamin (MULTIVITAMIN WITH MINERALS) TABS tablet Take 1 tablet by mouth daily.   Yes [provider]  olmesartan (BENICAR) 20 MG tablet Take 20 mg by mouth daily.  Yes [provider]  pantoprazole (PROTONIX) 40 MG tablet Take 1 tablet (40 mg total) by mouth daily. Patient needs office visit for further refills Patient taking differently: Take 40 mg by mouth 2 (two) times daily. Patient needs office visit for further refills 12/18/19  Yes [name], MD  vitamin B-12 (CYANOCOBALAMIN) 1000 MCG tablet Take 1,000 mcg by mouth daily.    Yes [provider]  vitamin E 400 UNIT capsule Take 400 Units by mouth daily.   Yes [provider]  nitroGLYCERIN (NITROSTAT) 0.4 MG SL tablet Place 0.4 mg under the tongue every 5 (five) minutes as needed for chest pain.     [provider]    Current Facility-Administered Medications  Medication Dose Route Frequency Provider Last Rate Last Admin   0.9 %  sodium chloride infusion   Intravenous Continuous Crosley, Debby, MD 75 mL/hr at 06/17/23 0600 Restarted at 06/17/23 0600   acetaminophen (TYLENOL) tablet 650 mg  650 mg Oral Q6H PRN Crosley, Debby, MD       Or   acetaminophen (TYLENOL) suppository 650 mg  650 mg Rectal Q6H PRN Crosley, Debby, MD       pantoprazole (PROTONIX) injection 40 mg  40 mg Intravenous Q12H Crosley, Debby, MD        Allergies as of 06/16/2023 - Review Complete 06/16/2023  Allergen Reaction Noted   Codeine Nausea And Vomiting 12/02/2009   Oxaprozin  11/10/2009   Sulfonamide derivatives Itching and Rash 12/02/2009     Review of Systems:    This is positive for those things mentioned in the HPI. All other review of systems are negative.       Physical Exam:  Vital signs in last 24 hours: Temp:  [95.3 F (35.2 C)-97.8 F (36.6 C)] 97.4 F (36.3 C) (08/10 0812) Pulse Rate:  [64-82] 82 (08/10 0812) Resp:   [16-18] 16 (08/10 0812) BP: (140-174)/(46-65) 156/57 (08/10 0812) SpO2:  [99 %-100 %] 100 % (08/10 0812) Last BM Date : 06/16/23  General:  Well-developed, well-nourished and in no acute distress Eyes:  anicteric. ENT:   Mouth and posterior pharynx free of lesions.  Teeth are intact mostly. Neck:   supple w/o thyromegaly or mass.  Lungs: Decreased breath sounds at the bases Heart:   S1S2, no rubs, murmurs, gallops. Abdomen:  soft, question mildly tender to deep palpation in the right upper quadrant, no hepatosplenomegaly, hernia, or mass and BS+.  Rectal: Not performed Lymph:  no cervical or supraclavicular adenopathy. Extremities:   no edema Skin   no rash. Neuro:  A&O x 3.  Psych:  appropriate mood and  Affect.   Data Reviewed:   LAB RESULTS: Recent Labs    06/16/23 1905 06/17/23 0702  WBC 13.7*  --   HGB 10.9* 9.6*  HCT 34.1* 29.4*  PLT 222  --    BMET Recent Labs    06/16/23 1905 06/17/23 0702  NA 124* 127*  K 5.5* 4.8  CL 97* 97*  CO2 16* 14*  GLUCOSE 123* 108*  BUN 59* 49*  CREATININE 3.45* 3.27*  CALCIUM 8.8* 8.3*   LFT Recent Labs    06/16/23 1905  PROT 7.4  ALBUMIN 3.5  AST 15  ALT 14  ALKPHOS 65  BILITOT 0.6    STUDIES: Images reviewed personally CT ABDOMEN PELVIS WO CONTRAST  Result Date: 06/16/2023 CLINICAL DATA:  Abdominal pain, acute, nonlocalized EXAM: CT ABDOMEN AND PELVIS WITHOUT CONTRAST TECHNIQUE: Multidetector CT imaging of the abdomen and  pelvis was performed following the standard protocol without IV contrast. RADIATION DOSE REDUCTION: This exam was performed according to the departmental dose-optimization program which includes automated exposure control, adjustment of the mA and/or kV according to patient size and/or use of iterative reconstruction technique. COMPARISON:  CT abdomen pelvis 08/07/2020, CT abdomen pelvis 02/13/2017 FINDINGS: Lower chest: Trace hiatal hernia.  No acute abnormality. Hepatobiliary: No focal liver  abnormality. Calcified gallstone noted within the gallbladder lumen. Persistent several calcified gallstones within the common bile duct with interval increase in size of an enlarged common bile duct measuring up to 16 mm. No gallbladder wall thickening or pericholecystic fluid. Limited evaluation of the intrahepatic biliary ducts on this noncontrast study. Pancreas: Diffusely atrophic. No focal lesion. Otherwise normal pancreatic contour. No surrounding inflammatory changes. No main pancreatic ductal dilatation. Spleen: Normal in size without focal abnormality.  Splenule noted. Adrenals/Urinary Tract: Status post right adrenalectomy. Coarse calcifications of the left adrenal gland likely sequelae of prior infection/hemorrhage-chronic no further follow-up indicated. No nephrolithiasis and no hydronephrosis. Bilateral renal cortical scarring. Fluid density lesions within the kidneys likely represent simple renal cysts. Chronic 1.1 cm hyperdense right renal lesion with a density of 79 Hounsfield units likely hemorrhagic or proteinaceous cyst-no further follow-up indicated. No ureterolithiasis or hydroureter. The urinary bladder is unremarkable. Stomach/Bowel: Stomach is within normal limits. No evidence of bowel wall thickening or dilatation. Colonic diverticulosis. Appendix appears normal. Vascular/Lymphatic: No abdominal aorta or iliac aneurysm. Severe atherosclerotic plaque of the aorta and its branches. No abdominal, pelvic, or inguinal lymphadenopathy. Reproductive: Status post hysterectomy. No adnexal masses. Other: No intraperitoneal free fluid. No intraperitoneal free gas. No organized fluid collection. Musculoskeletal: No abdominal wall hernia or abnormality. No suspicious lytic or blastic osseous lesions. No acute displaced fracture. 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 Mild retrolisthesis of L1 on L2 and L2  on L3. IMPRESSION: 1. Persistent cholelithiasis and persistent choledocholithiasis with slightly increased in size common bile duct dilatation. No CT finding of acute cholecystitis. Limited evaluation on this noncontrast study. 2. 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 3. Trace hiatal hernia. Electronically Signed   By: Tish Frederickson M.D.   On: 06/16/2023 22:37        Thanks   LOS: 0 days   @  Sena Slate, MD, [facility] @  06/17/2023, 9:19 AM

## 2023-06-17 NOTE — Progress Notes (Signed)
  PROGRESS NOTE  Patient admitted earlier this morning. See H&P.   Amy [name] is an 87 year old female with past medical history HTN, iron deficiency anemia, GERD.  Per patient patient thought she had gastritis, had nausea, vomiting, abdominal pain.  Pain located in epigastric area and in her lower abdomen.  She denies diarrhea.  After she was getting progressively weaker and weaker.  She saw her PCP on Monday where blood work was done.  She was called today and told to go to the ER because of her kidney results.   In the emergency room, patient was found to have hyponatremia, acute kidney injury as well as positive fecal occult.  Patient was started on IV fluids.  GI was consulted  Patient states that she is feeling well overall, no further GI symptoms.  Denies any bright red blood in stool or black tarry stools.  A/P:  AKI -Insetting of GI loss -Unclear baseline creatinine -Hydrate with IV fluids, improved overnight  GI bleeding History of iron deficiency anemia -GI consulted -IV PPI -Planning for EGD/colonoscopy  Chronic cholelithiasis, choledocholithiasis -Planning for ERCP  Pyuria -Urine culture was not obtained -Empiric Rocephin --> fosfomycin  Hypertension -Norvasc, atenolol.  Hold Benicar in setting of AKI  Hyperkalemia -Resolved  Hyponatremia -Improving with IV fluid    Status is: Inpatient Remains inpatient appropriate because: GI procedures planned.  Remains on IV fluid   Noralee Stain, DO [facility] 06/17/2023, 12:55 PM  Available via Epic secure chat 7am-7pm After these hours, please refer to coverage provider listed on [email or web address]

## 2023-06-18 DIAGNOSIS — R634 Abnormal weight loss: Secondary | ICD-10-CM | POA: Diagnosis not present

## 2023-06-18 DIAGNOSIS — D649 Anemia, unspecified: Secondary | ICD-10-CM | POA: Diagnosis not present

## 2023-06-18 DIAGNOSIS — E871 Hypo-osmolality and hyponatremia: Secondary | ICD-10-CM | POA: Diagnosis not present

## 2023-06-18 DIAGNOSIS — N179 Acute kidney failure, unspecified: Secondary | ICD-10-CM | POA: Diagnosis not present

## 2023-06-18 DIAGNOSIS — K8070 Calculus of gallbladder and bile duct without cholecystitis without obstruction: Secondary | ICD-10-CM | POA: Diagnosis not present

## 2023-06-18 DIAGNOSIS — K5900 Constipation, unspecified: Secondary | ICD-10-CM | POA: Diagnosis not present

## 2023-06-18 LAB — BASIC METABOLIC PANEL WITH GFR
Anion gap: 9 (ref 5–15)
BUN: 43 mg/dL — ABNORMAL HIGH (ref 8–23)
CO2: 15 mmol/L — ABNORMAL LOW (ref 22–32)
Calcium: 7.7 mg/dL — ABNORMAL LOW (ref 8.9–10.3)
Chloride: 106 mmol/L (ref 98–111)
Creatinine, Ser: 3.05 mg/dL — ABNORMAL HIGH (ref 0.44–1.00)
GFR, Estimated: 14 mL/min — ABNORMAL LOW (ref >60)
Glucose, Bld: 88 mg/dL (ref 70–99)
Potassium: 4.3 mmol/L (ref 3.5–5.1)
Sodium: 130 mmol/L — ABNORMAL LOW (ref 135–145)

## 2023-06-18 LAB — CBC
HCT: 26.3 % — ABNORMAL LOW (ref 36.0–46.0)
Hemoglobin: 8.6 g/dL — ABNORMAL LOW (ref 12.0–15.0)
MCH: 30.6 pg (ref 26.0–34.0)
MCHC: 32.7 g/dL (ref 30.0–36.0)
MCV: 93.6 fL (ref 80.0–100.0)
Platelets: 170 10*3/uL (ref 150–400)
RBC: 2.81 MIL/uL — ABNORMAL LOW (ref 3.87–5.11)
RDW: 15.2 % (ref 11.5–15.5)
WBC: 9.5 10*3/uL (ref 4.0–10.5)
nRBC: 0 % (ref 0.0–0.2)

## 2023-06-18 LAB — GLUCOSE, CAPILLARY: Glucose-Capillary: 96 mg/dL (ref 70–99)

## 2023-06-18 LAB — MAGNESIUM: Magnesium: 1.4 mg/dL — ABNORMAL LOW (ref 1.7–2.4)

## 2023-06-18 MED ORDER — MAGNESIUM SULFATE 2 GM/50ML IV SOLN
2.0000 g | Freq: Once | INTRAVENOUS | Status: AC
  Administered 2023-06-18: 2 g via INTRAVENOUS
  Filled 2023-06-18: qty 50

## 2023-06-18 MED ORDER — FOLIC ACID 1 MG PO TABS
1.0000 mg | ORAL_TABLET | Freq: Every day | ORAL | Status: DC
  Administered 2023-06-18 – 2023-06-28 (×11): 1 mg via ORAL
  Filled 2023-06-18 (×11): qty 1

## 2023-06-18 NOTE — Progress Notes (Signed)
PROGRESS NOTE    Amy [name]  ZOX:[id number] DOB: 1936-05-19 DOA: 06/16/2023 PCP: Geoffry Paradise, MD     Brief Narrative:  Amy [name] is an 87 year old female with past medical history HTN, iron deficiency anemia, GERD.  Per patient patient thought she had gastritis, had nausea, vomiting, abdominal pain.  Pain located in epigastric area and in her lower abdomen.  She denies diarrhea.  After she was getting progressively weaker and weaker.  She saw her PCP on Monday where blood work was done.  She was called today and told to go to the ER because of her kidney results.   New events last 24 hours / Subjective: Patient states that she is feeling better.  Complains of some chronic pain in her feet ?Arthritis  Assessment & Plan:    Principal Problem:   Acute kidney injury (HCC) Active Problems:   AKI (acute kidney injury) (HCC)   Essential hypertension   Hyperkalemia   Hyponatremia   Metabolic acidosis   Gastritis   Heme positive stool   AKI -Insetting of GI loss -Unclear baseline creatinine -Improving.  Continue IV fluid and monitor BMP   GI bleeding History of iron deficiency anemia -GI consulted -Positive FOBT  -IV PPI -Planning for EGD/colonoscopy   Chronic cholelithiasis, choledocholithiasis -Planning for ERCP   Pyuria -Urine culture was not obtained -Empiric Rocephin --> fosfomycin   Hypertension -Norvasc, atenolol.  Hold Benicar in setting of AKI   Hyperkalemia -Resolved   Hyponatremia -Improving with IV fluid  Hypomagnesia -Replace       DVT prophylaxis:  SCDs Start: 06/17/23 0647  Code Status: DNR Family Communication: Daughter at bedside Disposition Plan: Home Status is: Inpatient Remains inpatient appropriate because: Pending GI procedure recommendation, remains on IV fluids today    Antimicrobials:  Anti-infectives (From admission, onward)    Start     Dose/Rate Route Frequency Ordered Stop   06/17/23 1400  fosfomycin  (MONUROL) packet 3 g        3 g Oral  Once 06/17/23 1300 06/17/23 1720   06/16/23 2315  cefTRIAXone (ROCEPHIN) 1 g in sodium chloride 0.9 % 100 mL IVPB        1 g 200 mL/hr over 30 Minutes Intravenous  Once 06/16/23 2313 06/17/23 0509        Objective: Vitals:   06/17/23 1554 06/17/23 2018 06/18/23 0014 06/18/23 0607  BP: (!) 169/55 (!) 143/58 135/69 (!) 139/50  Pulse: 74 77 78 76  Resp: 16 16 16 16   Temp: (!) 97.4 F (36.3 C) 98 F (36.7 C) 97.7 F (36.5 C) 98.7 F (37.1 C)  TempSrc: Oral Oral Oral   SpO2: 99% 98% 99% 98%  Weight:    63.3 kg    Intake/Output Summary (Last 24 hours) at 06/18/2023 1200 Last data filed at 06/18/2023 0500 Gross per 24 hour  Intake 700.06 ml  Output 1100 ml  Net -399.94 ml   Filed Weights   06/18/23 0607  Weight: 63.3 kg    Examination:  General exam: Appears calm and comfortable  Respiratory system: Clear to auscultation. Respiratory effort normal. No respiratory distress. No conversational dyspnea.  Cardiovascular system: S1 & S2 heard, RRR. No murmurs. No pedal edema. Gastrointestinal system: Abdomen is nondistended, soft and nontender. Normal bowel sounds heard. Central nervous system: Alert and oriented. No focal neurological deficits. Speech clear.  Extremities: Symmetric in appearance  Skin: No rashes, lesions or ulcers on exposed skin  Psychiatry: Judgement and insight appear normal. Mood &  affect appropriate.   Data Reviewed: I have personally reviewed following labs and imaging studies  CBC: Recent Labs  Lab 06/16/23 1905 06/17/23 0702 06/18/23 0350  WBC 13.7*  --  9.5  NEUTROABS 10.6*  --   --   HGB 10.9* 9.6* 8.6*  HCT 34.1* 29.4* 26.3*  MCV 96.1  --  93.6  PLT 222  --  170   Basic Metabolic Panel: Recent Labs  Lab 06/16/23 1905 06/17/23 0702 06/18/23 0350  NA 124* 127* 130*  K 5.5* 4.8 4.3  CL 97* 97* 106  CO2 16* 14* 15*  GLUCOSE 123* 108* 88  BUN 59* 49* 43*  CREATININE 3.45* 3.27* 3.05*  CALCIUM  8.8* 8.3* 7.7*  MG  --   --  1.4*   GFR: CrCl cannot be calculated (Unknown ideal weight.). Liver Function Tests: Recent Labs  Lab 06/16/23 1905  AST 15  ALT 14  ALKPHOS 65  BILITOT 0.6  PROT 7.4  ALBUMIN 3.5   No results for input(s): "LIPASE", "AMYLASE" in the last 168 hours. No results for input(s): "AMMONIA" in the last 168 hours. Coagulation Profile: No results for input(s): "INR", "PROTIME" in the last 168 hours. Cardiac Enzymes: No results for input(s): "CKTOTAL", "CKMB", "CKMBINDEX", "TROPONINI" in the last 168 hours. BNP (last 3 results) No results for input(s): "PROBNP" in the last 8760 hours. HbA1C: No results for input(s): "HGBA1C" in the last 72 hours. CBG: Recent Labs  Lab 06/18/23 0852  GLUCAP 96   Lipid Profile: No results for input(s): "CHOL", "HDL", "LDLCALC", "TRIG", "CHOLHDL", "LDLDIRECT" in the last 72 hours. Thyroid Function Tests: No results for input(s): "TSH", "T4TOTAL", "FREET4", "T3FREE", "THYROIDAB" in the last 72 hours. Anemia Panel: Recent Labs    06/17/23 0702 06/17/23 1011  VITAMINB12  --  459  FOLATE 4.8*  --   FERRITIN  --  260  TIBC  --  220*  IRON  --  62   Sepsis Labs: No results for input(s): "PROCALCITON", "LATICACIDVEN" in the last 168 hours.  No results found for this or any previous visit (from the past 240 hour(s)).    Radiology Studies: CT ABDOMEN PELVIS WO CONTRAST  Result Date: 06/16/2023 CLINICAL DATA:  Abdominal pain, acute, nonlocalized EXAM: CT ABDOMEN AND PELVIS WITHOUT CONTRAST TECHNIQUE: Multidetector CT imaging of the abdomen and pelvis was performed following the standard protocol without IV contrast. RADIATION DOSE REDUCTION: This exam was performed according to the departmental dose-optimization program which includes automated exposure control, adjustment of the mA and/or kV according to patient size and/or use of iterative reconstruction technique. COMPARISON:  CT abdomen pelvis 08/07/2020, CT abdomen  pelvis 02/13/2017 FINDINGS: Lower chest: Trace hiatal hernia.  No acute abnormality. Hepatobiliary: No focal liver abnormality. Calcified gallstone noted within the gallbladder lumen. Persistent several calcified gallstones within the common bile duct with interval increase in size of an enlarged common bile duct measuring up to 16 mm. No gallbladder wall thickening or pericholecystic fluid. Limited evaluation of the intrahepatic biliary ducts on this noncontrast study. Pancreas: Diffusely atrophic. No focal lesion. Otherwise normal pancreatic contour. No surrounding inflammatory changes. No main pancreatic ductal dilatation. Spleen: Normal in size without focal abnormality.  Splenule noted. Adrenals/Urinary Tract: Status post right adrenalectomy. Coarse calcifications of the left adrenal gland likely sequelae of prior infection/hemorrhage-chronic no further follow-up indicated. No nephrolithiasis and no hydronephrosis. Bilateral renal cortical scarring. Fluid density lesions within the kidneys likely represent simple renal cysts. Chronic 1.1 cm hyperdense right renal lesion with a density of  79 Hounsfield units likely hemorrhagic or proteinaceous cyst-no further follow-up indicated. No ureterolithiasis or hydroureter. The urinary bladder is unremarkable. Stomach/Bowel: Stomach is within normal limits. No evidence of bowel wall thickening or dilatation. Colonic diverticulosis. Appendix appears normal. Vascular/Lymphatic: No abdominal aorta or iliac aneurysm. Severe atherosclerotic plaque of the aorta and its branches. No abdominal, pelvic, or inguinal lymphadenopathy. Reproductive: Status post hysterectomy. No adnexal masses. Other: No intraperitoneal free fluid. No intraperitoneal free gas. No organized fluid collection. Musculoskeletal: No abdominal wall hernia or abnormality. No suspicious lytic or blastic osseous lesions. No acute displaced fracture. Multilevel severe degenerative changes of the spine with  impacted/fused L4-L5 levels and associated grade 2 anterolisthesis of L4 on L5. Mild retrolisthesis of L1 on L2 and L2 on L3. IMPRESSION: 1. Persistent cholelithiasis and persistent choledocholithiasis with slightly increased in size common bile duct dilatation. No CT finding of acute cholecystitis. Limited evaluation on this noncontrast study. 2. 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 3. Trace hiatal hernia. Electronically Signed   By: Tish Frederickson M.D.   On: 06/16/2023 22:37      Scheduled Meds:  amLODipine  10 mg Oral Daily   atenolol  50 mg Oral Daily   pantoprazole (PROTONIX) IV  40 mg Intravenous Q12H   Continuous Infusions:  sodium chloride 75 mL/hr at 06/18/23 0400     LOS: 1 day   Time spent: 25 minutes   Noralee Stain, DO [facility] 06/18/2023, 12:00 PM   Available via Epic secure chat 7am-7pm After these hours, please refer to coverage provider listed on [email or web address]

## 2023-06-18 NOTE — H&P (View-Only) (Signed)
   Patient Name: Amy [name] Date of Encounter: 06/18/2023, 12:31 PM     Assessment and Plan  Anemia with heme positive stool with history of anemia - mildly low folate, iron and B12 ok - looks like chronic disease on iron studies   Chronic cholelithiasis and choledocholithiasis question relationship to current issues   Anorexia nausea and mild weight loss   Vague abdominal pain and some nausea and vomiting   Mild constipation   Acute on chronic kidney injury with hyponatremia, dehydration - improved     Colonoscopy 2011 with sigmoid adenoma, colonoscopy 2018 with right colon ulcers thought from NSAIDs   --------------------------------------------------------------------------------------------------------  She is a little better and should be ok to proceed with EGD/ERCP tomorrow if we can get it scheduled.  Would do EGD and then ERCP to remove CBD stones. Pending these results could still need a colonoscopy.  Add Folate to meds  The risks and benefits as well as alternatives of endoscopic procedure(s) have been discussed and reviewed. All questions answered. The patient agrees to proceed. Daughters present for viosit today.      Subjective  Daughters here today - patient did have emesis of some foamy liquid/saliva today. Appetite still poor. No pain.   Objective  BP (!) 139/50 (BP Location: Right Arm)   Pulse 76   Temp 98.7 F (37.1 C)   Resp 16   Wt 63.3 kg   SpO2 98%   BMI 23.22 kg/m  Elderl ww NAD Lungs cta Coe NL Abd soft NT  Recent Labs  Lab 06/16/23 1905 06/17/23 0702 06/18/23 0350  HGB 10.9* 9.6* 8.6*  HCT 34.1* 29.4* 26.3*  WBC 13.7*  --  9.5  PLT 222  --  170     Lab Results  Component Value Date   IRON 62 06/17/2023   TIBC 220 (L) 06/17/2023   FERRITIN 260 06/17/2023    Lab Results  Component Value Date   VITAMINB12 459 06/17/2023   Lab Results  Component Value Date   FOLATE 4.8 (L) 06/17/2023   Recent Labs  Lab  06/16/23 1905 06/17/23 0702 06/18/23 0350  NA 124* 127* 130*  K 5.5* 4.8 4.3  CL 97* 97* 106  CO2 16* 14* 15*  GLUCOSE 123* 108* 88  BUN 59* 49* 43*  CREATININE 3.45* 3.27* 3.05*  CALCIUM 8.8* 8.3* 7.7*  MG  --   --  1.4*   Recent Labs  Lab 06/16/23 1905  AST 15  ALT 14  ALKPHOS 65  BILITOT 0.6  PROT 7.4  ALBUMIN 3.5       Iva Boop, MD, [facility] See Loretha Stapler on call - gastroenterology for best contact person 06/18/2023 12:31 PM

## 2023-06-18 NOTE — Plan of Care (Signed)

## 2023-06-18 NOTE — Progress Notes (Signed)
   Patient Name: Amy [name] Date of Encounter: 06/18/2023, 12:31 PM     Assessment and Plan  Anemia with heme positive stool with history of anemia - mildly low folate, iron and B12 ok - looks like chronic disease on iron studies   Chronic cholelithiasis and choledocholithiasis question relationship to current issues   Anorexia nausea and mild weight loss   Vague abdominal pain and some nausea and vomiting   Mild constipation   Acute on chronic kidney injury with hyponatremia, dehydration - improved     Colonoscopy 2011 with sigmoid adenoma, colonoscopy 2018 with right colon ulcers thought from NSAIDs   --------------------------------------------------------------------------------------------------------  She is a little better and should be ok to proceed with EGD/ERCP tomorrow if we can get it scheduled.  Would do EGD and then ERCP to remove CBD stones. Pending these results could still need a colonoscopy.  Add Folate to meds  The risks and benefits as well as alternatives of endoscopic procedure(s) have been discussed and reviewed. All questions answered. The patient agrees to proceed. Daughters present for viosit today.      Subjective  Daughters here today - patient did have emesis of some foamy liquid/saliva today. Appetite still poor. No pain.   Objective  BP (!) 139/50 (BP Location: Right Arm)   Pulse 76   Temp 98.7 F (37.1 C)   Resp 16   Wt 63.3 kg   SpO2 98%   BMI 23.22 kg/m  Elderl ww NAD Lungs cta Coe NL Abd soft NT  Recent Labs  Lab 06/16/23 1905 06/17/23 0702 06/18/23 0350  HGB 10.9* 9.6* 8.6*  HCT 34.1* 29.4* 26.3*  WBC 13.7*  --  9.5  PLT 222  --  170     Lab Results  Component Value Date   IRON 62 06/17/2023   TIBC 220 (L) 06/17/2023   FERRITIN 260 06/17/2023    Lab Results  Component Value Date   VITAMINB12 459 06/17/2023   Lab Results  Component Value Date   FOLATE 4.8 (L) 06/17/2023   Recent Labs  Lab  06/16/23 1905 06/17/23 0702 06/18/23 0350  NA 124* 127* 130*  K 5.5* 4.8 4.3  CL 97* 97* 106  CO2 16* 14* 15*  GLUCOSE 123* 108* 88  BUN 59* 49* 43*  CREATININE 3.45* 3.27* 3.05*  CALCIUM 8.8* 8.3* 7.7*  MG  --   --  1.4*   Recent Labs  Lab 06/16/23 1905  AST 15  ALT 14  ALKPHOS 65  BILITOT 0.6  PROT 7.4  ALBUMIN 3.5       Iva Boop, MD, [facility] See Loretha Stapler on call - gastroenterology for best contact person 06/18/2023 12:31 PM

## 2023-06-19 ENCOUNTER — Inpatient Hospital Stay (HOSPITAL_COMMUNITY): Payer: Medicare HMO | Admitting: Certified Registered Nurse Anesthetist

## 2023-06-19 ENCOUNTER — Inpatient Hospital Stay (HOSPITAL_COMMUNITY): Payer: Medicare HMO

## 2023-06-19 ENCOUNTER — Encounter (HOSPITAL_COMMUNITY): Payer: Self-pay | Admitting: Family Medicine

## 2023-06-19 ENCOUNTER — Encounter (HOSPITAL_COMMUNITY)
Admission: EM | Disposition: A | Discharge status: Skilled Nursing Facility | Payer: Self-pay | Source: Ambulatory Visit | Attending: Internal Medicine

## 2023-06-19 DIAGNOSIS — K2970 Gastritis, unspecified, without bleeding: Secondary | ICD-10-CM | POA: Diagnosis not present

## 2023-06-19 DIAGNOSIS — K449 Diaphragmatic hernia without obstruction or gangrene: Secondary | ICD-10-CM

## 2023-06-19 DIAGNOSIS — K259 Gastric ulcer, unspecified as acute or chronic, without hemorrhage or perforation: Secondary | ICD-10-CM

## 2023-06-19 DIAGNOSIS — N179 Acute kidney failure, unspecified: Secondary | ICD-10-CM | POA: Diagnosis not present

## 2023-06-19 DIAGNOSIS — Z87891 Personal history of nicotine dependence: Secondary | ICD-10-CM | POA: Diagnosis not present

## 2023-06-19 DIAGNOSIS — I10 Essential (primary) hypertension: Secondary | ICD-10-CM | POA: Diagnosis not present

## 2023-06-19 DIAGNOSIS — K8051 Calculus of bile duct without cholangitis or cholecystitis with obstruction: Secondary | ICD-10-CM

## 2023-06-19 DIAGNOSIS — K8050 Calculus of bile duct without cholangitis or cholecystitis without obstruction: Secondary | ICD-10-CM

## 2023-06-19 HISTORY — PX: SPHINCTEROTOMY: SHX5544

## 2023-06-19 HISTORY — PX: ESOPHAGOGASTRODUODENOSCOPY: SHX5428

## 2023-06-19 HISTORY — PX: REMOVAL OF STONES: SHX5545

## 2023-06-19 HISTORY — PX: ERCP: SHX5425

## 2023-06-19 HISTORY — PX: BIOPSY: SHX5522

## 2023-06-19 HISTORY — PX: BALLOON DILATION: SHX5330

## 2023-06-19 SURGERY — ERCP, WITH INTERVENTION IF INDICATED
Anesthesia: General

## 2023-06-19 MED ORDER — SODIUM CHLORIDE 0.9 % IV SOLN
1.5000 g | Freq: Once | INTRAVENOUS | Status: DC
  Filled 2023-06-19: qty 4

## 2023-06-19 MED ORDER — LACTATED RINGERS IV SOLN: INTRAVENOUS | Status: DC | PRN

## 2023-06-19 MED ORDER — DICLOFENAC SUPPOSITORY 100 MG: 100.0000 mg | Freq: Once | RECTAL | Status: DC

## 2023-06-19 MED ORDER — PHENYLEPHRINE HCL-NACL 20-0.9 MG/250ML-% IV SOLN
INTRAVENOUS | Status: DC | PRN
  Administered 2023-06-19: 50 ug/min via INTRAVENOUS

## 2023-06-19 MED ORDER — FENTANYL CITRATE (PF) 100 MCG/2ML IJ SOLN
INTRAMUSCULAR | Status: AC
  Filled 2023-06-19: qty 2

## 2023-06-19 MED ORDER — PROPOFOL 10 MG/ML IV BOLUS
INTRAVENOUS | Status: DC | PRN
Start: 2023-06-19 — End: 2023-06-19
  Administered 2023-06-19: 110 mg via INTRAVENOUS

## 2023-06-19 MED ORDER — SODIUM CHLORIDE 0.9 % IV SOLN
INTRAVENOUS | Status: DC | PRN
  Administered 2023-06-19: 50 mL

## 2023-06-19 MED ORDER — CIPROFLOXACIN IN D5W 400 MG/200ML IV SOLN
INTRAVENOUS | Status: AC
  Filled 2023-06-19: qty 200

## 2023-06-19 MED ORDER — DICLOFENAC SUPPOSITORY 100 MG
RECTAL | Status: AC
  Filled 2023-06-19: qty 1

## 2023-06-19 MED ORDER — CIPROFLOXACIN IN D5W 400 MG/200ML IV SOLN
INTRAVENOUS | Status: DC | PRN
  Administered 2023-06-19: 400 mg via INTRAVENOUS

## 2023-06-19 MED ORDER — LACTATED RINGERS IV SOLN: INTRAVENOUS | Status: DC

## 2023-06-19 MED ORDER — SUGAMMADEX SODIUM 200 MG/2ML IV SOLN
INTRAVENOUS | Status: DC | PRN
  Administered 2023-06-19: 200 mg via INTRAVENOUS

## 2023-06-19 MED ORDER — GLUCAGON HCL RDNA (DIAGNOSTIC) 1 MG IJ SOLR
INTRAMUSCULAR | Status: DC | PRN
  Administered 2023-06-19: .2 mg via INTRAVENOUS

## 2023-06-19 MED ORDER — ONDANSETRON HCL 4 MG/2ML IJ SOLN
INTRAMUSCULAR | Status: DC | PRN
  Administered 2023-06-19: 4 mg via INTRAVENOUS

## 2023-06-19 MED ORDER — DICLOFENAC SUPPOSITORY 100 MG
RECTAL | Status: DC | PRN
  Administered 2023-06-19: 100 mg via RECTAL

## 2023-06-19 MED ORDER — DEXAMETHASONE SODIUM PHOSPHATE 10 MG/ML IJ SOLN
INTRAMUSCULAR | Status: DC | PRN
  Administered 2023-06-19: 10 mg via INTRAVENOUS

## 2023-06-19 MED ORDER — LIDOCAINE 2% (20 MG/ML) 5 ML SYRINGE
INTRAMUSCULAR | Status: DC | PRN
  Administered 2023-06-19: 60 mg via INTRAVENOUS

## 2023-06-19 MED ORDER — PANTOPRAZOLE SODIUM 40 MG PO TBEC
40.0000 mg | DELAYED_RELEASE_TABLET | Freq: Two times a day (BID) | ORAL | Status: DC
  Administered 2023-06-19 – 2023-06-28 (×18): 40 mg via ORAL
  Filled 2023-06-19 (×18): qty 1

## 2023-06-19 MED ORDER — GLUCAGON HCL RDNA (DIAGNOSTIC) 1 MG IJ SOLR
INTRAMUSCULAR | Status: AC
  Filled 2023-06-19: qty 1

## 2023-06-19 MED ORDER — PHENYLEPHRINE 80 MCG/ML (10ML) SYRINGE FOR IV PUSH (FOR BLOOD PRESSURE SUPPORT)
PREFILLED_SYRINGE | INTRAVENOUS | Status: DC | PRN
  Administered 2023-06-19 (×3): 160 ug via INTRAVENOUS

## 2023-06-19 MED ORDER — ROCURONIUM BROMIDE 10 MG/ML (PF) SYRINGE
PREFILLED_SYRINGE | INTRAVENOUS | Status: DC | PRN
  Administered 2023-06-19: 50 mg via INTRAVENOUS

## 2023-06-19 MED ORDER — FENTANYL CITRATE (PF) 100 MCG/2ML IJ SOLN
INTRAMUSCULAR | Status: DC | PRN
  Administered 2023-06-19 (×2): 50 ug via INTRAVENOUS

## 2023-06-19 NOTE — Interval H&P Note (Signed)
History and Physical Interval Note:  06/19/2023 1:10 PM  Amy [name]  has presented today for surgery, with the diagnosis of Choledocholithiasis.  The various methods of treatment have been discussed with the patient and family. After consideration of risks, benefits and other options for treatment, the patient has consented to  Procedure(s): ENDOSCOPIC RETROGRADE CHOLANGIOPANCREATOGRAPHY (ERCP) (N/A) as a surgical intervention.  The patient's history has been reviewed, patient examined, no change in status, stable for surgery.  I have reviewed the patient's chart and labs.  Questions were answered to the patient's satisfaction.     Lynann Bologna

## 2023-06-19 NOTE — Anesthesia Procedure Notes (Signed)
Procedure Name: Intubation Date/Time: 06/19/2023 1:39 PM  Performed by: Nils Pyle, CRNAPre-anesthesia Checklist: Patient identified, Emergency Drugs available, Suction available and Patient being monitored Patient Re-evaluated:Patient Re-evaluated prior to induction Oxygen Delivery Method: Circle System Utilized Preoxygenation: Pre-oxygenation with 100% oxygen Induction Type: IV induction Ventilation: Mask ventilation without difficulty Laryngoscope Size: Miller and 2 Grade View: Grade I Tube type: Oral Tube size: 7.0 mm Number of attempts: 1 Airway Equipment and Method: Stylet and Oral airway Placement Confirmation: ETT inserted through vocal cords under direct vision, positive ETCO2 and breath sounds checked- equal and bilateral Secured at: 22 cm Tube secured with: Tape Dental Injury: Teeth and Oropharynx as per pre-operative assessment

## 2023-06-19 NOTE — Interval H&P Note (Signed)
History and Physical Interval Note:  06/19/2023 1:11 PM  Amy [name]  has presented today for surgery, with the diagnosis of Choledocholithiasis.  The various methods of treatment have been discussed with the patient and family. After consideration of risks, benefits and other options for treatment, the patient has consented to  Procedure(s): ENDOSCOPIC RETROGRADE CHOLANGIOPANCREATOGRAPHY (ERCP) (N/A) as a surgical intervention.  The patient's history has been reviewed, patient examined, no change in status, stable for surgery.  I have reviewed the patient's chart and labs.  Questions were answered to the patient's satisfaction.     Lynann Bologna

## 2023-06-19 NOTE — Progress Notes (Signed)
[facility] Progress Note  Patient: Amy [name]     JWJ:[id number]  DOA: 06/16/2023   PCP: Geoffry Paradise, MD       Brief hospital course: This is an 87 year old female who has a history of hypertension, GERD and iron deficiency anemia.  She was sent to the emergency department when outpatient lab work revealed a creatinine of 3.5.  In the ED, she was found to have hyponatremia, AKI and positive fecal occult blood.  Subjective:  The patient had a complaint of right foot pain when I evaluated her.  Her daughter was present in the room.  I was told by the daughter that the patient has been complaining of this pain persistently for a number days now.  The patient stated that the pain started after she dropped a frozen corn on her foot and has not since subsided.  Assessment and Plan: Principal Problem:   Acute kidney injury and hyponatremia -Felt to be secondary to GI losses -Creatinine has improved to 2.80 -Baseline creatinine is unknown  Active Problems:    Heme positive stool with normocytic anemia -Low folate level - EGD performed today-noted to have an 8 mm ulcer in the prepyloric area without active bleeding-biopsies obtained -She is receiving pantoprazole 40 mg twice daily and folate replacement -Aspirin 81 mg daily is on hold  Choledocholithiasis -ERCP with 8 mm biliary sphincterotomy and stone extraction performed today -She has been placed on a clear liquid diet -I have consulted general surgery today -Have stopped IV fluids as she is drinking well per the RN - will need to follow-up on oral intake, sodium and creatinine tomorrow  Right foot pain - Apparently the patient had been having intermittent pain in her right foot but this pain became constant after she dropped a frozen corn on her foot about a week or 2 ago -on exam, the foot is bruised and slightly discolored -Pedal pulse is difficult to palpate, however, the RN was able to find it with a  Doppler -Will obtain ABI as her pain may be resting claudication -Will obtain an x-ray of the foot to ensure that there is no fracture  Hypertension - Continue amlodipine and atenolol - Benicar on hold due to AKI     Code Status: DNR Consultants: GI and general surgery Level of Care: Level of care: Telemetry Medical Total time on patient care: 35 minutes DVT prophylaxis:  SCDs Start: 06/17/23 0647     Objective:   Vitals:   06/19/23 1504 06/19/23 1514 06/19/23 1524 06/19/23 1610  BP: (!) 110/40 (!) 119/40 (!) 120/46 (!) 107/34  Pulse: 66 65 62 62  Resp: 17 16 16 19   Temp: (!) 97 F (36.1 C)   (!) 97.4 F (36.3 C)  TempSrc: Temporal   Oral  SpO2: 99% 98% 96% 99%  Weight:      Height:       Filed Weights   06/18/23 0607 06/19/23 1210  Weight: 63.3 kg 63.3 kg   Exam: General exam: Appears comfortable  HEENT: oral mucosa moist Respiratory system: Clear to auscultation.  Cardiovascular system: S1 & S2 heard  Gastrointestinal system: Abdomen soft, non-tender, nondistended. Normal bowel sounds   Extremities: No cyanosis, clubbing or edema-right foot has bruising and dusky appearance Psychiatry:  Mood & affect appropriate.      CBC: Recent Labs  Lab 06/16/23 1905 06/17/23 0702 06/18/23 0350 06/19/23 0149  WBC 13.7*  --  9.5 9.6  NEUTROABS 10.6*  --   --   --  HGB 10.9* 9.6* 8.6* 9.3*  HCT 34.1* 29.4* 26.3* 28.8*  MCV 96.1  --  93.6 97.0  PLT 222  --  170 181   Basic Metabolic Panel: Recent Labs  Lab 06/16/23 1905 06/17/23 0702 06/18/23 0350 06/19/23 0149  NA 124* 127* 130* 131*  K 5.5* 4.8 4.3 4.2  CL 97* 97* 106 108  CO2 16* 14* 15* 16*  GLUCOSE 123* 108* 88 89  BUN 59* 49* 43* 37*  CREATININE 3.45* 3.27* 3.05* 2.80*  CALCIUM 8.8* 8.3* 7.7* 7.8*  MG  --   --  1.4* 2.1   GFR: Estimated Creatinine Clearance: 13.2 mL/min (A) (by C-G formula based on SCr of 2.8 mg/dL (H)).  Scheduled Meds:  amLODipine  10 mg Oral Daily   atenolol  50 mg Oral  Daily   diclofenac  100 mg Rectal Once   folic acid  1 mg Oral Daily   pantoprazole  40 mg Oral BID   Continuous Infusions: Imaging and lab data was personally reviewed DG ERCP  Result Date: 06/19/2023 CLINICAL DATA:  ERCP EXAM: ERCP TECHNIQUE: Multiple spot images obtained with the fluoroscopic device and submitted for interpretation post-procedure. FLUOROSCOPY: Refer to separate report COMPARISON:  None Available. FINDINGS: A total of 13 fluoroscopic spot images obtained during ERCP are submitted for review. Initial images demonstrate a scope overlying the upper abdomen. Surgical clips are present. Wire catheterization and contrast injection of the common bile duct is performed. There appear to be multiple small filling defects best identified on image 7. Balloon sphincterotomy of the ampulla followed by balloon sweeps of the common bile duct appear to be performed. IMPRESSION: ERCP images as described. Refer to dedicated procedure report for full details. These images were submitted for radiologic interpretation only. Please see the procedural report for the amount of contrast and the fluoroscopy time utilized. Electronically Signed   By: Olive Bass M.D.   On: 06/19/2023 15:55    LOS: 2 days   Author: Calvert Cantor  06/19/2023 6:34 PM  To contact [facility]>   Check the care team in [facility] and look for the attending/consulting [facility] provider listed  Log into [email or web address] and use [place]'s universal password   Go to> "[facility]"  and find provider  If you still have difficulty reaching the provider, please page the [facility] (Director on Call) for the Hospitalists listed on amion

## 2023-06-19 NOTE — Transfer of Care (Signed)
Immediate Anesthesia Transfer of Care Note  Patient: Amy [name]  Procedure(s) Performed: ENDOSCOPIC RETROGRADE CHOLANGIOPANCREATOGRAPHY (ERCP) ESOPHAGOGASTRODUODENOSCOPY (EGD) BIOPSY SPHINCTEROTOMY REMOVAL OF STONES BALLOON DILATION  Patient Location: Endoscopy Unit  Anesthesia Type:General  Level of Consciousness: awake and drowsy  Airway & Oxygen Therapy: Patient Spontanous Breathing  Post-op Assessment: Report given to RN and Post -op Vital signs reviewed and stable  Post vital signs: Reviewed and stable  Last Vitals:  Vitals Value Taken Time  BP 110/40   Temp    Pulse 66 06/19/23 1503  Resp 18 06/19/23 1503  SpO2 99 % 06/19/23 1503  Vitals shown include unfiled device data.  Last Pain:  Vitals:   06/19/23 1210  TempSrc: Temporal  PainSc: 4       Patients Stated Pain Goal: 0 (06/17/23 1050)  Complications: No notable events documented.

## 2023-06-19 NOTE — Op Note (Signed)
[facility] Patient Name: Amy [name] Procedure Date : 06/19/2023 MRN: [id number] Attending MD: Lynann Bologna , MD, [id number] Date of Birth: 12-Oct-1936 CSN: [id number] Age: 87 Admit Type: Inpatient Procedure:                ERCP Indications:              Common bile duct stone(s) on CT. Unexplained new                            onset anemia. Providers:                Lynann Bologna, MD, Lorenza Evangelist, RN, Priscella Mann, Technician Referring MD:              Medicines:                General Anesthesia, Cipro 400 mg IV, glucagon 0.2                            mg IV Complications:            No immediate complications. Estimated Blood Loss:     Estimated blood loss: none. Mild oozing after                            sphincterotomy which stopped on its own. Procedure:                Pre-Anesthesia Assessment:                           - Prior to the procedure, a History and Physical                            was performed, and patient medications and                            allergies were reviewed. The patient's tolerance of                            previous anesthesia was also reviewed. The risks                            and benefits of the procedure and the sedation                            options and risks were discussed with the patient.                            All questions were answered, and informed consent                            was obtained. Prior Anticoagulants: The patient has  taken no anticoagulant or antiplatelet agents. ASA                            Grade Assessment: III - A patient with severe                            systemic disease. After reviewing the risks and                            benefits, the patient was deemed in satisfactory                            condition to undergo the procedure.                           After obtaining informed consent, the scope was                             passed under direct vision. Throughout the                            procedure, the patient's blood pressure, pulse, and                            oxygen saturations were monitored continuously. The                            [id number]-Q190V ([id number]) Olympus duodenoscope was                            introduced through the mouth, and used to inject                            contrast into and used to inject contrast into the                            bile duct. The GIF-H190 ([id number]) Olympus endoscope                            was introduced through the and used to inject                            contrast into. The ERCP was accomplished without                            difficulty. The patient tolerated the procedure                            well. Scope In: Scope Out: Findings:      EGD was performed prior to ERCP. Findings:      Esophagus: Wide open Schatzki's ring at 38 cm. 2 cm hiatal hernia      Stomach: 8 mm ulcer in prepyloric area deforming the pylorus without       active  bleeding. It had clean whitish base. No outlet obstruction.       Multiple biopsies were obtained. Mild atrophic gastritis.      Duodenum: First portion of the duodenum was slightly deformed. Otherwise       normal.      ERCP: The scout film was normal. Multiple surgical clips were noted. The       esophagus was successfully intubated under direct vision. The scope was       advanced to a normal major papilla in the descending duodenum. The bile       duct was deeply cannulated with the short-nosed traction sphincterotome       using a wire-guided approach on first attempt. Contrast was injected.       CBD was significantly dilated to 15 mm with multiple filling defects       consistent with choledocholithiasis. An 8 mm biliary sphincterotomy was       made with a monofilament traction (standard) sphincterotome using ERBE       electrocautery. There was self limited oozing from the  sphincterotomy       which did not require treatment. Dilation of the common bile duct with a       08-18-11 mm balloon (to a maximum balloon size of 11 mm) dilator was       successful x 1 minute each. The biliary tree was swept with a 15 mm       balloon starting at the bifurcation with resultant extraction of 8 dark       brown/blackish stones. Thereafter balloon was trolled several times and       out of the common bile duct. Postocclusion cholangiogram did not reveal       any residual filling defects. Cystic duct/gallbladder did not fill.      Pancreas duct was intentionally not cannulated or injected. Indocin       suppositories were given prior to insertion of the scope. Impression:               - Gastric ulcer without any active bleeding                            (biopsied).                           - Small hiatal hernia.                           - Choledocholithiasis s/p biliary sphincterotomy,                            sphincteroplasty and stone extraction.                           - Nonfilling of the cystic duct/gallbladder Recommendation:           - Return patient to hospital ward for ongoing care.                           - Clear liquid diet.                           - Recommend surgical consultation for possible lap  chole                           - Trend CBC                           - Protonix 40 mg p.o. twice daily x 8 weeks, then                            once a day                           - If continues to be anemic, recommend colonoscopy                            as an outpatient (See Dr Marvell Fuller adm note)                           - Watch for pancreatitis, bleeding, perforation,                            and cholangitis.                           - The findings and recommendations were discussed                            with the patient's family. Procedure Code(s):        --- Professional ---                            [phone], 59, Endoscopic retrograde                            cholangiopancreatography (ERCP); with                            trans-endoscopic balloon dilation of                            biliary/pancreatic duct(s) or of ampulla                            (sphincteroplasty), including sphincterotomy, when                            performed, each duct                           43264, Endoscopic retrograde                            cholangiopancreatography (ERCP); with removal of                            calculi/debris from biliary/pancreatic duct(s)  16109, Endoscopic catheterization of the biliary                            ductal system, radiological supervision and                            interpretation Diagnosis Code(s):        --- Professional ---                           K80.51, Calculus of bile duct without cholangitis                            or cholecystitis with obstruction CPT copyright 2022 [employer]. All rights reserved. The codes documented in this report are preliminary and upon coder review may  be revised to meet current compliance requirements. Lynann Bologna, MD 06/19/2023 2:59:29 PM This report has been signed electronically. Number of Addenda: 0

## 2023-06-19 NOTE — Consult Note (Signed)
CC: Choledocholithiasis  Requesting physician: Calvert Cantor   HPI: Amy [name] is an 87 y.o. female who is here for with hx of CKD, HTN, HLD, iron deficiency anemia presented to hospital for evaluation.  She was experiencing symptoms of upper abdominal pain nausea and vomiting.  She thought she may have gastritis.  This was all in her mid epigastrium region radiating to her lower abdomen.  She initially sought evaluation with her primary care on Monday but was found to have an elevated creatinine and was directed to the hospital.  CT abdomen/pelvis 06/16/2023 demonstrated cholelithiasis and choledocholithiasis with slight increase in common duct dilation.  No secondary findings of acute cholecystitis were noted.  She was admitted and underwent ERCP with Dr. Chales Abrahams earlier today.  Found to have a smaller gastric ulcer and additionally, was notable for choledocholithiasis which was cleared.  We were asked to see her this evening in consultation.  Of note, her total bilirubin was 0.9 on admission.  Past Medical History:  Diagnosis Date   Adrenal myelolipoma    Arthritis    CKD (chronic kidney disease)    HTN (hypertension)    Hypercholesterolemia     Past Surgical History:  Procedure Laterality Date   ADRENALECTOMY Right    BACK SURGERY     CARDIAC CATHETERIZATION  01/02/2009   The anterior descending artery is moderate  in size. There is an eccentric 60-70% proximal stenosis followed by 50-60% mid stenosis. EF 75-80%   CARDIOVASCULAR STRESS TEST  12/26/2008   EF 65%   CATARACT EXTRACTION     COLONOSCOPY N/A 02/15/2017   Procedure: COLONOSCOPY;  Surgeon: Rachael Fee, MD;  Location: [facility] ENDOSCOPY;  Service: Endoscopy;  Laterality: N/A;   CYSTECTOMY     Right Hand   HEMORROIDECTOMY     TONSILLECTOMY     TOTAL ABDOMINAL HYSTERECTOMY      Family History  Problem Relation Age of Onset   Diabetes Father    Diabetes Other    Cancer Maternal Uncle        unknown type cancer     CAD Neg Hx    Stroke Neg Hx     Social:  reports that she has quit smoking. Her smoking use included cigarettes. She has a 5 pack-year smoking history. She has never used smokeless tobacco. She reports that she does not drink alcohol and does not use drugs.  Allergies:  Allergies  Allergen Reactions   Codeine Nausea And Vomiting    REACTION: nausea/vomiting   Oxaprozin     Other Reaction(s): Unknown   Sulfonamide Derivatives Itching and Rash    Medications: I have reviewed the patient's current medications.  Results for orders placed or performed during the hospital encounter of 06/16/23 (from the past 48 hour(s))  CBC     Status: Abnormal   Collection Time: 06/18/23  3:50 AM  Result Value Ref Range   WBC 9.5 4.0 - 10.5 K/uL   RBC 2.81 (L) 3.87 - 5.11 MIL/uL   Hemoglobin 8.6 (L) 12.0 - 15.0 g/dL   HCT 04.5 (L) 40.9 - 81.1 %   MCV 93.6 80.0 - 100.0 fL   MCH 30.6 26.0 - 34.0 pg   MCHC 32.7 30.0 - 36.0 g/dL   RDW 91.4 78.2 - 95.6 %   Platelets 170 150 - 400 K/uL   nRBC 0.0 0.0 - 0.2 %    Comment: Performed at [facility], [address], [place], [place] [address]  Basic  metabolic panel     Status: Abnormal   Collection Time: 06/18/23  3:50 AM  Result Value Ref Range   Sodium 130 (L) 135 - 145 mmol/L   Potassium 4.3 3.5 - 5.1 mmol/L   Chloride 106 98 - 111 mmol/L   CO2 15 (L) 22 - 32 mmol/L   Glucose, Bld 88 70 - 99 mg/dL    Comment: Glucose reference range applies only to samples taken after fasting for at least 8 hours.   BUN 43 (H) 8 - 23 mg/dL   Creatinine, Ser 0.98 (H) 0.44 - 1.00 mg/dL   Calcium 7.7 (L) 8.9 - 10.3 mg/dL   GFR, Estimated 14 (L) >60 mL/min    Comment: (NOTE) Calculated using the CKD-EPI Creatinine Equation (2021)    Anion gap 9 5 - 15    Comment: Performed at [facility], [address] [place], [place] [address]  Magnesium     Status: Abnormal   Collection Time: 06/18/23  3:50 AM  Result Value Ref Range   Magnesium 1.4 (L) 1.7  - 2.4 mg/dL    Comment: Performed at [facility], [address] [place], [place] [address]  Glucose, capillary     Status: None   Collection Time: 06/18/23  8:52 AM  Result Value Ref Range   Glucose-Capillary 96 70 - 99 mg/dL    Comment: Glucose reference range applies only to samples taken after fasting for at least 8 hours.  CBC     Status: Abnormal   Collection Time: 06/19/23  1:49 AM  Result Value Ref Range   WBC 9.6 4.0 - 10.5 K/uL   RBC 2.97 (L) 3.87 - 5.11 MIL/uL   Hemoglobin 9.3 (L) 12.0 - 15.0 g/dL   HCT 62.1 (L) 30.8 - 65.7 %   MCV 97.0 80.0 - 100.0 fL   MCH 31.3 26.0 - 34.0 pg   MCHC 32.3 30.0 - 36.0 g/dL   RDW 84.6 (H) 96.2 - 95.2 %   Platelets 181 150 - 400 K/uL   nRBC 0.0 0.0 - 0.2 %    Comment: Performed at [facility], [address], [place], [place] [address]  Basic metabolic panel     Status: Abnormal   Collection Time: 06/19/23  1:49 AM  Result Value Ref Range   Sodium 131 (L) 135 - 145 mmol/L   Potassium 4.2 3.5 - 5.1 mmol/L   Chloride 108 98 - 111 mmol/L   CO2 16 (L) 22 - 32 mmol/L   Glucose, Bld 89 70 - 99 mg/dL    Comment: Glucose reference range applies only to samples taken after fasting for at least 8 hours.   BUN 37 (H) 8 - 23 mg/dL   Creatinine, Ser 4.40 (H) 0.44 - 1.00 mg/dL   Calcium 7.8 (L) 8.9 - 10.3 mg/dL   GFR, Estimated 16 (L) >60 mL/min    Comment: (NOTE) Calculated using the CKD-EPI Creatinine Equation (2021)    Anion gap 7 5 - 15    Comment: Performed at [facility], [address], [place], [place] [address]  Magnesium     Status: None   Collection Time: 06/19/23  1:49 AM  Result Value Ref Range   Magnesium 2.1 1.7 - 2.4 mg/dL    Comment: Performed at [facility], [address] [place], [place] [address]    DG Foot Complete Right  Result Date: 06/19/2023 CLINICAL DATA:  Right foot pain EXAM: RIGHT  FOOT COMPLETE - 3+ VIEW COMPARISON:  None Available. FINDINGS: Degenerative changes in the  interphalangeal joints, first metatarsal-phalangeal joint, and intertarsal joints. No evidence of acute fracture or dislocation. No focal bone lesion or bone destruction. 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 Soft tissues are unremarkable. IMPRESSION: No acute bony abnormalities.  Mild degenerative changes. Electronically Signed   By: Burman Nieves M.D.   On: 06/19/2023 21:47   DG ERCP  Result Date: 06/19/2023 CLINICAL DATA:  ERCP EXAM: ERCP TECHNIQUE: Multiple spot images obtained with the fluoroscopic device and submitted for interpretation post-procedure. FLUOROSCOPY: Refer to separate report COMPARISON:  None Available. FINDINGS: A total of 13 fluoroscopic spot images obtained during ERCP are submitted for review. Initial images demonstrate a scope overlying the upper abdomen. Surgical clips are present. Wire catheterization and contrast injection of the common bile duct is performed. There appear to be multiple small filling defects best identified on image 7. Balloon sphincterotomy of the ampulla followed by balloon sweeps of the common bile duct appear to be performed. IMPRESSION: ERCP images as described. Refer to dedicated procedure report for full details. These images were submitted for radiologic interpretation only. Please see the procedural report for the amount of contrast and the fluoroscopy time utilized. Electronically Signed   By: Olive Bass M.D.   On: 06/19/2023 15:55    ROS - all of the below systems have been reviewed with the patient and positives are indicated with bold text General: chills, fever or night sweats Eyes: blurry vision or double vision ENT: epistaxis or sore throat Allergy/Immunology: itchy/watery eyes or nasal congestion Hematologic/Lymphatic: bleeding problems, blood clots or swollen lymph nodes Endocrine: temperature intolerance or unexpected weight changes Breast: new or changing breast lumps or nipple discharge Resp: cough,  shortness of breath, or wheezing CV: chest pain or dyspnea on exertion GI: as per HPI GU: dysuria, trouble voiding, or hematuria MSK: joint pain or joint stiffness Neuro: TIA or stroke symptoms Derm: pruritus and skin lesion changes Psych: anxiety and depression  PE Blood pressure (!) 129/51, pulse 62, temperature (!) 97.3 F (36.3 C), temperature source Oral, resp. rate 18, height 5\' 5"  (1.651 m), weight 63.3 kg, SpO2 98%. Constitutional: NAD; conversant Eyes: Moist conjunctiva Lungs: Normal respiratory effort CV: RRR GI: Abd soft, not significantly tender or distended; no palpable hepatosplenomegaly MSK: Normal range of motion of extremities Psychiatric: Appropriate affect  Results for orders placed or performed during the hospital encounter of 06/16/23 (from the past 48 hour(s))  CBC     Status: Abnormal   Collection Time: 06/18/23  3:50 AM  Result Value Ref Range   WBC 9.5 4.0 - 10.5 K/uL   RBC 2.81 (L) 3.87 - 5.11 MIL/uL   Hemoglobin 8.6 (L) 12.0 - 15.0 g/dL   HCT 16.1 (L) 09.6 - 04.5 %   MCV 93.6 80.0 - 100.0 fL   MCH 30.6 26.0 - 34.0 pg   MCHC 32.7 30.0 - 36.0 g/dL   RDW 40.9 81.1 - 91.4 %   Platelets 170 150 - 400 K/uL   nRBC 0.0 0.0 - 0.2 %    Comment: Performed at [facility], [address] [place], [place] [address]  Basic metabolic panel     Status: Abnormal   Collection Time: 06/18/23  3:50 AM  Result Value Ref Range   Sodium 130 (L) 135 - 145 mmol/L   Potassium 4.3 3.5 - 5.1 mmol/L   Chloride 106 98 - 111 mmol/L  CO2 15 (L) 22 - 32 mmol/L   Glucose, Bld 88 70 - 99 mg/dL    Comment: Glucose reference range applies only to samples taken after fasting for at least 8 hours.   BUN 43 (H) 8 - 23 mg/dL   Creatinine, Ser 4.09 (H) 0.44 - 1.00 mg/dL   Calcium 7.7 (L) 8.9 - 10.3 mg/dL   GFR, Estimated 14 (L) >60 mL/min    Comment: (NOTE) Calculated using the CKD-EPI Creatinine Equation (2021)    Anion gap 9 5 - 15    Comment: Performed at [facility], [address] [place], [place] [address]  Magnesium     Status: Abnormal   Collection Time: 06/18/23  3:50 AM  Result Value Ref Range   Magnesium 1.4 (L) 1.7 - 2.4 mg/dL    Comment: Performed at [facility], [address] [place], [place] [address]  Glucose, capillary     Status: None   Collection Time: 06/18/23  8:52 AM  Result Value Ref Range   Glucose-Capillary 96 70 - 99 mg/dL    Comment: Glucose reference range applies only to samples taken after fasting for at least 8 hours.  CBC     Status: Abnormal   Collection Time: 06/19/23  1:49 AM  Result Value Ref Range   WBC 9.6 4.0 - 10.5 K/uL   RBC 2.97 (L) 3.87 - 5.11 MIL/uL   Hemoglobin 9.3 (L) 12.0 - 15.0 g/dL   HCT 56.2 (L) 13.0 - 86.5 %   MCV 97.0 80.0 - 100.0 fL   MCH 31.3 26.0 - 34.0 pg   MCHC 32.3 30.0 - 36.0 g/dL   RDW 78.4 (H) 69.6 - 29.5 %   Platelets 181 150 - 400 K/uL   nRBC 0.0 0.0 - 0.2 %    Comment: Performed at [facility], [address] [place], [place] [address]  Basic metabolic panel     Status: Abnormal   Collection Time: 06/19/23  1:49 AM  Result Value Ref Range   Sodium 131 (L) 135 - 145 mmol/L   Potassium 4.2 3.5 - 5.1 mmol/L   Chloride 108 98 - 111 mmol/L   CO2 16 (L) 22 - 32 mmol/L   Glucose, Bld 89 70 - 99 mg/dL    Comment: Glucose reference range applies only to samples taken after fasting for at least 8 hours.   BUN 37 (H) 8 - 23 mg/dL   Creatinine, Ser 2.44 (H) 0.44 - 1.00 mg/dL   Calcium 7.8 (L) 8.9 - 10.3 mg/dL   GFR, Estimated 16 (L) >60 mL/min    Comment: (NOTE) Calculated using the CKD-EPI Creatinine Equation (2021)    Anion gap 7 5 - 15    Comment: Performed at [facility], [address], [place], [place] [address]  Magnesium     Status: None   Collection Time: 06/19/23  1:49 AM  Result Value Ref Range   Magnesium 2.1 1.7 - 2.4 mg/dL    Comment: Performed at [facility], [address] [place], [place] [address]    DG Foot Complete  Right  Result Date: 06/19/2023 CLINICAL DATA:  Right foot pain EXAM: RIGHT FOOT COMPLETE - 3+ VIEW COMPARISON:  None Available. FINDINGS: Degenerative changes in the interphalangeal joints, first metatarsal-phalangeal joint, and intertarsal joints. No evidence of acute fracture or dislocation. No focal bone lesion or bone destruction. 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 Soft tissues  are unremarkable. IMPRESSION: No acute bony abnormalities.  Mild degenerative changes. Electronically Signed   By: Burman Nieves M.D.   On: 06/19/2023 21:47   DG ERCP  Result Date: 06/19/2023 CLINICAL DATA:  ERCP EXAM: ERCP TECHNIQUE: Multiple spot images obtained with the fluoroscopic device and submitted for interpretation post-procedure. FLUOROSCOPY: Refer to separate report COMPARISON:  None Available. FINDINGS: A total of 13 fluoroscopic spot images obtained during ERCP are submitted for review. Initial images demonstrate a scope overlying the upper abdomen. Surgical clips are present. Wire catheterization and contrast injection of the common bile duct is performed. There appear to be multiple small filling defects best identified on image 7. Balloon sphincterotomy of the ampulla followed by balloon sweeps of the common bile duct appear to be performed. IMPRESSION: ERCP images as described. Refer to dedicated procedure report for full details. These images were submitted for radiologic interpretation only. Please see the procedural report for the amount of contrast and the fluoroscopy time utilized. Electronically Signed   By: Olive Bass M.D.   On: 06/19/2023 15:55     A/P: Amy [name] is an 87 y.o. female with CKD, HTN, HLD, iron deficiency anemia here with now cleared choledocholithiasis  -Recommend cardiology evaluation/clearance -Monitor LFT/Cr and check lipase; monitor renal fxn -We will follow with you   I spent a total of 60 minutes in both face-to-face and non-face-to-face  activities, excluding procedures performed, for this visit on the date of this encounter.  Marin Olp, MD [facility], [facility]

## 2023-06-19 NOTE — Anesthesia Preprocedure Evaluation (Signed)
Anesthesia Evaluation  Patient identified by MRN, date of birth, ID band Patient awake    Reviewed: Allergy & Precautions, H&P , NPO status , Patient's Chart, lab work & pertinent test results  Airway Mallampati: II   Neck ROM: full    Dental   Pulmonary former smoker   breath sounds clear to auscultation       Cardiovascular hypertension,  Rhythm:regular Rate:Normal     Neuro/Psych    GI/Hepatic PUD,,,  Endo/Other    Renal/GU Renal InsufficiencyRenal disease     Musculoskeletal  (+) Arthritis ,    Abdominal   Peds  Hematology  (+) Blood dyscrasia, anemia   Anesthesia Other Findings   Reproductive/Obstetrics                             Anesthesia Physical Anesthesia Plan  ASA: 3  Anesthesia Plan: General   Post-op Pain Management:    Induction: Intravenous  PONV Risk Score and Plan: 3 and Ondansetron, Dexamethasone and Treatment may vary due to age or medical condition  Airway Management Planned: Oral ETT  Additional Equipment:   Intra-op Plan:   Post-operative Plan: Extubation in OR  Informed Consent: I have reviewed the patients History and Physical, chart, labs and discussed the procedure including the risks, benefits and alternatives for the proposed anesthesia with the patient or authorized representative who has indicated his/her understanding and acceptance.     Dental advisory given  Plan Discussed with: CRNA, Anesthesiologist and Surgeon  Anesthesia Plan Comments:        Anesthesia Quick Evaluation

## 2023-06-19 NOTE — Plan of Care (Signed)

## 2023-06-20 ENCOUNTER — Inpatient Hospital Stay (HOSPITAL_COMMUNITY): Payer: Medicare HMO

## 2023-06-20 ENCOUNTER — Encounter (HOSPITAL_COMMUNITY): Payer: Self-pay | Admitting: Family Medicine

## 2023-06-20 DIAGNOSIS — I342 Nonrheumatic mitral (valve) stenosis: Secondary | ICD-10-CM

## 2023-06-20 DIAGNOSIS — I10 Essential (primary) hypertension: Secondary | ICD-10-CM | POA: Diagnosis not present

## 2023-06-20 DIAGNOSIS — I2510 Atherosclerotic heart disease of native coronary artery without angina pectoris: Secondary | ICD-10-CM

## 2023-06-20 DIAGNOSIS — K259 Gastric ulcer, unspecified as acute or chronic, without hemorrhage or perforation: Secondary | ICD-10-CM | POA: Diagnosis not present

## 2023-06-20 DIAGNOSIS — K253 Acute gastric ulcer without hemorrhage or perforation: Secondary | ICD-10-CM

## 2023-06-20 DIAGNOSIS — K219 Gastro-esophageal reflux disease without esophagitis: Secondary | ICD-10-CM | POA: Diagnosis not present

## 2023-06-20 DIAGNOSIS — D509 Iron deficiency anemia, unspecified: Secondary | ICD-10-CM | POA: Diagnosis not present

## 2023-06-20 DIAGNOSIS — K8070 Calculus of gallbladder and bile duct without cholecystitis without obstruction: Secondary | ICD-10-CM | POA: Diagnosis not present

## 2023-06-20 DIAGNOSIS — N179 Acute kidney failure, unspecified: Secondary | ICD-10-CM | POA: Diagnosis not present

## 2023-06-20 DIAGNOSIS — Z01818 Encounter for other preprocedural examination: Secondary | ICD-10-CM

## 2023-06-20 DIAGNOSIS — I351 Nonrheumatic aortic (valve) insufficiency: Secondary | ICD-10-CM | POA: Diagnosis not present

## 2023-06-20 DIAGNOSIS — M79604 Pain in right leg: Secondary | ICD-10-CM | POA: Diagnosis not present

## 2023-06-20 LAB — ECHOCARDIOGRAM COMPLETE
AR max vel: 2.21 cm2
AV Area VTI: 2 cm2
AV Area mean vel: 1.96 cm2
AV Mean grad: 6 mmHg
AV Peak grad: 10.4 mmHg
Ao pk vel: 1.61 m/s
Area-P 1/2: 3.42 cm2
Height: 65 in
MV VTI: 2.14 cm2
P 1/2 time: 300 msec
S' Lateral: 3 cm
Weight: 2232.82 oz

## 2023-06-20 MED ORDER — ATENOLOL 50 MG PO TABS
25.0000 mg | ORAL_TABLET | Freq: Every day | ORAL | Status: DC
  Administered 2023-06-20 – 2023-06-28 (×9): 25 mg via ORAL
  Filled 2023-06-20 (×9): qty 1

## 2023-06-20 MED ORDER — CEFAZOLIN SODIUM-DEXTROSE 2-4 GM/100ML-% IV SOLN
2.0000 g | INTRAVENOUS | Status: AC
  Filled 2023-06-20: qty 100

## 2023-06-20 MED ORDER — SODIUM BICARBONATE 8.4 % IV SOLN
INTRAVENOUS | Status: DC
  Filled 2023-06-20: qty 150
  Filled 2023-06-20: qty 1000

## 2023-06-20 MED ORDER — AMLODIPINE BESYLATE 5 MG PO TABS
5.0000 mg | ORAL_TABLET | Freq: Every day | ORAL | Status: DC
  Administered 2023-06-20 – 2023-06-21 (×2): 5 mg via ORAL
  Filled 2023-06-20 (×2): qty 1

## 2023-06-20 NOTE — Care Management Important Message (Signed)
Important Message  Patient Details  Name: Amy [name] MRN: [id number] Date of Birth: 11-21-1935   Medicare Important Message Given:  Yes       06/20/2023, 2:09 PM

## 2023-06-20 NOTE — Progress Notes (Signed)
PROGRESS NOTE  Amy [name]  WUJ:[id number] DOB: 01/13/1936 DOA: 06/16/2023 PCP: Geoffry Paradise, MD   Brief Narrative: Patient is a 87 year old female with history of hypertension, GERD, iron deficiency anemia, CKD stage 3b/IV , hypertension, GERD, iron deficiency anemia who was sent to the emergency department after outpatient lab work showed creatinine of 3.5.  Further lab work showed hyponatremia, positive fecal occult blood, choledocholithiasis.  GI consulted.  Status post ERCP.  General surgery also consulted for consideration of cholecystectomy.  Patient currently not sure whether she will pursue surgery.  Still thinking.  Assessment & Plan:  Principal Problem:   Acute kidney injury (HCC) Active Problems:   AKI (acute kidney injury) (HCC)   Essential hypertension   Hyperkalemia   Hyponatremia   Metabolic acidosis   Gastritis   Heme positive stool   Cholelithiasis with choledocholithiasis   Loss of weight  Normocytic anemia/GI bleed: Hemoglobin was in the range of 9-10 on presentation.  FOBT positive.  GI consulted.  ERCP also showed gastric ulcer without any active bleeding.  Continue PPI twice daily.  Currently hemoglobin is stable.    AKI/non-anion gap metabolic acidosis: Creatinine on 04/11/2017 was at 1.9.  Unclear recent baseline.  She was sent by her PCP after outpatient lab work showed AKI.  On presentation, creatinine in the range of 3.45, sodium 124.  Suspected to be from dehydration from GI loss.  Kidney function has slightly improved, creatinine in the range of 2.8 today.  CO2 of 15.  She might benefit with gentle fluid with bicarb. CT imaging did not show any nephrolithiasis or hydronephrosis.  But showed bilateral cortical scaring, simple renal cyst.  She definitely needs to follow-up with nephrology as an outpatient.  Cholelithiasis/choledocholithiasis:She was complaining of abdominal pain, nausea, vomiting. Imaging showed several calcified gallstones within the common  bile duct measuring up to 16 mm.  GI consulted.  Status post ERCP with sphincterectomy, stone extraction.  General surgery consulted for lap chole. general surgery requesting cardiology evaluation for clearance .  Patient was reluctant for surgery because her abdomen pain was already resolved.  Upon my discussion with the patient and daughter, they state that they will think about it.  Will let general surgery know if patient wishes to go ahead for surgery.  Diet advanced to soft today.  Folic acid deficiency: Being supplemented  Hypertension: On amlodipine, atenolol at home.  Benicar on hold due to AKI currently blood pressure stable or soft.  Decreased dose of this medications  Right foot pain: Having intermittent right foot pain.  ABI ordered.  X-ray did not show any acute findings.Doppler found the pulse.  No concern for ischemia at present.  Does not complain of any pain today  Deconditioning: Patient ambulates with the help of walker/cane.  Will consult PT/OT.       DVT prophylaxis:SCDs Start: 06/17/23 9562     Code Status: DNR  Family Communication: Daughter on phone  Patient status:Inpatient  Patient is from :Home  Anticipated discharge ZH:YQMV  Estimated DC date: 1 to 2 days.  Depending upon decision on surgery   Consultants: GI,Surgery  Procedures:ERCP  Antimicrobials:  Anti-infectives (From admission, onward)    Start     Dose/Rate Route Frequency Ordered Stop   06/19/23 1215  ampicillin-sulbactam (UNASYN) 1.5 g in sodium chloride 0.9 % 100 mL IVPB  Status:  Discontinued        1.5 g 200 mL/hr over 30 Minutes Intravenous  Once 06/19/23 1214 06/19/23 1547   06/17/23  1400  fosfomycin (MONUROL) packet 3 g        3 g Oral  Once 06/17/23 1300 06/17/23 1720   06/16/23 2315  cefTRIAXone (ROCEPHIN) 1 g in sodium chloride 0.9 % 100 mL IVPB        1 g 200 mL/hr over 30 Minutes Intravenous  Once 06/16/23 2313 06/17/23 0509       Subjective: Patient seen and examined  at bedside today.  Hemodynamically stable.  Very comfortable.  Lying in bed.  She was talking to her daughter on phone.  She denies any abdomen pain, nausea or vomiting.  No foot pain. Long discussion held with the patient and daughter on phone as well.  Patient mentioned that she declined surgery when she talked with surgical PA but upon my discussion with daughter, they havent taken in the final decision.  Objective: Vitals:   06/19/23 1610 06/19/23 1942 06/19/23 1945 06/20/23 0432  BP: (!) 107/34 (!) 48/26 (!) 129/51 (!) 104/45  Pulse: 62 (!) 58 62 96  Resp: 19 17 18 17   Temp: (!) 97.4 F (36.3 C)  (!) 97.3 F (36.3 C) (!) 97.4 F (36.3 C)  TempSrc: Oral  Oral Oral  SpO2: 99% 99% 98% 98%  Weight:      Height:        Intake/Output Summary (Last 24 hours) at 06/20/2023 0737 Last data filed at 06/19/2023 1946 Gross per 24 hour  Intake 960 ml  Output 100 ml  Net 860 ml   Filed Weights   06/18/23 0607 06/19/23 1210  Weight: 63.3 kg 63.3 kg    Examination:  General exam: Overall comfortable, not in distress, pleasant elderly female HEENT: PERRL Respiratory system:  no wheezes,few crackles heard on the right base Cardiovascular system: S1 & S2 heard, RRR.  Gastrointestinal system: Abdomen is nondistended, soft and nontender. Central nervous system: Alert and oriented Extremities: No edema, no clubbing ,no cyanosis Skin: No rashes, no ulcers,no icterus     Data Reviewed: I have personally reviewed following labs and imaging studies  CBC: Recent Labs  Lab 06/16/23 1905 06/17/23 0702 06/18/23 0350 06/19/23 0149  WBC 13.7*  --  9.5 9.6  NEUTROABS 10.6*  --   --   --   HGB 10.9* 9.6* 8.6* 9.3*  HCT 34.1* 29.4* 26.3* 28.8*  MCV 96.1  --  93.6 97.0  PLT 222  --  170 181   Basic Metabolic Panel: Recent Labs  Lab 06/16/23 1905 06/17/23 0702 06/18/23 0350 06/19/23 0149 06/20/23 0307  NA 124* 127* 130* 131* 134*  K 5.5* 4.8 4.3 4.2 5.1  CL 97* 97* 106 108 109  CO2  16* 14* 15* 16* 15*  GLUCOSE 123* 108* 88 89 132*  BUN 59* 49* 43* 37* 37*  CREATININE 3.45* 3.27* 3.05* 2.80* 2.86*  CALCIUM 8.8* 8.3* 7.7* 7.8* 8.0*  MG  --   --  1.4* 2.1  --      No results found for this or any previous visit (from the past 240 hour(s)).   Radiology Studies: DG Foot Complete Right  Result Date: 06/19/2023 CLINICAL DATA:  Right foot pain EXAM: RIGHT FOOT COMPLETE - 3+ VIEW COMPARISON:  None Available. FINDINGS: Degenerative changes in the interphalangeal joints, first metatarsal-phalangeal joint, and intertarsal joints. No evidence of acute fracture or dislocation. No focal bone lesion or bone destruction. 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 Soft tissues are unremarkable. IMPRESSION: No acute bony abnormalities.  Mild degenerative changes. Electronically Signed  By: Burman Nieves M.D.   On: 06/19/2023 21:47   DG ERCP  Result Date: 06/19/2023 CLINICAL DATA:  ERCP EXAM: ERCP TECHNIQUE: Multiple spot images obtained with the fluoroscopic device and submitted for interpretation post-procedure. FLUOROSCOPY: Refer to separate report COMPARISON:  None Available. FINDINGS: A total of 13 fluoroscopic spot images obtained during ERCP are submitted for review. Initial images demonstrate a scope overlying the upper abdomen. Surgical clips are present. Wire catheterization and contrast injection of the common bile duct is performed. There appear to be multiple small filling defects best identified on image 7. Balloon sphincterotomy of the ampulla followed by balloon sweeps of the common bile duct appear to be performed. IMPRESSION: ERCP images as described. Refer to dedicated procedure report for full details. These images were submitted for radiologic interpretation only. Please see the procedural report for the amount of contrast and the fluoroscopy time utilized. Electronically Signed   By: Olive Bass M.D.   On: 06/19/2023 15:55    Scheduled Meds:   amLODipine  10 mg Oral Daily   atenolol  50 mg Oral Daily   diclofenac  100 mg Rectal Once   folic acid  1 mg Oral Daily   pantoprazole  40 mg Oral BID   Continuous Infusions:   LOS: 3 days   Burnadette Pop, MD [facility] P8/13/2024, 7:37 AM

## 2023-06-20 NOTE — Anesthesia Postprocedure Evaluation (Signed)
Anesthesia Post Note  Patient: Amy [name]  Procedure(s) Performed: ENDOSCOPIC RETROGRADE CHOLANGIOPANCREATOGRAPHY (ERCP) ESOPHAGOGASTRODUODENOSCOPY (EGD) BIOPSY SPHINCTEROTOMY REMOVAL OF STONES BALLOON DILATION     Patient location during evaluation: PACU Anesthesia Type: General Level of consciousness: awake and alert Pain management: pain level controlled Vital Signs Assessment: post-procedure vital signs reviewed and stable Respiratory status: spontaneous breathing, nonlabored ventilation and respiratory function stable Cardiovascular status: blood pressure returned to baseline and stable Postop Assessment: no apparent nausea or vomiting Anesthetic complications: no   No notable events documented.  Last Vitals:  Vitals:   06/20/23 0432 06/20/23 0810  BP: (!) 104/45 (!) 127/38  Pulse: 96 79  Resp: 17 18  Temp: (!) 36.3 C (!) 36.4 C  SpO2: 98% 99%    Last Pain:  Vitals:   06/20/23 0918  TempSrc:   PainSc: 3                  Beryle Lathe

## 2023-06-20 NOTE — Consult Note (Addendum)
Cardiology Consultation   Patient ID: Amy [name] MRN: [id number]; DOB: February 13, 1936  Admit date: 06/16/2023 Date of Consult: 06/20/2023  PCP:  Geoffry Paradise, MD   [facility] Providers Cardiologist:  None        Patient Profile:   Amy [name] is a 87 y.o. female with a hx of CAD HTN, HLD, dyspnea, tobacco use  who is being seen 06/20/2023 for the evaluation of pre-op risk secondary to choledocholithiasis and plan for cholecystectomy at the request of Dr Fredricka Bonine.  History of Present Illness:   Amy [name] with above hx did have abnormal stress nuc in 2010 and had cardiac cath with diffuse CAD,  stenosis in LAD but in very small branches.  1st and 2nd diag with tight stenosis but too small for PTCA and probably too small for CABG.  Significant OM stenosis but at takeoff of 1st OM with no good place for stenting. RCA is small prob not a good candidate for CABG.  Medical management planned.  No cardiac tests since that time or cardiology visits.    Pt presented to ER at request of PCP for elevated CR.  In 2018 Cr 1.19 and on the 9th was 3.45.  she had complaints of N,V, abd pain. Also becoming weaker and weaker.  No hx of chest pain.  No SOB.   IV fluids given.  Stool heme +.  Her K+ was elevated and lokelma given.     Since admit Her AKI felt to be due to GI loses and with IV fluids Cr improved to 2.80.  she has had EGD and found to have 8mm ulcer in prepyloric area without active bleeding-biopsies obtained ASA on hold.  She had ERCP with 8 mm biliary sphincterotomy and stone extraction  her HTN controlled and she is on amlodipine and atenolol.  Her Benicar on hold for AKI.   Surgery has seen and plans for chole tomorrow.    Pt had been doing her own house work until her recent illness.  She does vacuum and stops due to lower back pain.  No chest pain or SOB. She has not had any cardiac issues.    BP 149/50 P 71 R18 afebrile     Past Medical History:  Diagnosis Date    Adrenal myelolipoma    Arthritis    CAD in native artery    CKD (chronic kidney disease)    HTN (hypertension)    Hypercholesterolemia     Past Surgical History:  Procedure Laterality Date   ADRENALECTOMY Right    BACK SURGERY     CARDIAC CATHETERIZATION  01/02/2009   The anterior descending artery is moderate  in size. There is an eccentric 60-70% proximal stenosis followed by 50-60% mid stenosis. EF 75-80%   CARDIOVASCULAR STRESS TEST  12/26/2008   EF 65%   CATARACT EXTRACTION     COLONOSCOPY N/A 02/15/2017   Procedure: COLONOSCOPY;  Surgeon: Rachael Fee, MD;  Location: [facility] ENDOSCOPY;  Service: Endoscopy;  Laterality: N/A;   CYSTECTOMY     Right Hand   HEMORROIDECTOMY     TONSILLECTOMY     TOTAL ABDOMINAL HYSTERECTOMY       Home Medications:  Prior to Admission medications   Medication Sig Start Date End Date Taking? Authorizing Provider  amLODipine (NORVASC) 10 MG tablet Take 10 mg by mouth daily.   Yes [provider]  aspirin 81 MG tablet Take 81 mg by mouth daily.   Yes [provider]  atenolol (TENORMIN) 50 MG tablet Take 50 mg by mouth daily.   Yes [provider]  CALCIUM-VITAMIN D PO Take 1 tablet by mouth daily.   Yes [provider]  cholecalciferol (VITAMIN D) 400 units TABS tablet Take 400 Units by mouth daily.   Yes [provider]  cyclobenzaprine (FLEXERIL) 10 MG tablet Take 10 mg by mouth daily as needed for muscle spasms. 06/12/17  Yes [provider]  diclofenac sodium (VOLTAREN) 1 % GEL Apply 2 g topically daily as needed (pain). 03/15/18  Yes [provider]  famotidine (PEPCID) 20 MG tablet Take 20 mg by mouth daily.   Yes [provider]  fluticasone (FLONASE) 50 MCG/ACT nasal spray Place 2 sprays into both nostrils at bedtime.   Yes [provider]  HYDROcodone-acetaminophen (NORCO/VICODIN) 5-325 MG tablet Take 1 tablet by mouth every 6 (six) hours as needed for moderate  pain.    Yes [provider]  loratadine (CLARITIN) 10 MG tablet Take 10 mg by mouth daily.   Yes [provider]  meclizine (ANTIVERT) 25 MG tablet Take 25 mg by mouth 3 (three) times daily as needed for dizziness.   Yes [provider]  Multiple Vitamin (MULTIVITAMIN WITH MINERALS) TABS tablet Take 1 tablet by mouth daily.   Yes [provider]  olmesartan (BENICAR) 20 MG tablet Take 20 mg by mouth daily.   Yes [provider]  pantoprazole (PROTONIX) 40 MG tablet Take 1 tablet (40 mg total) by mouth daily. Patient needs office visit for further refills Patient taking differently: Take 40 mg by mouth 2 (two) times daily. Patient needs office visit for further refills 12/18/19  Yes [name], MD  vitamin B-12 (CYANOCOBALAMIN) 1000 MCG tablet Take 1,000 mcg by mouth daily.    Yes [provider]  vitamin E 400 UNIT capsule Take 400 Units by mouth daily.   Yes [provider]  nitroGLYCERIN (NITROSTAT) 0.4 MG SL tablet Place 0.4 mg under the tongue every 5 (five) minutes as needed for chest pain.     [provider]    Inpatient Medications: Scheduled Meds:  amLODipine  5 mg Oral Daily   atenolol  25 mg Oral Daily   diclofenac  100 mg Rectal Once   folic acid  1 mg Oral Daily   pantoprazole  40 mg Oral BID   Continuous Infusions:  [START ON 06/21/2023]  ceFAZolin (ANCEF) IV     sodium bicarbonate 150 mEq in dextrose 5 % 1,150 mL infusion 100 mL/hr at 06/20/23 1212   PRN Meds: acetaminophen **OR** acetaminophen, cyclobenzaprine, traMADol  Allergies:    Allergies  Allergen Reactions   Codeine Nausea And Vomiting    REACTION: nausea/vomiting   Oxaprozin     Other Reaction(s): Unknown   Sulfonamide Derivatives Itching and Rash    Social History:   Social History   Socioeconomic History   Marital status: Widowed    Spouse name: Not on file   Number of children: Not on file   Years of education: Not on file    Highest education level: Not on file  Occupational History   Not on file  Tobacco Use   Smoking status: Former    Current packs/day: 0.25    Average packs/day: 0.3 packs/day for 20.0 years (5.0 ttl pk-yrs)    Types: Cigarettes   Smokeless tobacco: Never   Tobacco comments:    Quite 2009  Vaping Use   Vaping status: Never  Used  Substance and Sexual Activity   Alcohol use: No   Drug use: No   Sexual activity: Not on file  Other Topics Concern   Not on file  Social History Narrative   Not on file   Social Determinants of Health   Financial Resource Strain: Not on file  Food Insecurity: No Food Insecurity (06/17/2023)   Hunger Vital Sign    Worried About Running Out of Food in the Last Year: Never true    Ran Out of Food in the Last Year: Never true  Transportation Needs: No Transportation Needs (06/17/2023)   PRAPARE - Administrator, Civil Service (Medical): No    Lack of Transportation (Non-Medical): No  Physical Activity: Not on file  Stress: Not on file  Social Connections: Not on file  Intimate Partner Violence: Not At Risk (06/17/2023)   Humiliation, Afraid, Rape, and Kick questionnaire    Fear of Current or Ex-Partner: No    Emotionally Abused: No    Physically Abused: No    Sexually Abused: No    Family History:    Family History  Problem Relation Age of Onset   Diabetes Father    Diabetes Other    Cancer Maternal Uncle        unknown type cancer    CAD Neg Hx    Stroke Neg Hx      ROS:  Please see the history of present illness.  General:no colds or fevers, no weight changes Skin:no rashes or ulcers HEENT:no blurred vision, no congestion CV:see HPI PUL:see HPI GI:no diarrhea constipation or melena, no indigestion GU:no hematuria, no dysuria MS:no joint pain, no claudication Neuro:no syncope, no lightheadedness Endo:no diabetes, no thyroid disease  All other ROS reviewed and negative.     Physical Exam/Data:   Vitals:   06/19/23  1945 06/20/23 0432 06/20/23 0810 06/20/23 1222  BP: (!) 129/51 (!) 104/45 (!) 127/38 (!) 149/50  Pulse: 62 96 79 71  Resp: 18 17 18    Temp: (!) 97.3 F (36.3 C) (!) 97.4 F (36.3 C) (!) 97.5 F (36.4 C) 97.7 F (36.5 C)  TempSrc: Oral Oral  Oral  SpO2: 98% 98% 99% 98%  Weight:      Height:        Intake/Output Summary (Last 24 hours) at 06/20/2023 1425 Last data filed at 06/20/2023 1411 Gross per 24 hour  Intake 878 ml  Output 450 ml  Net 428 ml      06/19/2023   12:10 PM 06/18/2023    6:07 AM 12/20/2019    3:21 PM  Last 3 Weights  Weight (lbs) 139 lb 8.8 oz 139 lb 8.8 oz 154 lb 4.8 oz  Weight (kg) 63.3 kg 63.3 kg 69.99 kg     Body mass index is 23.22 kg/m.  General:  Well nourished, well developed, in no acute distress HEENT: normal  she is hard of hearting Neck: no JVD Vascular: No carotid bruits; Distal pulses 2+ bilaterally Cardiac:  normal S1, S2; RRR; no murmur gallup rub or click Lungs:  clear to auscultation bilaterally, no wheezing, rhonchi or rales  Abd: soft, nontender, no hepatomegaly  Ext: no edema Musculoskeletal:  No deformities, BUE and BLE strength normal and equal Skin: warm and dry  Neuro:  alert and oriented X 3 MAE follows commands, no focal abnormalities noted Psych:  Normal affect   EKG:  The EKG was personally reviewed and demonstrates:  SR with 1st degree AV block PR 220  ms and T wave inversion in lateral leads but similar to EKG 2018. Telemetry:  Telemetry was personally reviewed and demonstrates:  SR with PVCs .  Relevant CV Studies: Cardiac cath 2010   diffuse CAD,  stenosis in LAD but in very small branches.  1st and 2nd diag with tight stenosis but too small for PTCA and probably too small for CABG.  Significant OM stenosis but at takeoff of 1st OM with no good place for stenting. RCA is small prob not a good candidate for CABG.  Laboratory Data:  High Sensitivity Troponin:  No results for input(s): "TROPONINIHS" in the last 720 hours.    Chemistry Recent Labs  Lab 06/18/23 0350 06/19/23 0149 06/20/23 0307  NA 130* 131* 134*  K 4.3 4.2 5.1  CL 106 108 109  CO2 15* 16* 15*  GLUCOSE 88 89 132*  BUN 43* 37* 37*  CREATININE 3.05* 2.80* 2.86*  CALCIUM 7.7* 7.8* 8.0*  MG 1.4* 2.1  --   GFRNONAA 14* 16* 16*  ANIONGAP 9 7 10     Recent Labs  Lab 06/16/23 1905 06/20/23 0307  PROT 7.4 5.2*  ALBUMIN 3.5 2.2*  AST 15 24  ALT 14 19  ALKPHOS 65 54  BILITOT 0.6 0.4   Lipids No results for input(s): "CHOL", "TRIG", "HDL", "LABVLDL", "LDLCALC", "CHOLHDL" in the last 168 hours.  Hematology Recent Labs  Lab 06/16/23 1905 06/17/23 0702 06/18/23 0350 06/19/23 0149  WBC 13.7*  --  9.5 9.6  RBC 3.55*  --  2.81* 2.97*  HGB 10.9* 9.6* 8.6* 9.3*  HCT 34.1* 29.4* 26.3* 28.8*  MCV 96.1  --  93.6 97.0  MCH 30.7  --  30.6 31.3  MCHC 32.0  --  32.7 32.3  RDW 15.0  --  15.2 15.6*  PLT 222  --  170 181   Thyroid No results for input(s): "TSH", "FREET4" in the last 168 hours.  BNPNo results for input(s): "BNP", "PROBNP" in the last 168 hours.  DDimer No results for input(s): "DDIMER" in the last 168 hours.   Radiology/Studies:  VAS [place] ABI WITH/WO TBI  Result Date: 06/20/2023  LOWER EXTREMITY DOPPLER STUDY Patient Name:  CHISA MORGANELLI  Date of Exam:   06/20/2023 Medical Rec #: [id number]       Accession #:    [id number] Date of Birth: 1936/09/28       Patient Gender: F Patient Age:   50 years Exam Location:  [facility] Procedure:      VAS [place] ABI WITH/WO TBI Referring Phys: Calvert Cantor --------------------------------------------------------------------------------  Indications: Rest pain. High Risk Factors: Hypertension, past history of smoking.  Comparison Study: No previous study. Performing Technologist: McKayla Maag RVT, [place]  Examination Guidelines: A complete evaluation includes at minimum, Doppler waveform signals and systolic blood pressure reading at the level of bilateral brachial, anterior tibial, and posterior  tibial arteries, when vessel segments are accessible. Bilateral testing is considered an integral part of a complete examination. Photoelectric Plethysmograph (PPG) waveforms and toe systolic pressure readings are included as required and additional duplex testing as needed. Limited examinations for reoccurring indications may be performed as noted.  ABI Findings: +---------+--------------+-----+-------------------+---------------------------+ Right    Rt Pressure   IndexWaveform           Comment                              (mmHg)                                                            +---------+--------------+-----+-------------------+---------------------------+  Brachial 154                biphasic                                       +---------+--------------+-----+-------------------+---------------------------+ PTA      255           1.66 monophasic         Non-compressible            +---------+--------------+-----+-------------------+---------------------------+ DP                          dampened monophasicWaveform obtained with 2D                                                  imaging. No pressure                                                       obtained.                   +---------+--------------+-----+-------------------+---------------------------+ Great Toe39            0.25 Abnormal                                       +---------+--------------+-----+-------------------+---------------------------+ +---------+------------------+-----+----------+----------------+ Left     Lt Pressure (mmHg)IndexWaveform  Comment          +---------+------------------+-----+----------+----------------+ PTA      255               1.66 monophasicNon-compressible +---------+------------------+-----+----------+----------------+ DP       96                0.62 monophasic                  +---------+------------------+-----+----------+----------------+ Great Toe57                0.37 Abnormal                   +---------+------------------+-----+----------+----------------+ +-------+----------------+-----------+------------+------------+ ABI/TBIToday's ABI     Today's TBIPrevious ABIPrevious TBI +-------+----------------+-----------+------------+------------+ Right  Non-compressible0.25                                +-------+----------------+-----------+------------+------------+ Left   Non-compressible0.37                                +-------+----------------+-----------+------------+------------+   Summary: Right: Resting right ankle-brachial index indicates noncompressible right lower extremity arteries. The right toe-brachial index is abnormal. Left: Resting left ankle-brachial index indicates noncompressible left lower extremity arteries. The left toe-brachial index is abnormal. *See table(s) above for measurements and observations.     Preliminary    DG Foot Complete Right  Result Date: 06/19/2023 CLINICAL DATA:  Right foot pain EXAM: RIGHT FOOT COMPLETE - 3+ VIEW COMPARISON:  None Available. FINDINGS: Degenerative changes in the interphalangeal  joints, first metatarsal-phalangeal joint, and intertarsal joints. No evidence of acute fracture or dislocation. No focal bone lesion or bone destruction. 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 Soft tissues are unremarkable. IMPRESSION: No acute bony abnormalities.  Mild degenerative changes. Electronically Signed   By: Burman Nieves M.D.   On: 06/19/2023 21:47   DG ERCP  Result Date: 06/19/2023 CLINICAL DATA:  ERCP EXAM: ERCP TECHNIQUE: Multiple spot images obtained with the fluoroscopic device and submitted for interpretation post-procedure. FLUOROSCOPY: Refer to separate report COMPARISON:  None Available. FINDINGS: A total of 13 fluoroscopic spot images obtained during ERCP are submitted  for review. Initial images demonstrate a scope overlying the upper abdomen. Surgical clips are present. Wire catheterization and contrast injection of the common bile duct is performed. There appear to be multiple small filling defects best identified on image 7. Balloon sphincterotomy of the ampulla followed by balloon sweeps of the common bile duct appear to be performed. IMPRESSION: ERCP images as described. Refer to dedicated procedure report for full details. These images were submitted for radiologic interpretation only. Please see the procedural report for the amount of contrast and the fluoroscopy time utilized. Electronically Signed   By: Olive Bass M.D.   On: 06/19/2023 15:55   CT ABDOMEN PELVIS WO CONTRAST  Result Date: 06/16/2023 CLINICAL DATA:  Abdominal pain, acute, nonlocalized EXAM: CT ABDOMEN AND PELVIS WITHOUT CONTRAST TECHNIQUE: Multidetector CT imaging of the abdomen and pelvis was performed following the standard protocol without IV contrast. RADIATION DOSE REDUCTION: This exam was performed according to the departmental dose-optimization program which includes automated exposure control, adjustment of the mA and/or kV according to patient size and/or use of iterative reconstruction technique. COMPARISON:  CT abdomen pelvis 08/07/2020, CT abdomen pelvis 02/13/2017 FINDINGS: Lower chest: Trace hiatal hernia.  No acute abnormality. Hepatobiliary: No focal liver abnormality. Calcified gallstone noted within the gallbladder lumen. Persistent several calcified gallstones within the common bile duct with interval increase in size of an enlarged common bile duct measuring up to 16 mm. No gallbladder wall thickening or pericholecystic fluid. Limited evaluation of the intrahepatic biliary ducts on this noncontrast study. Pancreas: Diffusely atrophic. No focal lesion. Otherwise normal pancreatic contour. No surrounding inflammatory changes. No main pancreatic ductal dilatation. Spleen: Normal in  size without focal abnormality.  Splenule noted. Adrenals/Urinary Tract: Status post right adrenalectomy. Coarse calcifications of the left adrenal gland likely sequelae of prior infection/hemorrhage-chronic no further follow-up indicated. No nephrolithiasis and no hydronephrosis. Bilateral renal cortical scarring. Fluid density lesions within the kidneys likely represent simple renal cysts. Chronic 1.1 cm hyperdense right renal lesion with a density of 79 Hounsfield units likely hemorrhagic or proteinaceous cyst-no further follow-up indicated. No ureterolithiasis or hydroureter. The urinary bladder is unremarkable. Stomach/Bowel: Stomach is within normal limits. No evidence of bowel wall thickening or dilatation. Colonic diverticulosis. Appendix appears normal. Vascular/Lymphatic: No abdominal aorta or iliac aneurysm. Severe atherosclerotic plaque of the aorta and its branches. No abdominal, pelvic, or inguinal lymphadenopathy. Reproductive: Status post hysterectomy. No adnexal masses. Other: No intraperitoneal free fluid. No intraperitoneal free gas. No organized fluid collection. Musculoskeletal: No abdominal wall hernia or abnormality. No suspicious lytic or blastic osseous lesions. No acute displaced fracture. 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 Mild retrolisthesis of L1 on L2 and L2 on L3. IMPRESSION: 1. Persistent cholelithiasis and persistent choledocholithiasis with slightly increased in size common bile duct dilatation. No CT finding of  acute cholecystitis. Limited evaluation on this noncontrast study. 2. 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 3. Trace hiatal hernia. Electronically Signed   By: Tish Frederickson M.D.   On: 06/16/2023 22:37     Assessment and Plan:   Pre-op eval for cholelithiasis needing  cholecystectomy and has had ERCP 06/19/23  Choledocholithiasis s/p biliary sphincterotomy, sphincteroplasty and stone extraction; nonfilling of the cystic duct/ gallbladder  that she tolerated well.  Remote cardiac cath 2010 with more small vessel disease and medical treatment but no recent echo.  EKG is stable.  Dr. Tresa Endo to see for full risk. will check Echo.  Known CAD should be on statin.    AKI this admit improved with IV fluids.  Per IM GI bleed with gastric ulcer without active bleeding on ERCP.  On PPI BID   HTN stable on amlodipine and atenolol holding benicar with AKI    Risk Assessment/Risk Scores:     For questions or updates, please contact [facility] Please consult [email or web address] for contact info under    Signed, Nada Boozer, NP  06/20/2023 2:25 PM    Patient seen and examined. Agree with assessment and plan.  Patient is a young appearing 87 year old female who has known CAD and catheterization in 2010 showed stenoses in small branches involving her LAD, diagonal, and marginal vessels.  The vessels were felt to be too small for stenting and not a good candidate for CABG.  Medical management was recommended.  She had been doing well from a cardiovascular standpoint.  She recently developed acute kidney injury with creatinine increasing to 3.05 on admission, improving slightly to 2.8.  She is in need for cholecystectomy and apparently had significant Coley toco lithiasis and underwent biliary sphincterotomy, cysts sphincteroplasty, and stone extraction yesterday.  Presently she denies any chest pain or shortness of breath.  She just had a 2D echo Doppler study for preoperative assessment this revealed normal systolic function with EF at 60 to 65% with normal wall motion.  There was evidence for mild mitral stenosis with a mean gradient of 7.  She had at least moderate to moderately severe aortic insufficiency with aortic valve sclerosis without stenosis.  Blood pressure is  stable at 130/50, pulse is regular in the 70s.  HEENT is unremarkable.  There is no JVD.  Lungs are clear.  Rhythm is regular with a 1/6 to 2/6 systolic murmur and 1/6 diastolic murmur.  There is no S3 gallop.  Abdomen is mildly tender in right upper quadrant.  Bowel sounds positive.  There is no significant edema.  ECG shows normal sinus rhythm at 74 with first-degree AV block and PR interval 220 ms.  She has previously noted ST T changes anterolaterally and has Q-wave in lead III.  Clinically, patient appears hemodynamically stable to undergo planned cholecystectomy tomorrow.  LV function is normal without wall motion abnormality.  She continues to have AKI which is improving with IV fluids per internal medicine.  She has gastric ulcer without bleeding on ERCP.  Continue amlodipine and atenolol for blood pressure control with Benicar on hold with AKI.   Lennette Bihari, MD, [facility] 06/20/2023 3:01 PM

## 2023-06-20 NOTE — Progress Notes (Signed)
ABI study completed.   Please see CV Procedures for preliminary results.   , RVT  11:01 AM 06/20/23

## 2023-06-20 NOTE — Plan of Care (Signed)

## 2023-06-20 NOTE — Progress Notes (Signed)
Patient ID: Amy [name], female   DOB: 1936/06/25, 87 y.o.   MRN: [id number] [facility] Surgery Progress Note  1 Day Post-Op  Subjective: CC-  States that she feels like a new woman this morning. Denies any abdominal pain, nausea, vomiting. Tolerating clear liquids.  LFTs and lipase WNL. When I mentioned the possibility of considering cholecystectomy she answered "I am 87 years old, I don't know if that's a good idea."  Objective: Vital signs in last 24 hours: Temp:  [97 F (36.1 C)-97.5 F (36.4 C)] 97.5 F (36.4 C) (08/13 0810) Pulse Rate:  [58-96] 79 (08/13 0810) Resp:  [16-20] 18 (08/13 0810) BP: (48-148)/(26-57) 127/38 (08/13 0810) SpO2:  [96 %-99 %] 99 % (08/13 0810) Weight:  [63.3 kg] 63.3 kg (08/12 1210) Last BM Date : 06/18/23  Intake/Output from previous day: 08/12 0701 - 08/13 0700 In: 960 [P.O.:60; I.V.:700; IV Piggyback:200] Out: 500 [Urine:500] Intake/Output this shift: Total I/O In: -  Out: 250 [Urine:250]  PE: Gen:  Alert, NAD, pleasant Pulm: rate and effort normal Abd: Soft, NT/ND   Lab Results:  Recent Labs    06/18/23 0350 06/19/23 0149  WBC 9.5 9.6  HGB 8.6* 9.3*  HCT 26.3* 28.8*  PLT 170 181   BMET Recent Labs    06/19/23 0149 06/20/23 0307  NA 131* 134*  K 4.2 5.1  CL 108 109  CO2 16* 15*  GLUCOSE 89 132*  BUN 37* 37*  CREATININE 2.80* 2.86*  CALCIUM 7.8* 8.0*   PT/INR No results for input(s): "LABPROT", "INR" in the last 72 hours. CMP     Component Value Date/Time   NA 134 (L) 06/20/2023 0307   NA 136 04/11/2017 1426   K 5.1 06/20/2023 0307   K 4.8 04/11/2017 1426   CL 109 06/20/2023 0307   CO2 15 (L) 06/20/2023 0307   CO2 22 04/11/2017 1426   GLUCOSE 132 (H) 06/20/2023 0307   GLUCOSE 105 04/11/2017 1426   BUN 37 (H) 06/20/2023 0307   BUN 33.9 (H) 04/11/2017 1426   CREATININE 2.86 (H) 06/20/2023 0307   CREATININE 1.9 (H) 04/11/2017 1426   CALCIUM 8.0 (L) 06/20/2023 0307   CALCIUM 9.2 04/11/2017 1426    PROT 5.2 (L) 06/20/2023 0307   PROT 7.1 04/11/2017 1426   PROT 6.5 04/11/2017 1426   ALBUMIN 2.2 (L) 06/20/2023 0307   ALBUMIN 2.7 (L) 04/11/2017 1426   AST 24 06/20/2023 0307   AST 16 04/11/2017 1426   ALT 19 06/20/2023 0307   ALT 16 04/11/2017 1426   ALKPHOS 54 06/20/2023 0307   ALKPHOS 107 04/11/2017 1426   BILITOT 0.4 06/20/2023 0307   BILITOT 0.22 04/11/2017 1426   GFRNONAA 16 (L) 06/20/2023 0307   GFRAA 37 (L) 02/15/2017 0340   Lipase     Component Value Date/Time   LIPASE 39 06/20/2023 0307       Studies/Results: DG Foot Complete Right  Result Date: 06/19/2023 CLINICAL DATA:  Right foot pain EXAM: RIGHT FOOT COMPLETE - 3+ VIEW COMPARISON:  None Available. FINDINGS: Degenerative changes in the interphalangeal joints, first metatarsal-phalangeal joint, and intertarsal joints. No evidence of acute fracture or dislocation. No focal bone lesion or bone destruction. 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 Soft tissues are unremarkable. IMPRESSION: No acute bony abnormalities.  Mild degenerative changes. Electronically Signed   By: Burman Nieves M.D.   On: 06/19/2023 21:47   DG ERCP  Result Date: 06/19/2023 CLINICAL DATA:  ERCP EXAM: ERCP TECHNIQUE:  Multiple spot images obtained with the fluoroscopic device and submitted for interpretation post-procedure. FLUOROSCOPY: Refer to separate report COMPARISON:  None Available. FINDINGS: A total of 13 fluoroscopic spot images obtained during ERCP are submitted for review. Initial images demonstrate a scope overlying the upper abdomen. Surgical clips are present. Wire catheterization and contrast injection of the common bile duct is performed. There appear to be multiple small filling defects best identified on image 7. Balloon sphincterotomy of the ampulla followed by balloon sweeps of the common bile duct appear to be performed. IMPRESSION: ERCP images as described. Refer to dedicated procedure report for full  details. These images were submitted for radiologic interpretation only. Please see the procedural report for the amount of contrast and the fluoroscopy time utilized. Electronically Signed   By: Olive Bass M.D.   On: 06/19/2023 15:55    Anti-infectives: Anti-infectives (From admission, onward)    Start     Dose/Rate Route Frequency Ordered Stop   06/19/23 1215  ampicillin-sulbactam (UNASYN) 1.5 g in sodium chloride 0.9 % 100 mL IVPB  Status:  Discontinued        1.5 g 200 mL/hr over 30 Minutes Intravenous  Once 06/19/23 1214 06/19/23 1547   06/17/23 1400  fosfomycin (MONUROL) packet 3 g        3 g Oral  Once 06/17/23 1300 06/17/23 1720   06/16/23 2315  cefTRIAXone (ROCEPHIN) 1 g in sodium chloride 0.9 % 100 mL IVPB        1 g 200 mL/hr over 30 Minutes Intravenous  Once 06/16/23 2313 06/17/23 0509        Assessment/Plan  Choledocholithiasis - s/p ERCP 8/12: Choledocholithiasis s/p biliary sphincterotomy, sphincteroplasty and stone extraction; nonfilling of the cystic duct/ gallbladder I discussed the possibility of cholecystectomy with the patient pending cardiac evaluation/clearance. She is in favor of managing nonoperatively given her age and multiple medical issues, which I agree with. Hopefully the sphincterotomy will help to keep this from happening again. Of note there was nonfilling of the cystic duct/ gallbladder on ERCP, however patient is having no abdominal pain and she is tolerating liquids without n/v. Recommend advancing diet, and if she tolerates without recurrence of symptoms she could be discharged home.  ID - none currently FEN - CLD VTE - SCDs, per primary Foley - none   I reviewed hospitalist notes, last 24 h vitals and pain scores, last 48 h intake and output, and last 24 h labs and trends.    LOS: 3 days    Franne Forts, [facility] Surgery 06/20/2023, 9:07 AM Please see Amion for pager number during day hours 7:00am-4:30pm

## 2023-06-20 NOTE — Progress Notes (Signed)
Progress Note   Assessment    87 year old female hypertension, GERD and iron deficiency anemia presenting with AKI, choledocholithiasis now status post ERCP with stone extraction   Recommendations   1.  Cholelithiasis/choledocholithiasis --successful ERCP yesterday.  No evidence for postprocedure complication.  Surgery is following and planning cholecystectomy -- Cholecystectomy per surgery, GI will sign off, call if questions  2.  8 mm gastric ulcer --Dr. Chales Abrahams will follow-up biopsies.  Continue daily PPI.   Chief Complaint   Patient feels well Had a little bit of lower abdominal pain this morning but that passed with Tylenol Able to eat food without nausea vomiting or pain Has decided to have cholecystectomy  Vital signs in last 24 hours: Temp:  [97 F (36.1 C)-97.7 F (36.5 C)] 97.7 F (36.5 C) (08/13 1222) Pulse Rate:  [58-96] 71 (08/13 1222) Resp:  [16-19] 18 (08/13 0810) BP: (48-149)/(26-51) 149/50 (08/13 1222) SpO2:  [96 %-99 %] 98 % (08/13 1222) Last BM Date : 06/19/23 (per pt)  Gen: awake, alert, NAD HEENT: anicteric  CV: RRR, no mrg Pulm: CTA b/l Abd: soft, NT/ND, +BS throughout Ext: no c/c/e Neuro: nonfocal   Intake/Output from previous day: 08/12 0701 - 08/13 0700 In: 960 [P.O.:60; I.V.:700; IV Piggyback:200] Out: 500 [Urine:500] Intake/Output this shift: Total I/O In: -  Out: 250 [Urine:250]  Lab Results: Recent Labs    06/18/23 0350 06/19/23 0149  WBC 9.5 9.6  HGB 8.6* 9.3*  HCT 26.3* 28.8*  PLT 170 181   BMET Recent Labs    06/18/23 0350 06/19/23 0149 06/20/23 0307  NA 130* 131* 134*  K 4.3 4.2 5.1  CL 106 108 109  CO2 15* 16* 15*  GLUCOSE 88 89 132*  BUN 43* 37* 37*  CREATININE 3.05* 2.80* 2.86*  CALCIUM 7.7* 7.8* 8.0*   LFT Recent Labs    06/20/23 0307  PROT 5.2*  ALBUMIN 2.2*  AST 24  ALT 19  ALKPHOS 54  BILITOT 0.4  BILIDIR 0.1  IBILI 0.3   Studies/Results: VAS [place] ABI WITH/WO TBI  Result Date:  06/20/2023  LOWER EXTREMITY DOPPLER STUDY Patient Name:  Amy [name]  Date of Exam:   06/20/2023 Medical Rec #: [id number]       Accession #:    [id number] Date of Birth: Sep 26, 1936       Patient Gender: F Patient Age:   15 years Exam Location:  [facility] Procedure:      VAS [place] ABI WITH/WO TBI Referring Phys: Calvert Cantor --------------------------------------------------------------------------------  Indications: Rest pain. High Risk Factors: Hypertension, past history of smoking.  Comparison Study: No previous study. Performing Technologist: McKayla Maag RVT, [place]  Examination Guidelines: A complete evaluation includes at minimum, Doppler waveform signals and systolic blood pressure reading at the level of bilateral brachial, anterior tibial, and posterior tibial arteries, when vessel segments are accessible. Bilateral testing is considered an integral part of a complete examination. Photoelectric Plethysmograph (PPG) waveforms and toe systolic pressure readings are included as required and additional duplex testing as needed. Limited examinations for reoccurring indications may be performed as noted.  ABI Findings: +---------+--------------+-----+-------------------+---------------------------+ Right    Rt Pressure   IndexWaveform           Comment                              (mmHg)                                                            +---------+--------------+-----+-------------------+---------------------------+  Brachial 154                biphasic                                       +---------+--------------+-----+-------------------+---------------------------+ PTA      255           1.66 monophasic         Non-compressible            +---------+--------------+-----+-------------------+---------------------------+ DP                          dampened monophasicWaveform obtained with 2D                                                  imaging. No pressure                                                        obtained.                   +---------+--------------+-----+-------------------+---------------------------+ Great Toe39            0.25 Abnormal                                       +---------+--------------+-----+-------------------+---------------------------+ +---------+------------------+-----+----------+----------------+ Left     Lt Pressure (mmHg)IndexWaveform  Comment          +---------+------------------+-----+----------+----------------+ PTA      255               1.66 monophasicNon-compressible +---------+------------------+-----+----------+----------------+ DP       96                0.62 monophasic                 +---------+------------------+-----+----------+----------------+ Great Toe57                0.37 Abnormal                   +---------+------------------+-----+----------+----------------+ +-------+----------------+-----------+------------+------------+ ABI/TBIToday's ABI     Today's TBIPrevious ABIPrevious TBI +-------+----------------+-----------+------------+------------+ Right  Non-compressible0.25                                +-------+----------------+-----------+------------+------------+ Left   Non-compressible0.37                                +-------+----------------+-----------+------------+------------+   Summary: Right: Resting right ankle-brachial index indicates noncompressible right lower extremity arteries. The right toe-brachial index is abnormal. Left: Resting left ankle-brachial index indicates noncompressible left lower extremity arteries. The left toe-brachial index is abnormal. *See table(s) above for measurements and observations.     Preliminary    DG Foot Complete Right  Result Date: 06/19/2023 CLINICAL DATA:  Right foot pain EXAM: RIGHT FOOT COMPLETE - 3+ VIEW COMPARISON:  None Available. FINDINGS: Degenerative changes in the interphalangeal  joints,  first metatarsal-phalangeal joint, and intertarsal joints. No evidence of acute fracture or dislocation. No focal bone lesion or bone destruction. 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 Soft tissues are unremarkable. IMPRESSION: No acute bony abnormalities.  Mild degenerative changes. Electronically Signed   By: Burman Nieves M.D.   On: 06/19/2023 21:47   DG ERCP  Result Date: 06/19/2023 CLINICAL DATA:  ERCP EXAM: ERCP TECHNIQUE: Multiple spot images obtained with the fluoroscopic device and submitted for interpretation post-procedure. FLUOROSCOPY: Refer to separate report COMPARISON:  None Available. FINDINGS: A total of 13 fluoroscopic spot images obtained during ERCP are submitted for review. Initial images demonstrate a scope overlying the upper abdomen. Surgical clips are present. Wire catheterization and contrast injection of the common bile duct is performed. There appear to be multiple small filling defects best identified on image 7. Balloon sphincterotomy of the ampulla followed by balloon sweeps of the common bile duct appear to be performed. IMPRESSION: ERCP images as described. Refer to dedicated procedure report for full details. These images were submitted for radiologic interpretation only. Please see the procedural report for the amount of contrast and the fluoroscopy time utilized. Electronically Signed   By: Olive Bass M.D.   On: 06/19/2023 15:55      LOS: 3 days   Amy Fiedler, MD 06/20/2023, 1:48 PM See Loretha Stapler, [facility], to contact our on call provider

## 2023-06-20 NOTE — Progress Notes (Signed)
TRH night cross cover note:   I was notified by RN of the patient's request for a sleep aid. I subsequently placed order for prn melatonin for insomnia.     Justin Howerter, DO Hospitalist  

## 2023-06-20 NOTE — Evaluation (Signed)
Occupational Therapy Evaluation Patient Details Name: Amy [name] MRN: [id number] DOB: 02-Dec-1935 Today's Date: 06/20/2023   History of Present Illness 87 yo female s/p 8/12 ERCP, acute chronic kidney injury with hyponatremia and dehydration. Pending cholecystectomy  PMH HTN GERD CKD III   Clinical Impression   PT s/p 8/12 ERCP and pending cholecystectomy surgery. Pt currently with functional limitiations due to the deficits listed below (see OT problem list). Pt lives at home alone with daughter doing IADLS such as grocery shopping / bill paying. Pt currently requires (A) with basic transfer and use of RW. Pt reports minimal support at home. Pt could benefit from increase strength prior to return home alone.  Pt will benefit from skilled OT to increase their independence and safety with adls and balance to allow discharge skilled inpatient follow up therapy, <3 hours/day. .        If plan is discharge home, recommend the following: A lot of help with walking and/or transfers;A lot of help with bathing/dressing/bathroom;Assist for transportation    Functional Status Assessment  Patient has had a recent decline in their functional status and demonstrates the ability to make significant improvements in function in a reasonable and predictable amount of time.  Equipment Recommendations  None recommended by OT    Recommendations for Other Services       Precautions / Restrictions Precautions Precautions: Fall      Mobility Bed Mobility Overal bed mobility: Needs Assistance Bed Mobility: Rolling, Supine to Sit, Sit to Supine Rolling: Contact guard assist   Supine to sit: Min assist Sit to supine: Mod assist   General bed mobility comments: pt needs (A) to scoot to the EOB and static sit. Pt requires (A) of bil LE onto bed surface for return supine.    Transfers Overall transfer level: Needs assistance Equipment used: Rolling walker (2 wheels) Transfers: Sit to/from  Stand Sit to Stand: Min assist Stand pivot transfers: Max assist         General transfer comment: pt is able to power up but unable to sustain initially. pt with increased time and cues able to static stand . pt with stand pivot requires mod to max (A)      Balance Overall balance assessment: Needs assistance Sitting-balance support: Bilateral upper extremity supported, Feet supported Sitting balance-Leahy Scale: Fair     Standing balance support: Bilateral upper extremity supported, During functional activity, Reliant on assistive device for balance Standing balance-Leahy Scale: Poor                             ADL either performed or assessed with clinical judgement   ADL Overall ADL's : Needs assistance/impaired Eating/Feeding: Set up   Grooming: Set up;Sitting   Upper Body Bathing: Contact guard assist;Sitting   Lower Body Bathing: Maximal assistance   Upper Body Dressing : Contact guard assist;Sitting   Lower Body Dressing: Maximal assistance;Sitting/lateral leans   Toilet Transfer: Moderate assistance;BSC/3in1;Stand-pivot Toilet Transfer Details (indicate cue type and reason): bulked with tranfser. required return to sittng due to LE weakness and incontinence of stool. pt transfered to [facility] and voiding. pt able to stand for peri care after sitting on BSC in RW with min ()A Toileting- Clothing Manipulation and Hygiene: Maximal assistance         General ADL Comments: pt states "i had no idea i am this weak now"     Vision Baseline Vision/History: 1 Wears glasses Ability to See  in Adequate Light: 0 Adequate Patient Visual Report: No change from baseline Vision Assessment?: No apparent visual deficits     Perception         Praxis         Pertinent Vitals/Pain Pain Assessment Pain Assessment: No/denies pain     Extremity/Trunk Assessment Upper Extremity Assessment Upper Extremity Assessment: Overall WFL for tasks assessed   Lower  Extremity Assessment Lower Extremity Assessment: Generalized weakness   Cervical / Trunk Assessment Cervical / Trunk Assessment: Kyphotic   Communication Communication Communication: Hearing impairment   Cognition Arousal: Alert Behavior During Therapy: WFL for tasks assessed/performed Overall Cognitive Status: Within Functional Limits for tasks assessed                                       General Comments  incontinence of stool and urine noted.    Exercises     Shoulder Instructions      Home Living Family/patient expects to be discharged to:: Private residence Living Arrangements: Alone Available Help at Discharge: Family;Available PRN/intermittently Type of Home: House Home Access: Stairs to enter [employer] of Steps: 1 Entrance Stairs-Rails: Right Home Layout: One level     Bathroom Shower/Tub: Chief Strategy Officer: Standard     Home Equipment: Architectural technologist (4 wheels);Toilet riser;Shower seat   Additional Comments: reports she doesnt have help but with further discussion her daughter Para March does grocery shopping 1 time per month and pays all bills. pt reports she does not see other daughter. pt relies on elderly neighbor 56 yo with cancer who is coming to see her less frequenct lately due to her own health concerns. pt reports preacher at church comes to her home due to inability to make it to church      Prior Functioning/Environment Prior Level of Function : Independent/Modified Independent               ADLs Comments: indep prior to admission per patient.        OT Problem List: Decreased strength;Decreased activity tolerance;Impaired balance (sitting and/or standing);Decreased safety awareness;Decreased knowledge of use of DME or AE;Decreased knowledge of precautions;Obesity      OT Treatment/Interventions: Self-care/ADL training;Therapeutic exercise;DME and/or AE instruction;Therapeutic  activities;Patient/family education;Balance training;Energy conservation    OT Goals(Current goals can be found in the care plan section) Acute Rehab OT Goals Patient Stated Goal: to have surgery OT Goal Formulation: With patient Time For Goal Achievement: 07/04/23 Potential to Achieve Goals: Good  OT Frequency: Min 1X/week    Co-evaluation              AM-PAC OT "6 Clicks" Daily Activity     Outcome Measure Help from another person eating meals?: A Little Help from another person taking care of personal grooming?: A Little Help from another person toileting, which includes using toliet, bedpan, or urinal?: A Lot Help from another person bathing (including washing, rinsing, drying)?: A Lot Help from another person to put on and taking off regular upper body clothing?: A Little Help from another person to put on and taking off regular lower body clothing?: A Lot 6 Click Score: 15   End of Session Equipment Utilized During Treatment: Gait belt;Rolling walker (2 wheels) Nurse Communication: Mobility status;Precautions  Activity Tolerance: Patient tolerated treatment well Patient left: in bed;with call bell/phone within reach;Other (comment) (tech in room to (A) with purewick placement alarm not  set due to pending movement in the bed needed)  OT Visit Diagnosis: Unsteadiness on feet (R26.81);Muscle weakness (generalized) (M62.81)                Time: 0865-7846 OT Time Calculation (min): 21 min Charges:  OT General Charges $OT Visit: 1 Visit OT Evaluation $OT Eval Moderate Complexity: 1 Mod   Brynn, OTR/L  Acute Rehabilitation Services Office: [phone] .   Mateo Flow 06/20/2023, 11:55 AM

## 2023-06-21 ENCOUNTER — Encounter (HOSPITAL_COMMUNITY): Payer: Self-pay | Admitting: Family Medicine

## 2023-06-21 ENCOUNTER — Other Ambulatory Visit: Payer: Self-pay

## 2023-06-21 ENCOUNTER — Inpatient Hospital Stay (HOSPITAL_COMMUNITY): Payer: Medicare HMO | Admitting: Critical Care Medicine

## 2023-06-21 ENCOUNTER — Encounter (HOSPITAL_COMMUNITY)
Admission: EM | Disposition: A | Discharge status: Skilled Nursing Facility | Payer: Self-pay | Source: Ambulatory Visit | Attending: Internal Medicine

## 2023-06-21 DIAGNOSIS — K8070 Calculus of gallbladder and bile duct without cholecystitis without obstruction: Secondary | ICD-10-CM

## 2023-06-21 DIAGNOSIS — I10 Essential (primary) hypertension: Secondary | ICD-10-CM | POA: Diagnosis not present

## 2023-06-21 DIAGNOSIS — Z87891 Personal history of nicotine dependence: Secondary | ICD-10-CM

## 2023-06-21 DIAGNOSIS — N179 Acute kidney failure, unspecified: Secondary | ICD-10-CM | POA: Diagnosis not present

## 2023-06-21 DIAGNOSIS — I2510 Atherosclerotic heart disease of native coronary artery without angina pectoris: Secondary | ICD-10-CM

## 2023-06-21 HISTORY — PX: CHOLECYSTECTOMY: SHX55

## 2023-06-21 SURGERY — LAPAROSCOPIC CHOLECYSTECTOMY
Anesthesia: General | Site: Abdomen

## 2023-06-21 MED ORDER — ORAL CARE MOUTH RINSE: 15.0000 mL | Freq: Once | OROMUCOSAL | Status: AC

## 2023-06-21 MED ORDER — SODIUM BICARBONATE 8.4 % IV SOLN
INTRAVENOUS | Status: AC
  Filled 2023-06-21 (×2): qty 1000

## 2023-06-21 MED ORDER — BUPIVACAINE-EPINEPHRINE (PF) 0.25% -1:200000 IJ SOLN
INTRAMUSCULAR | Status: AC
  Filled 2023-06-21: qty 30

## 2023-06-21 MED ORDER — FENTANYL CITRATE (PF) 100 MCG/2ML IJ SOLN
25.0000 ug | INTRAMUSCULAR | Status: DC | PRN
  Administered 2023-06-21: 25 ug via INTRAVENOUS

## 2023-06-21 MED ORDER — DEXAMETHASONE SODIUM PHOSPHATE 10 MG/ML IJ SOLN
INTRAMUSCULAR | Status: DC | PRN
  Administered 2023-06-21: 4 mg via INTRAVENOUS

## 2023-06-21 MED ORDER — 0.9 % SODIUM CHLORIDE (POUR BTL) OPTIME
TOPICAL | Status: DC | PRN
  Administered 2023-06-21: 1000 mL

## 2023-06-21 MED ORDER — CHLORHEXIDINE GLUCONATE 0.12 % MT SOLN: 15.0000 mL | Freq: Once | OROMUCOSAL | Status: AC

## 2023-06-21 MED ORDER — BUPIVACAINE-EPINEPHRINE 0.25% -1:200000 IJ SOLN
INTRAMUSCULAR | Status: DC | PRN
  Administered 2023-06-21: 8 mL

## 2023-06-21 MED ORDER — OXYCODONE HCL 5 MG/5ML PO SOLN: 5.0000 mg | Freq: Once | ORAL | Status: DC | PRN

## 2023-06-21 MED ORDER — OXYCODONE HCL 5 MG PO TABS: 5.0000 mg | ORAL_TABLET | Freq: Once | ORAL | Status: DC | PRN

## 2023-06-21 MED ORDER — FENTANYL CITRATE (PF) 250 MCG/5ML IJ SOLN
INTRAMUSCULAR | Status: AC
  Filled 2023-06-21: qty 5

## 2023-06-21 MED ORDER — ACETAMINOPHEN 650 MG RE SUPP: 650.0000 mg | Freq: Four times a day (QID) | RECTAL | Status: DC

## 2023-06-21 MED ORDER — LIDOCAINE 2% (20 MG/ML) 5 ML SYRINGE
INTRAMUSCULAR | Status: AC
  Filled 2023-06-21: qty 10

## 2023-06-21 MED ORDER — ONDANSETRON HCL 4 MG/2ML IJ SOLN
4.0000 mg | Freq: Four times a day (QID) | INTRAMUSCULAR | Status: DC | PRN
  Administered 2023-06-23: 4 mg via INTRAVENOUS
  Filled 2023-06-21: qty 2

## 2023-06-21 MED ORDER — CEFAZOLIN SODIUM-DEXTROSE 2-4 GM/100ML-% IV SOLN
INTRAVENOUS | Status: AC
  Filled 2023-06-21: qty 100

## 2023-06-21 MED ORDER — LACTATED RINGERS IV SOLN: INTRAVENOUS | Status: DC

## 2023-06-21 MED ORDER — DOCUSATE SODIUM 100 MG PO CAPS
100.0000 mg | ORAL_CAPSULE | Freq: Two times a day (BID) | ORAL | Status: DC
  Administered 2023-06-21 – 2023-06-25 (×9): 100 mg via ORAL
  Filled 2023-06-21 (×9): qty 1

## 2023-06-21 MED ORDER — PROPOFOL 10 MG/ML IV BOLUS
INTRAVENOUS | Status: AC
  Filled 2023-06-21: qty 20

## 2023-06-21 MED ORDER — MORPHINE SULFATE (PF) 2 MG/ML IV SOLN: 2.0000 mg | INTRAVENOUS | Status: DC | PRN

## 2023-06-21 MED ORDER — ACETAMINOPHEN 325 MG PO TABS
650.0000 mg | ORAL_TABLET | Freq: Four times a day (QID) | ORAL | Status: DC
  Administered 2023-06-21 – 2023-06-28 (×25): 650 mg via ORAL
  Filled 2023-06-21 (×26): qty 2

## 2023-06-21 MED ORDER — SUGAMMADEX SODIUM 200 MG/2ML IV SOLN
INTRAVENOUS | Status: DC | PRN
  Administered 2023-06-21: 128 mg via INTRAVENOUS

## 2023-06-21 MED ORDER — CHLORHEXIDINE GLUCONATE 0.12 % MT SOLN
OROMUCOSAL | Status: AC
  Administered 2023-06-21: 15 mL via OROMUCOSAL
  Filled 2023-06-21: qty 15

## 2023-06-21 MED ORDER — LIDOCAINE 2% (20 MG/ML) 5 ML SYRINGE
INTRAMUSCULAR | Status: DC | PRN
  Administered 2023-06-21: 40 mg via INTRAVENOUS

## 2023-06-21 MED ORDER — ONDANSETRON HCL 4 MG/2ML IJ SOLN: 4.0000 mg | Freq: Four times a day (QID) | INTRAMUSCULAR | Status: DC | PRN

## 2023-06-21 MED ORDER — PHENYLEPHRINE 80 MCG/ML (10ML) SYRINGE FOR IV PUSH (FOR BLOOD PRESSURE SUPPORT)
PREFILLED_SYRINGE | INTRAVENOUS | Status: AC
  Filled 2023-06-21: qty 20

## 2023-06-21 MED ORDER — FENTANYL CITRATE (PF) 100 MCG/2ML IJ SOLN
INTRAMUSCULAR | Status: AC
  Administered 2023-06-21: 25 ug via INTRAVENOUS
  Filled 2023-06-21: qty 2

## 2023-06-21 MED ORDER — SODIUM CHLORIDE 0.9 % IV SOLN: INTRAVENOUS | Status: DC

## 2023-06-21 MED ORDER — ROCURONIUM BROMIDE 10 MG/ML (PF) SYRINGE
PREFILLED_SYRINGE | INTRAVENOUS | Status: DC | PRN
  Administered 2023-06-21: 50 mg via INTRAVENOUS
  Administered 2023-06-21: 10 mg via INTRAVENOUS

## 2023-06-21 MED ORDER — FENTANYL CITRATE (PF) 250 MCG/5ML IJ SOLN
INTRAMUSCULAR | Status: DC | PRN
  Administered 2023-06-21 (×2): 50 ug via INTRAVENOUS
  Administered 2023-06-21: 25 ug via INTRAVENOUS
  Administered 2023-06-21: 50 ug via INTRAVENOUS

## 2023-06-21 MED ORDER — ACETAMINOPHEN 500 MG PO TABS
1000.0000 mg | ORAL_TABLET | Freq: Once | ORAL | Status: AC
  Administered 2023-06-21: 1000 mg via ORAL
  Filled 2023-06-21: qty 2

## 2023-06-21 MED ORDER — PHENYLEPHRINE HCL-NACL 20-0.9 MG/250ML-% IV SOLN
INTRAVENOUS | Status: DC | PRN
  Administered 2023-06-21: 50 ug/min via INTRAVENOUS

## 2023-06-21 MED ORDER — PROPOFOL 10 MG/ML IV BOLUS
INTRAVENOUS | Status: DC | PRN
Start: 2023-06-21 — End: 2023-06-21
  Administered 2023-06-21: 70 mg via INTRAVENOUS

## 2023-06-21 MED ORDER — PHENYLEPHRINE 80 MCG/ML (10ML) SYRINGE FOR IV PUSH (FOR BLOOD PRESSURE SUPPORT)
PREFILLED_SYRINGE | INTRAVENOUS | Status: DC | PRN
Start: 2023-06-21 — End: 2023-06-21
  Administered 2023-06-21 (×4): 80 ug via INTRAVENOUS

## 2023-06-21 MED ORDER — ONDANSETRON HCL 4 MG/2ML IJ SOLN
INTRAMUSCULAR | Status: DC | PRN
  Administered 2023-06-21: 4 mg via INTRAVENOUS

## 2023-06-21 MED ORDER — ROCURONIUM BROMIDE 10 MG/ML (PF) SYRINGE
PREFILLED_SYRINGE | INTRAVENOUS | Status: AC
  Filled 2023-06-21: qty 20

## 2023-06-21 MED ORDER — TRAMADOL HCL 50 MG PO TABS
50.0000 mg | ORAL_TABLET | ORAL | Status: DC | PRN
  Administered 2023-06-22 – 2023-06-23 (×5): 50 mg via ORAL
  Filled 2023-06-21 (×5): qty 1

## 2023-06-21 MED ORDER — SODIUM CHLORIDE 0.9 % IR SOLN
Status: DC | PRN
  Administered 2023-06-21: 1000 mL

## 2023-06-21 MED ORDER — FENTANYL CITRATE (PF) 100 MCG/2ML IJ SOLN
25.0000 ug | INTRAMUSCULAR | Status: DC | PRN
  Administered 2023-06-21: 50 ug via INTRAVENOUS

## 2023-06-21 SURGICAL SUPPLY — 49 items
ADH SKN CLS APL DERMABOND .7 (GAUZE/BANDAGES/DRESSINGS) ×1
APL PRP STRL LF DISP 70% ISPRP (MISCELLANEOUS) ×1
APPLIER CLIP 5 13 M/L LIGAMAX5 (MISCELLANEOUS) ×1
APR CLP MED LRG 5 ANG JAW (MISCELLANEOUS) ×1
BAG COUNTER SPONGE SURGICOUNT (BAG) ×2 IMPLANT
BAG SPNG CNTER NS LX DISP (BAG)
BIOPATCH RED 1 DISK 7.0 (GAUZE/BANDAGES/DRESSINGS) IMPLANT
BLADE CLIPPER SURG (BLADE) IMPLANT
CANISTER SUCT 3000ML PPV (MISCELLANEOUS) ×2 IMPLANT
CHLORAPREP W/TINT 26 (MISCELLANEOUS) ×2 IMPLANT
CLIP APPLIE 5 13 M/L LIGAMAX5 (MISCELLANEOUS) ×2 IMPLANT
CNTNR URN SCR LID CUP LEK RST (MISCELLANEOUS) IMPLANT
CONT SPEC 4OZ STRL OR WHT (MISCELLANEOUS) ×1
COVER SURGICAL LIGHT HANDLE (MISCELLANEOUS) ×2 IMPLANT
DERMABOND ADVANCED .7 DNX12 (GAUZE/BANDAGES/DRESSINGS) ×2 IMPLANT
DRAIN CHANNEL 19F RND (DRAIN) IMPLANT
DRSG TEGADERM 4X4.75 (GAUZE/BANDAGES/DRESSINGS) IMPLANT
ELECT REM PT RETURN 9FT ADLT (ELECTROSURGICAL) ×1
ELECTRODE REM PT RTRN 9FT ADLT (ELECTROSURGICAL) ×2 IMPLANT
EVACUATOR SILICONE 100CC (DRAIN) IMPLANT
GLOVE BIO SURGEON STRL SZ 6 (GLOVE) ×2 IMPLANT
GLOVE INDICATOR 6.5 STRL GRN (GLOVE) ×2 IMPLANT
GOWN STRL REUS W/ TWL LRG LVL3 (GOWN DISPOSABLE) ×6 IMPLANT
GOWN STRL REUS W/TWL LRG LVL3 (GOWN DISPOSABLE) ×3
GRASPER SUT TROCAR 14GX15 (MISCELLANEOUS) ×2 IMPLANT
IRRIG SUCT STRYKERFLOW 2 WTIP (MISCELLANEOUS) ×1
IRRIGATION SUCT STRKRFLW 2 WTP (MISCELLANEOUS) ×2 IMPLANT
KIT BASIN OR (CUSTOM PROCEDURE TRAY) ×2 IMPLANT
KIT IMAGING PINPOINTPAQ (MISCELLANEOUS) IMPLANT
KIT TURNOVER KIT B (KITS) ×2 IMPLANT
NDL INSUFFLATION 14GA 120MM (NEEDLE) ×2 IMPLANT
NEEDLE INSUFFLATION 14GA 120MM (NEEDLE) ×1 IMPLANT
NS IRRIG 1000ML POUR BTL (IV SOLUTION) ×2 IMPLANT
PAD ARMBOARD 7.5X6 YLW CONV (MISCELLANEOUS) ×2 IMPLANT
SCISSORS LAP 5X35 DISP (ENDOMECHANICALS) ×2 IMPLANT
SET TUBE SMOKE EVAC HIGH FLOW (TUBING) ×2 IMPLANT
SLEEVE Z-THREAD 5X100MM (TROCAR) ×4 IMPLANT
SPECIMEN JAR SMALL (MISCELLANEOUS) ×2 IMPLANT
SUT ETHILON 2 0 FS 18 (SUTURE) IMPLANT
SUT MNCRL AB 4-0 PS2 18 (SUTURE) ×2 IMPLANT
SYS BAG RETRIEVAL 10MM (BASKET) ×1
SYSTEM BAG RETRIEVAL 10MM (BASKET) ×2 IMPLANT
TOWEL GREEN STERILE (TOWEL DISPOSABLE) IMPLANT
TOWEL GREEN STERILE FF (TOWEL DISPOSABLE) ×2 IMPLANT
TRAY LAPAROSCOPIC MC (CUSTOM PROCEDURE TRAY) ×2 IMPLANT
TROCAR 11X100 Z THREAD (TROCAR) ×2 IMPLANT
TROCAR Z-THREAD OPTICAL 5X100M (TROCAR) ×2 IMPLANT
WARMER LAPAROSCOPE (MISCELLANEOUS) ×2 IMPLANT
WATER STERILE IRR 1000ML POUR (IV SOLUTION) ×2 IMPLANT

## 2023-06-21 NOTE — Progress Notes (Signed)
Patient ID: Amy [name], female   DOB: 1936-08-30, 87 y.o.   MRN: [id number] [facility] Progress Note  2 Days Post-Op  Subjective: CC-  Had some right upper quadrant and epigastric pain overnight which is now better  Objective: Vital signs in last 24 hours: Temp:  [97.3 F (36.3 C)-98.5 F (36.9 C)] 97.8 F (36.6 C) (08/14 0742) Pulse Rate:  [71-86] 78 (08/14 0742) Resp:  [16-19] 19 (08/14 0742) BP: (139-183)/(50-73) 139/52 (08/14 0742) SpO2:  [97 %-98 %] 98 % (08/14 0742) Last BM Date : 06/19/23 (pt stated)  Intake/Output from previous day: 08/13 0701 - 08/14 0700 In: 1633.1 [P.O.:336; I.V.:1297.1] Out: 1150 [Urine:1150] Intake/Output this shift: Total I/O In: -  Out: 150 [Urine:150]  PE: Gen:  Alert, NAD, pleasant Pulm: rate and effort normal Abd: Soft, NT/ND   Lab Results:  Recent Labs    06/19/23 0149 06/21/23 0641  WBC 9.6 13.4*  HGB 9.3* 8.9*  HCT 28.8* 27.2*  PLT 181 214   BMET Recent Labs    06/20/23 0307 06/21/23 0641  NA 134* 136  K 5.1 3.6  CL 109 103  CO2 15* 24  GLUCOSE 132* 139*  BUN 37* 31*  CREATININE 2.86* 2.74*  CALCIUM 8.0* 7.9*   PT/INR No results for input(s): "LABPROT", "INR" in the last 72 hours. CMP     Component Value Date/Time   NA 136 06/21/2023 0641   NA 136 04/11/2017 1426   K 3.6 06/21/2023 0641   K 4.8 04/11/2017 1426   CL 103 06/21/2023 0641   CO2 24 06/21/2023 0641   CO2 22 04/11/2017 1426   GLUCOSE 139 (H) 06/21/2023 0641   GLUCOSE 105 04/11/2017 1426   BUN 31 (H) 06/21/2023 0641   BUN 33.9 (H) 04/11/2017 1426   CREATININE 2.74 (H) 06/21/2023 0641   CREATININE 1.9 (H) 04/11/2017 1426   CALCIUM 7.9 (L) 06/21/2023 0641   CALCIUM 9.2 04/11/2017 1426   PROT 5.2 (L) 06/20/2023 0307   PROT 7.1 04/11/2017 1426   PROT 6.5 04/11/2017 1426   ALBUMIN 2.2 (L) 06/20/2023 0307   ALBUMIN 2.7 (L) 04/11/2017 1426   AST 24 06/20/2023 0307   AST 16 04/11/2017 1426   ALT 19 06/20/2023 0307   ALT 16  04/11/2017 1426   ALKPHOS 54 06/20/2023 0307   ALKPHOS 107 04/11/2017 1426   BILITOT 0.4 06/20/2023 0307   BILITOT 0.22 04/11/2017 1426   GFRNONAA 16 (L) 06/21/2023 0641   GFRAA 37 (L) 02/15/2017 0340   Lipase     Component Value Date/Time   LIPASE 39 06/20/2023 0307       Studies/Results: VAS [place] ABI WITH/WO TBI  Result Date: 06/20/2023  LOWER EXTREMITY DOPPLER STUDY Patient Name:  Amy [name]  Date of Exam:   06/20/2023 Medical Rec #: [id number]       Accession #:    [id number] Date of Birth: 1936/05/12       Patient Gender: F Patient Age:   16 years Exam Location:  [facility] Procedure:      VAS [place] ABI WITH/WO TBI Referring Phys: Calvert Cantor --------------------------------------------------------------------------------  Indications: Rest pain. High Risk Factors: Hypertension, past history of smoking.  Comparison Study: No previous study. Performing Technologist: McKayla Maag RVT, [place]  Examination Guidelines: A complete evaluation includes at minimum, Doppler waveform signals and systolic blood pressure reading at the level of bilateral brachial, anterior tibial, and posterior tibial arteries, when vessel segments are accessible. Bilateral testing is considered an  integral part of a complete examination. Photoelectric Plethysmograph (PPG) waveforms and toe systolic pressure readings are included as required and additional duplex testing as needed. Limited examinations for reoccurring indications may be performed as noted.  ABI Findings: +---------+--------------+-----+-------------------+---------------------------+ Right    Rt Pressure   IndexWaveform           Comment                              (mmHg)                                                            +---------+--------------+-----+-------------------+---------------------------+ Brachial 154                biphasic                                        +---------+--------------+-----+-------------------+---------------------------+ PTA      255           1.66 monophasic         Non-compressible            +---------+--------------+-----+-------------------+---------------------------+ DP                          dampened monophasicWaveform obtained with 2D                                                  imaging. No pressure                                                       obtained.                   +---------+--------------+-----+-------------------+---------------------------+ Great Toe39            0.25 Abnormal                                       +---------+--------------+-----+-------------------+---------------------------+ +---------+------------------+-----+----------+----------------+ Left     Lt Pressure (mmHg)IndexWaveform  Comment          +---------+------------------+-----+----------+----------------+ PTA      255               1.66 monophasicNon-compressible +---------+------------------+-----+----------+----------------+ DP       96                0.62 monophasic                 +---------+------------------+-----+----------+----------------+ Great Toe57                0.37 Abnormal                   +---------+------------------+-----+----------+----------------+ +-------+----------------+-----------+------------+------------+ ABI/TBIToday's ABI     Today's TBIPrevious ABIPrevious TBI +-------+----------------+-----------+------------+------------+ Right  Non-compressible0.25                                +-------+----------------+-----------+------------+------------+  Left   Non-compressible0.37                                +-------+----------------+-----------+------------+------------+  Summary: Right: Resting right ankle-brachial index indicates noncompressible right lower extremity arteries. The right toe-brachial index is abnormal. Left: Resting left  ankle-brachial index indicates noncompressible left lower extremity arteries. The left toe-brachial index is abnormal. *See table(s) above for measurements and observations.  Electronically signed by Coral Else MD on 06/20/2023 at 9:06:27 PM.    Final    ECHOCARDIOGRAM COMPLETE  Result Date: 06/20/2023    ECHOCARDIOGRAM REPORT   Patient Name:   Amy [name] Date of Exam: 06/20/2023 Medical Rec #:  [id number]      Height:       65.0 in Accession #:    [id number]     Weight:       139.6 lb Date of Birth:  1936/06/08      BSA:          1.698 m Patient Age:    87 years       BP:           149/58 mmHg Patient Gender: F              HR:           82 bpm. Exam Location:  Inpatient Procedure: 2D Echo, Cardiac Doppler and Color Doppler Indications:    Pre-Op  History:        Patient has no prior history of Echocardiogram examinations.                 CAD; Risk Factors:Hypertension.  Sonographer:    Darlys Gales Referring Phys: Leone Brand IMPRESSIONS  1. Left ventricular ejection fraction, by estimation, is 60 to 65%. The left ventricle has normal function. The left ventricle has no regional wall motion abnormalities. Left ventricular diastolic parameters are indeterminate.  2. Right ventricular systolic function is normal. The right ventricular size is normal.  3. The mitral valve is degenerative. No evidence of mitral valve regurgitation. Mild mitral stenosis. The mean mitral valve gradient is 7.0 mmHg with average heart rate of 82 bpm. Severe mitral annular calcification.  4. The aortic valve was not well visualized. Aortic valve regurgitation is moderate to severe. Aortic valve sclerosis is present, with no evidence of aortic valve stenosis. Aortic regurgitation PHT measures 300 msec. Comparison(s): No prior Echocardiogram. FINDINGS  Left Ventricle: Left ventricular ejection fraction, by estimation, is 60 to 65%. The left ventricle has normal function. The left ventricle has no regional wall motion  abnormalities. The left ventricular internal cavity size was normal in size. There is  no left ventricular hypertrophy. Left ventricular diastolic parameters are indeterminate. Right Ventricle: The right ventricular size is normal. No increase in right ventricular wall thickness. Right ventricular systolic function is normal. Left Atrium: Left atrial size was normal in size. Right Atrium: Right atrial size was normal in size. Pericardium: Trivial pericardial effusion is present. Mitral Valve: 2D Mitral valve area 3.48 cm2. The mitral valve is degenerative in appearance. Severe mitral annular calcification. No evidence of mitral valve regurgitation. Mild mitral valve stenosis. MV peak gradient, 10.2 mmHg. The mean mitral valve gradient is 7.0 mmHg with average heart rate of 82 bpm. Tricuspid Valve: The tricuspid valve is normal in structure. Tricuspid valve regurgitation is not demonstrated. No evidence of tricuspid stenosis. Aortic Valve: The aortic valve was not well visualized.  Aortic valve regurgitation is moderate to severe. Aortic regurgitation PHT measures 300 msec. Aortic valve sclerosis is present, with no evidence of aortic valve stenosis. Aortic valve mean gradient  measures 6.0 mmHg. Aortic valve peak gradient measures 10.4 mmHg. Aortic valve area, by VTI measures 2.00 cm. Pulmonic Valve: The pulmonic valve was normal in structure. Pulmonic valve regurgitation is mild. No evidence of pulmonic stenosis. Aorta: The aortic root is normal in size and structure. IAS/Shunts: The interatrial septum was not well visualized.  LEFT VENTRICLE PLAX 2D LVIDd:         4.30 cm   Diastology LVIDs:         3.00 cm   LV e' medial:    5.98 cm/s LV PW:         1.00 cm   LV E/e' medial:  25.4 LV IVS:        0.80 cm   LV e' lateral:   5.66 cm/s LVOT diam:     1.80 cm   LV E/e' lateral: 26.9 LV SV:         84 LV SV Index:   49 LVOT Area:     2.54 cm  RIGHT VENTRICLE RV S prime:     14.80 cm/s TAPSE (M-mode): 2.2 cm LEFT  ATRIUM             Index        RIGHT ATRIUM           Index LA Vol (A2C):   41.8 ml 24.62 ml/m  RA Area:     13.70 cm LA Vol (A4C):   35.7 ml 21.03 ml/m  RA Volume:   30.20 ml  17.79 ml/m LA Biplane Vol: 40.0 ml 23.56 ml/m  AORTIC VALVE AV Area (Vmax):    2.21 cm AV Area (Vmean):   1.96 cm AV Area (VTI):     2.00 cm AV Vmax:           161.00 cm/s AV Vmean:          122.000 cm/s AV VTI:            0.420 m AV Peak Grad:      10.4 mmHg AV Mean Grad:      6.0 mmHg LVOT Vmax:         140.00 cm/s LVOT Vmean:        94.100 cm/s LVOT VTI:          0.330 m LVOT/AV VTI ratio: 0.79 AI PHT:            300 msec  AORTA Ao Root diam: 1.90 cm Ao Asc diam:  2.60 cm MITRAL VALVE                TRICUSPID VALVE MV Area (PHT): 3.42 cm     TR Peak grad:   32.9 mmHg MV Area VTI:   2.14 cm     TR Vmax:        287.00 cm/s MV Peak grad:  10.2 mmHg MV Mean grad:  7.0 mmHg     SHUNTS MV Vmax:       1.60 m/s     Systemic VTI:  0.33 m MV Vmean:      125.3 cm/s   Systemic Diam: 1.80 cm MV Decel Time: 222 msec MV E velocity: 152.00 cm/s MV A velocity: 148.00 cm/s MV E/A ratio:  1.03 Riley Lam MD Electronically signed by Riley Lam MD Signature Date/Time: 06/20/2023/3:59:58 PM  Final    DG Foot Complete Right  Result Date: 06/19/2023 CLINICAL DATA:  Right foot pain EXAM: RIGHT FOOT COMPLETE - 3+ VIEW COMPARISON:  None Available. FINDINGS: Degenerative changes in the interphalangeal joints, first metatarsal-phalangeal joint, and intertarsal joints. No evidence of acute fracture or dislocation. No focal bone lesion or bone destruction. 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 Soft tissues are unremarkable. IMPRESSION: No acute bony abnormalities.  Mild degenerative changes. Electronically Signed   By: Burman Nieves M.D.   On: 06/19/2023 21:47   DG ERCP  Result Date: 06/19/2023 CLINICAL DATA:  ERCP EXAM: ERCP TECHNIQUE: Multiple spot images obtained with the fluoroscopic device and  submitted for interpretation post-procedure. FLUOROSCOPY: Refer to separate report COMPARISON:  None Available. FINDINGS: A total of 13 fluoroscopic spot images obtained during ERCP are submitted for review. Initial images demonstrate a scope overlying the upper abdomen. Surgical clips are present. Wire catheterization and contrast injection of the common bile duct is performed. There appear to be multiple small filling defects best identified on image 7. Balloon sphincterotomy of the ampulla followed by balloon sweeps of the common bile duct appear to be performed. IMPRESSION: ERCP images as described. Refer to dedicated procedure report for full details. These images were submitted for radiologic interpretation only. Please see the procedural report for the amount of contrast and the fluoroscopy time utilized. Electronically Signed   By: Olive Bass M.D.   On: 06/19/2023 15:55    Anti-infectives: Anti-infectives (From admission, onward)    Start     Dose/Rate Route Frequency Ordered Stop   06/21/23 0600  ceFAZolin (ANCEF) IVPB 2g/100 mL premix        2 g 200 mL/hr over 30 Minutes Intravenous On call to O.R. 06/20/23 1307 06/22/23 0559   06/19/23 1215  ampicillin-sulbactam (UNASYN) 1.5 g in sodium chloride 0.9 % 100 mL IVPB  Status:  Discontinued        1.5 g 200 mL/hr over 30 Minutes Intravenous  Once 06/19/23 1214 06/19/23 1547   06/17/23 1400  fosfomycin (MONUROL) packet 3 g        3 g Oral  Once 06/17/23 1300 06/17/23 1720   06/16/23 2315  cefTRIAXone (ROCEPHIN) 1 g in sodium chloride 0.9 % 100 mL IVPB        1 g 200 mL/hr over 30 Minutes Intravenous  Once 06/16/23 2313 06/17/23 0509        Assessment/Plan  Choledocholithiasis - s/p ERCP 8/12: Choledocholithiasis s/p biliary sphincterotomy, sphincteroplasty and stone extraction; nonfilling of the cystic duct/ gallbladder -cleared by cardiology, appreciate evaluation -Plan for laparoscopic cholecystectomy today.  I discussed the  procedure with the patient and her family as documented yesterday.  Questions welcomed and answered to their satisfaction.  ID - none currently FEN - CLD VTE - SCDs, per primary Foley - none   I reviewed hospitalist notes, last 24 h vitals and pain scores, last 48 h intake and output, and last 24 h labs and trends.    LOS: 4 days    Berna Bue, MD [facility] 06/21/2023, 8:31 AM Please see Amion for pager number during day hours 7:00am-4:30pm

## 2023-06-21 NOTE — Anesthesia Preprocedure Evaluation (Addendum)
Anesthesia Evaluation  Patient identified by MRN, date of birth, ID band Patient awake    Reviewed: Allergy & Precautions, NPO status , Patient's Chart, lab work & pertinent test results, reviewed documented beta blocker date and time   Airway Mallampati: II  TM Distance: >3 FB Neck ROM: Full    Dental  (+) Chipped, Missing, Dental Advisory Given, Poor Dentition   Pulmonary former smoker   Pulmonary exam normal breath sounds clear to auscultation       Cardiovascular hypertension, Pt. on medications and Pt. on home beta blockers + CAD  Normal cardiovascular exam+ Valvular Problems/Murmurs (mod/severe AI) AI  Rhythm:Regular Rate:Normal  TTE 2024 1. Left ventricular ejection fraction, by estimation, is 60 to 65%. The  left ventricle has normal function. The left ventricle has no regional  wall motion abnormalities. Left ventricular diastolic parameters are  indeterminate.   2. Right ventricular systolic function is normal. The right ventricular  size is normal.   3. The mitral valve is degenerative. No evidence of mitral valve  regurgitation. Mild mitral stenosis. The mean mitral valve gradient is 7.0  mmHg with average heart rate of 82 bpm. Severe mitral annular  calcification.   4. The aortic valve was not well visualized. Aortic valve regurgitation  is moderate to severe. Aortic valve sclerosis is present, with no evidence  of aortic valve stenosis. Aortic regurgitation PHT measures 300 msec.     Neuro/Psych negative neurological ROS  negative psych ROS   GI/Hepatic Neg liver ROS, PUD,,,  Endo/Other  negative endocrine ROS    Renal/GU Renal diseaseLab Results      Component                Value               Date                      NA                       136                 06/21/2023                CL                       103                 06/21/2023                K                        3.6                  06/21/2023                CO2                      24                  06/21/2023                BUN                      31 (H)              06/21/2023  CREATININE               2.74 (H)            06/21/2023                GFRNONAA                 16 (L)              06/21/2023                CALCIUM                  7.9 (L)             06/21/2023                PHOS                     4.1                 02/14/2017                ALBUMIN                  2.2 (L)             06/20/2023                GLUCOSE                  139 (H)             06/21/2023             negative genitourinary   Musculoskeletal  (+) Arthritis ,    Abdominal   Peds  Hematology  (+) Blood dyscrasia, anemia Lab Results      Component                Value               Date                      WBC                      13.4 (H)            06/21/2023                HGB                      8.9 (L)             06/21/2023                HCT                      27.2 (L)            06/21/2023                MCV                      92.5                06/21/2023                PLT  214                 06/21/2023              Anesthesia Other Findings   Reproductive/Obstetrics                             Anesthesia Physical Anesthesia Plan  ASA: 3  Anesthesia Plan: General   Post-op Pain Management: Tylenol PO (pre-op)*   Induction: Intravenous  PONV Risk Score and Plan: 3 and Dexamethasone, Ondansetron and Treatment may vary due to age or medical condition  Airway Management Planned: Oral ETT  Additional Equipment:   Intra-op Plan:   Post-operative Plan: Extubation in OR  Informed Consent: I have reviewed the patients History and Physical, chart, labs and discussed the procedure including the risks, benefits and alternatives for the proposed anesthesia with the patient or authorized representative who has indicated his/her  understanding and acceptance.   Patient has DNR.  Discussed DNR with patient and Suspend DNR.   Dental advisory given  Plan Discussed with: CRNA  Anesthesia Plan Comments:        Anesthesia Quick Evaluation

## 2023-06-21 NOTE — Discharge Instructions (Addendum)
[facility]  Please arrive at least 30 min before your appointment to complete your check in paperwork.  If you are unable to arrive 30 min prior to your appointment time we may have to cancel or reschedule you. LAPAROSCOPIC SURGERY: POST OP INSTRUCTIONS Always review your discharge instruction sheet given to you by the facility where your surgery was performed. IF YOU HAVE DISABILITY OR FAMILY LEAVE FORMS, YOU MUST BRING THEM TO THE OFFICE FOR PROCESSING.   DO NOT GIVE THEM TO YOUR DOCTOR.  PAIN CONTROL  First take acetaminophen (Tylenol) AND/or ibuprofen (Advil) to control your pain after surgery.  Follow directions on package.  Taking acetaminophen (Tylenol) and/or ibuprofen (Advil) regularly after surgery will help to control your pain and lower the amount of prescription pain medication you may need.  You should not take more than 4,000 mg (4 grams) of acetaminophen (Tylenol) in 24 hours.  You should not take ibuprofen (Advil), aleve, motrin, naprosyn or other NSAIDS if you have a history of stomach ulcers or chronic kidney disease.  A prescription for pain medication may be given to you upon discharge.  Take your pain medication as prescribed, if you still have uncontrolled pain after taking acetaminophen (Tylenol) or ibuprofen (Advil). Use ice packs to help control pain. If you need a refill on your pain medication, please contact your pharmacy.  They will contact our office to request authorization. Prescriptions will not be filled after 5pm or on week-ends.  HOME MEDICATIONS Take your usually prescribed medications unless otherwise directed.  DIET You should follow a light diet the first few days after arrival home.  Be sure to include lots of fluids daily. Avoid fatty, fried foods.   CONSTIPATION It is common to experience some constipation after surgery and if you are taking pain medication.  Increasing fluid intake and taking a stool softener (such as Colace)  will usually help or prevent this problem from occurring.  A mild laxative (Milk of Magnesia or Miralax) should be taken according to package instructions if there are no bowel movements after 48 hours.  WOUND/INCISION CARE Most patients will experience some swelling and bruising in the area of the incisions.  Ice packs will help.  Swelling and bruising can take several days to resolve.  Unless discharge instructions indicate otherwise, follow guidelines below  STERI-STRIPS - you may remove your outer bandages 48 hours after surgery, and you may shower at that time.  You have steri-strips (small skin tapes) in place directly over the incision.  These strips should be left on the skin for 7-10 days.   DERMABOND/SKIN GLUE - you may shower in 24 hours.  The glue will flake off over the next 2-3 weeks. Any sutures or staples will be removed at the office during your follow-up visit.  ACTIVITIES You may resume regular (light) daily activities beginning the next day--such as daily self-care, walking, climbing stairs--gradually increasing activities as tolerated.  You may have sexual intercourse when it is comfortable.  Refrain from any heavy lifting or straining until approved by your doctor. You may drive when you are no longer taking prescription pain medication, you can comfortably wear a seatbelt, and you can safely maneuver your car and apply brakes.  FOLLOW-UP You should see your doctor in the office for a follow-up appointment approximately 2-3 weeks after your surgery.  You should have been given your post-op/follow-up appointment when your surgery was scheduled.  If you did not receive a post-op/follow-up appointment, make sure  that you call for this appointment within a day or two after you arrive home to insure a convenient appointment time.  OTHER INSTRUCTIONS  WHEN TO CALL YOUR DOCTOR: Fever over 101.0 Inability to urinate Continued bleeding from incision. Increased pain, redness, or  drainage from the incision. Increasing abdominal pain  The clinic staff is available to answer your questions during regular business hours.  Please don't hesitate to call and ask to speak to one of the nurses for clinical concerns.  If you have a medical emergency, go to the nearest emergency room or call 911.  A surgeon from [facility] is always on call at the hospital. [address], [place], [place]  [address] ? [address] [address], [place], [place]   [address] [phone] ? [phone] ? FAX [phone]   Additional Discharge Instructions   Please get your medications reviewed and adjusted by your Primary MD.  Please request your Primary MD to go over all Hospital Tests and Procedure/Radiological results at the follow up, please get all Hospital records sent to your Prim MD by signing hospital release before you go home.  If you had Pneumonia of Lung problems at the Hospital: Please get a 2 view Chest X ray done in approximately 4 weeks after hospital discharge or sooner if instructed by your Primary MD.  If you have Congestive Heart Failure: Please call your Cardiologist or Primary MD anytime you have any of the following symptoms:  1) 3 pound weight gain in 24 hours or 5 pounds in 1 week  2) shortness of breath, with or without a dry hacking cough  3) swelling in the hands, feet or stomach  4) if you have to sleep on extra pillows at night in order to breathe  Follow cardiac low salt diet and 1.5 lit/day fluid restriction.  If you have diabetes Accuchecks 4 times/day, Once in AM empty stomach and then before each meal. Log in all results and show them to your primary doctor at your next visit. If any glucose reading is under 80 or above 300 call your primary MD immediately.  If you have Seizure/Convulsions/Epilepsy: Please do not drive, operate heavy machinery, participate in activities at heights or participate in high speed sports until you have  seen by Primary MD or a Neurologist and advised to do so again. Per [facility] statutes, patients with seizures are not allowed to drive until they have been seizure-free for six months.  Use caution when using heavy equipment or power tools. Avoid working on ladders or at heights. Take showers instead of baths. Ensure the water temperature is not too high on the home water heater. Do not go swimming alone. Do not lock yourself in a room alone (i.e. bathroom). When caring for infants or small children, sit down when holding, feeding, or changing them to minimize risk of injury to the child in the event you have a seizure. Maintain good sleep hygiene. Avoid alcohol.   If you had Gastrointestinal Bleeding: Please ask your Primary MD to check a complete blood count within one week of discharge or at your next visit. Your endoscopic/colonoscopic biopsies that are pending at the time of discharge, will also need to followed by your Primary MD.  Get Medicines reviewed and adjusted. Please take all your medications with you for your next visit with your Primary MD  Please request your Primary MD to go over all hospital tests and procedure/radiological results at the follow up, please  ask your Primary MD to get all Hospital records sent to his/her office.  If you experience worsening of your admission symptoms, develop shortness of breath, life threatening emergency, suicidal or homicidal thoughts you must seek medical attention immediately by calling 911 or calling your MD immediately  if symptoms less severe.  You must read complete instructions/literature along with all the possible adverse reactions/side effects for all the Medicines you take and that have been prescribed to you. Take any new Medicines after you have completely understood and accpet all the possible adverse reactions/side effects.   Do not drive or operate heavy machinery when taking Pain medications.   Do not take more than  prescribed Pain, Sleep and Anxiety Medications  Special Instructions: If you have smoked or chewed Tobacco  in the last 2 yrs please stop smoking, stop any regular Alcohol  and or any Recreational drug use.  Wear Seat belts while driving.  Please note You were cared for by a hospitalist during your hospital stay. If you have any questions about your discharge medications or the care you received while you were in the hospital after you are discharged, you can call the unit and asked to speak with the hospitalist on call if the hospitalist that took care of you is not available. Once you are discharged, your primary care physician will handle any further medical issues. Please note that NO REFILLS for any discharge medications will be authorized once you are discharged, as it is imperative that you return to your primary care physician (or establish a relationship with a primary care physician if you do not have one) for your aftercare needs so that they can reassess your need for medications and monitor your lab values.  You can reach the hospitalist office at phone [phone] or fax [phone]   If you do not have a primary care physician, you can call [phone] for a physician referral.

## 2023-06-21 NOTE — Anesthesia Postprocedure Evaluation (Signed)
Anesthesia Post Note  Patient: Amy [name]  Procedure(s) Performed: LAPAROSCOPIC CHOLECYSTECTOMY (Abdomen)     Patient location during evaluation: PACU Anesthesia Type: General Level of consciousness: awake and alert Pain management: pain level controlled Vital Signs Assessment: post-procedure vital signs reviewed and stable Respiratory status: spontaneous breathing, nonlabored ventilation, respiratory function stable and patient connected to nasal cannula oxygen Cardiovascular status: blood pressure returned to baseline and stable Postop Assessment: no apparent nausea or vomiting Anesthetic complications: no  No notable events documented.  Last Vitals:  Vitals:   06/21/23 1415 06/21/23 1430  BP: (!) 182/51 (!) 173/54  Pulse: 71 72  Resp: 16 15  Temp:  36.4 C  SpO2: 100% 100%    Last Pain:  Vitals:   06/21/23 1425  TempSrc:   PainSc: 4                   L 

## 2023-06-21 NOTE — Transfer of Care (Signed)
Immediate Anesthesia Transfer of Care Note  Patient: Amy [name]  Procedure(s) Performed: LAPAROSCOPIC CHOLECYSTECTOMY (Abdomen)  Patient Location: PACU  Anesthesia Type:General  Level of Consciousness: awake, alert , and oriented  Airway & Oxygen Therapy: Patient Spontanous Breathing and Patient connected to nasal cannula oxygen  Post-op Assessment: Report given to RN and Post -op Vital signs reviewed and stable  Post vital signs: Reviewed and stable  Last Vitals:  Vitals Value Taken Time  BP 162/50 06/21/23 1348  Temp    Pulse 68 06/21/23 1351  Resp 21 06/21/23 1351  SpO2 100 % 06/21/23 1351  Vitals shown include unfiled device data.  Last Pain:  Vitals:   06/21/23 1115  TempSrc:   PainSc: 0-No pain      Patients Stated Pain Goal: 0 (06/17/23 1050)  Complications: No notable events documented.

## 2023-06-21 NOTE — Progress Notes (Signed)
PROGRESS NOTE    ANNALINA MONTEZUMA  IRJ:[id number] DOB: November 19, 1935 DOA: 06/16/2023 PCP: Geoffry Paradise, MD    Brief Narrative:   Amy [name] is a 87 y.o. female with past medical history significant for HTN, GERD, iron deficiency anemia, CKD stage IIIb/IV, PUD, who presented to [facility] ED on 8/9 by direction of her outpatient PCP for concerns of worsening kidney function.  Patient with elevated creatinine of 3.5; in which she had been on HCTZ which has been held.  Patient endorses nausea/vomiting, abdominal pain localized in the epigastric area.  Also associated with progressive weakness.  In the ED, temperature 97.8 F, HR 65, RR 18, BP 143/46, SpO2 100% on room air.  WBC 13.7, hemoglobin 10.9, platelets 222.  Sodium 124, potassium 5.5, chloride 97, CO2 16, glucose 123, BUN 59, creatinine 3.45.  AST 15, ALT 14, total bilirubin 0.6.  Urinalysis with large leukocytes, negative nitrite, few bacteria, greater than 50 WBCs.  FOBT positive.  CT abdomen/pelvis without contrast with persistent cholelithiasis and persistent choledocholithiasis with slightly increased size, bile duct dilation, new CT finding of acute cholecystitis, multilevel severe degenerative changes spine with impacted/fused L4-5 levels and associated grade 2 anterolisthesis L4 on L5, trace hiatal hernia.  Patient received 1 L NS bolus.  GI was consulted.  TRH consulted for admission for further evaluation and management.  Patient was transferred to [facility].  Assessment & Plan:   Acute renal failure on CKD stage IIIb/IV Nonanion gap metabolic acidosis: Resolved Patient staying with a creatinine of 3.45.  Review of EMR notable for baseline creatinine 1.9 2018. -- Cr 3.45>>2.86>2.74 -- serum CO2 16>>24 -- Sodium bicarb drip at 100 mL/h -- Continue to hold home olmesartan -- BMP daily -- Would benefit from outpatient nephrology follow-up  Upper GI bleed secondary to peptic ulcer Patient presenting with a  hemoglobin of 10.9, baseline 13.  FOBT positive.  History of peptic ulcer disease.  Patient underwent ERCP on 06/19/2023 with findings of 8 mm ulcer prepyloric area without active bleeding.  Anemia panel with iron 62, TIBC 220, ferritin 28, folate 4.8, vitamin B12 459. -- Hgb 10.9>>8.6>9.3>8.9 --Folic acid 1 mg p.o. daily -- CBC daily  Choledocholithiasis Chronic cholelithiasis CT abdomen/pelvis with persistent cholelithiasis/choledocholithiasis with slightly increased size of bile duct.  GI was consulted and patient underwent ERCP 8/12 with findings significant for CBD dilated to 15 mm with multiple filling defects s/p sphincterotomy and sweeping of the biliary tree with extraction of stones, nonfilling of the cystic duct/gallbladder.  General surgery was consulted and patient underwent laparoscopic cholecystectomy by Dr. Fredricka Bonine on 06/21/2023.   Folic acid deficiency Folic acid low at 4.8. -- Folic acid 1 mg p.o. daily  Essential hypertension -- Amlodipine 5 mg p.o. daily -- Atenolol 25 mg p.o. daily  Hyperkalemia: Resolved Potassium 5.5 on admission, likely secondary to acute renal failure.  Improved with IV fluid hydration, now 3.6.    Hyponatremia: Resolved Etiology likely secondary to poor oral intake versus outpatient hydrochlorothiazide use. -- Na 124>>134>136 --HCTZ discontinued -- Continue sodium bicarb drip as above -- BMP daily  Right foot pain Patient course intermittent pain to her right foot.  X-ray with no acute findings. Resting right and left ankle-brachial index indicates noncompressible right lower extremity arteries with abnormal toe brachial index. -- Recommend outpatient follow-up with vascular surgery.  Weakness/debility/deconditioning: Patient ambulates with the use of a walker/cane at baseline. -- PT evaluation: Pending -- OT: Recommending SNF   DVT prophylaxis: SCDs Start: 06/17/23  1308    Code Status: DNR Family Communication: No family present at  bedside this morning  Disposition Plan:  Level of care: Telemetry Medical Status is: Inpatient Remains inpatient appropriate because: Pending laparoscopic cholecystectomy today, possible need of SNF placement    Consultants:  [facility]  Procedures:  ERCP 8/12 Laparoscopic cholecystectomy 8/14  Antimicrobials:  Ceftriaxone 8/9 - 8/9  fosfomycin 8/10 - 8/10  ciprofloxacin 8/12 - 8/12   Subjective: Patient seen examined bedside, resting comfortably.  Lying in bed.  Awaiting for her laparoscopic cholecystectomy later this afternoon.  No other questions or concerns at this time.  Denies headache, no dizziness, no chest pain, no palpitations, no shortness of breath, no abdominal pain, no focal weakness, no fatigue, no paresthesias.  No acute events overnight per nursing staff.  Objective: Vitals:   06/21/23 0025 06/21/23 0449 06/21/23 0742 06/21/23 1101  BP: (!) 157/68 (!) 168/73 (!) 139/52 (!) 182/62  Pulse: 81 86 78 81  Resp: 16 18 19 20   Temp: 98.2 F (36.8 C) 98.5 F (36.9 C) 97.8 F (36.6 C) 98.1 F (36.7 C)  TempSrc:    Oral  SpO2: 98% 98% 98% 96%  Weight:    64 kg  Height:    5\' 5"  (1.651 m)    Intake/Output Summary (Last 24 hours) at 06/21/2023 1346 Last data filed at 06/21/2023 1328 Gross per 24 hour  Intake 2133.13 ml  Output 1200 ml  Net 933.13 ml   Filed Weights   06/18/23 0607 06/19/23 1210 06/21/23 1101  Weight: 63.3 kg 63.3 kg 64 kg    Examination:  Physical Exam: GEN: NAD, alert and oriented x 3, elderly in appearance HEENT: NCAT, PERRL, EOMI, sclera clear, MMM PULM: CTAB w/o wheezes/crackles, normal respiratory effort, on room air CV: RRR w/o M/G/R GI: abd soft, NTND, NABS, no R/G/M MSK: no peripheral edema, moves all extremities dependently NEURO: No focal neurological deficit PSYCH: normal mood/affect Integumentary: No concerning rashes/lesions/wounds noted on exposed skin surfaces.    Data Reviewed: I have personally reviewed following  labs and imaging studies  CBC: Recent Labs  Lab 06/16/23 1905 06/17/23 0702 06/18/23 0350 06/19/23 0149 06/21/23 0641  WBC 13.7*  --  9.5 9.6 13.4*  NEUTROABS 10.6*  --   --   --   --   HGB 10.9* 9.6* 8.6* 9.3* 8.9*  HCT 34.1* 29.4* 26.3* 28.8* 27.2*  MCV 96.1  --  93.6 97.0 92.5  PLT 222  --  170 181 214   Basic Metabolic Panel: Recent Labs  Lab 06/17/23 0702 06/18/23 0350 06/19/23 0149 06/20/23 0307 06/21/23 0641  NA 127* 130* 131* 134* 136  K 4.8 4.3 4.2 5.1 3.6  CL 97* 106 108 109 103  CO2 14* 15* 16* 15* 24  GLUCOSE 108* 88 89 132* 139*  BUN 49* 43* 37* 37* 31*  CREATININE 3.27* 3.05* 2.80* 2.86* 2.74*  CALCIUM 8.3* 7.7* 7.8* 8.0* 7.9*  MG  --  1.4* 2.1  --   --    GFR: Estimated Creatinine Clearance: 13.5 mL/min (A) (by C-G formula based on SCr of 2.74 mg/dL (H)). Liver Function Tests: Recent Labs  Lab 06/16/23 1905 06/20/23 0307  AST 15 24  ALT 14 19  ALKPHOS 65 54  BILITOT 0.6 0.4  PROT 7.4 5.2*  ALBUMIN 3.5 2.2*   Recent Labs  Lab 06/20/23 0307  LIPASE 39   No results for input(s): "AMMONIA" in the last 168 hours. Coagulation Profile: No results for input(s): "INR", "  PROTIME" in the last 168 hours. Cardiac Enzymes: No results for input(s): "CKTOTAL", "CKMB", "CKMBINDEX", "TROPONINI" in the last 168 hours. BNP (last 3 results) No results for input(s): "PROBNP" in the last 8760 hours. HbA1C: No results for input(s): "HGBA1C" in the last 72 hours. CBG: Recent Labs  Lab 06/18/23 0852  GLUCAP 96   Lipid Profile: No results for input(s): "CHOL", "HDL", "LDLCALC", "TRIG", "CHOLHDL", "LDLDIRECT" in the last 72 hours. Thyroid Function Tests: No results for input(s): "TSH", "T4TOTAL", "FREET4", "T3FREE", "THYROIDAB" in the last 72 hours. Anemia Panel: No results for input(s): "VITAMINB12", "FOLATE", "FERRITIN", "TIBC", "IRON", "RETICCTPCT" in the last 72 hours. Sepsis Labs: No results for input(s): "PROCALCITON", "LATICACIDVEN" in the last  168 hours.  Recent Results (from the past 240 hour(s))  Surgical pcr screen     Status: None   Collection Time: 06/20/23 11:05 PM   Specimen: Nasal Mucosa; Nasal Swab  Result Value Ref Range Status   MRSA, PCR NEGATIVE NEGATIVE Final   Staphylococcus aureus NEGATIVE NEGATIVE Final    Comment: (NOTE) The Xpert SA Assay (FDA approved for NASAL specimens in patients 53 years of age and older), is one component of a comprehensive surveillance program. It is not intended to diagnose infection nor to guide or monitor treatment. Performed at [facility], [address] [place], [place] [address]          Radiology Studies: VAS [place] ABI WITH/WO TBI  Result Date: 06/20/2023  LOWER EXTREMITY DOPPLER STUDY Patient Name:  BARBEE CAVINESS  Date of Exam:   06/20/2023 Medical Rec #: [id number]       Accession #:    [id number] Date of Birth: Jul 16, 1936       Patient Gender: F Patient Age:   49 years Exam Location:  [facility] Procedure:      VAS [place] ABI WITH/WO TBI Referring Phys: Calvert Cantor --------------------------------------------------------------------------------  Indications: Rest pain. High Risk Factors: Hypertension, past history of smoking.  Comparison Study: No previous study. Performing Technologist: McKayla Maag RVT, [place]  Examination Guidelines: A complete evaluation includes at minimum, Doppler waveform signals and systolic blood pressure reading at the level of bilateral brachial, anterior tibial, and posterior tibial arteries, when vessel segments are accessible. Bilateral testing is considered an integral part of a complete examination. Photoelectric Plethysmograph (PPG) waveforms and toe systolic pressure readings are included as required and additional duplex testing as needed. Limited examinations for reoccurring indications may be performed as noted.  ABI Findings: +---------+--------------+-----+-------------------+---------------------------+ Right    Rt Pressure    IndexWaveform           Comment                              (mmHg)                                                            +---------+--------------+-----+-------------------+---------------------------+ Brachial 154                biphasic                                       +---------+--------------+-----+-------------------+---------------------------+ PTA  255           1.66 monophasic         Non-compressible            +---------+--------------+-----+-------------------+---------------------------+ DP                          dampened monophasicWaveform obtained with 2D                                                  imaging. No pressure                                                       obtained.                   +---------+--------------+-----+-------------------+---------------------------+ Great Toe39            0.25 Abnormal                                       +---------+--------------+-----+-------------------+---------------------------+ +---------+------------------+-----+----------+----------------+ Left     Lt Pressure (mmHg)IndexWaveform  Comment          +---------+------------------+-----+----------+----------------+ PTA      255               1.66 monophasicNon-compressible +---------+------------------+-----+----------+----------------+ DP       96                0.62 monophasic                 +---------+------------------+-----+----------+----------------+ Great Toe57                0.37 Abnormal                   +---------+------------------+-----+----------+----------------+ +-------+----------------+-----------+------------+------------+ ABI/TBIToday's ABI     Today's TBIPrevious ABIPrevious TBI +-------+----------------+-----------+------------+------------+ Right  Non-compressible0.25                                +-------+----------------+-----------+------------+------------+ Left    Non-compressible0.37                                +-------+----------------+-----------+------------+------------+  Summary: Right: Resting right ankle-brachial index indicates noncompressible right lower extremity arteries. The right toe-brachial index is abnormal. Left: Resting left ankle-brachial index indicates noncompressible left lower extremity arteries. The left toe-brachial index is abnormal. *See table(s) above for measurements and observations.  Electronically signed by Coral Else MD on 06/20/2023 at 9:06:27 PM.    Final    ECHOCARDIOGRAM COMPLETE  Result Date: 06/20/2023    ECHOCARDIOGRAM REPORT   Patient Name:   EVVY PHARO Date of Exam: 06/20/2023 Medical Rec #:  [id number]      Height:       65.0 in Accession #:    [id number]     Weight:       139.6 lb Date of Birth:  [birth year]      BSA:          1.698 m  Patient Age:    87 years       BP:           149/58 mmHg Patient Gender: F              HR:           82 bpm. Exam Location:  Inpatient Procedure: 2D Echo, Cardiac Doppler and Color Doppler Indications:    Pre-Op  History:        Patient has no prior history of Echocardiogram examinations.                 CAD; Risk Factors:Hypertension.  Sonographer:    Darlys Gales Referring Phys: Leone Brand IMPRESSIONS  1. Left ventricular ejection fraction, by estimation, is 60 to 65%. The left ventricle has normal function. The left ventricle has no regional wall motion abnormalities. Left ventricular diastolic parameters are indeterminate.  2. Right ventricular systolic function is normal. The right ventricular size is normal.  3. The mitral valve is degenerative. No evidence of mitral valve regurgitation. Mild mitral stenosis. The mean mitral valve gradient is 7.0 mmHg with average heart rate of 82 bpm. Severe mitral annular calcification.  4. The aortic valve was not well visualized. Aortic valve regurgitation is moderate to severe. Aortic valve sclerosis is present, with no evidence of  aortic valve stenosis. Aortic regurgitation PHT measures 300 msec. Comparison(s): No prior Echocardiogram. FINDINGS  Left Ventricle: Left ventricular ejection fraction, by estimation, is 60 to 65%. The left ventricle has normal function. The left ventricle has no regional wall motion abnormalities. The left ventricular internal cavity size was normal in size. There is  no left ventricular hypertrophy. Left ventricular diastolic parameters are indeterminate. Right Ventricle: The right ventricular size is normal. No increase in right ventricular wall thickness. Right ventricular systolic function is normal. Left Atrium: Left atrial size was normal in size. Right Atrium: Right atrial size was normal in size. Pericardium: Trivial pericardial effusion is present. Mitral Valve: 2D Mitral valve area 3.48 cm2. The mitral valve is degenerative in appearance. Severe mitral annular calcification. No evidence of mitral valve regurgitation. Mild mitral valve stenosis. MV peak gradient, 10.2 mmHg. The mean mitral valve gradient is 7.0 mmHg with average heart rate of 82 bpm. Tricuspid Valve: The tricuspid valve is normal in structure. Tricuspid valve regurgitation is not demonstrated. No evidence of tricuspid stenosis. Aortic Valve: The aortic valve was not well visualized. Aortic valve regurgitation is moderate to severe. Aortic regurgitation PHT measures 300 msec. Aortic valve sclerosis is present, with no evidence of aortic valve stenosis. Aortic valve mean gradient  measures 6.0 mmHg. Aortic valve peak gradient measures 10.4 mmHg. Aortic valve area, by VTI measures 2.00 cm. Pulmonic Valve: The pulmonic valve was normal in structure. Pulmonic valve regurgitation is mild. No evidence of pulmonic stenosis. Aorta: The aortic root is normal in size and structure. IAS/Shunts: The interatrial septum was not well visualized.  LEFT VENTRICLE PLAX 2D LVIDd:         4.30 cm   Diastology LVIDs:         3.00 cm   LV e' medial:    5.98  cm/s LV PW:         1.00 cm   LV E/e' medial:  25.4 LV IVS:        0.80 cm   LV e' lateral:   5.66 cm/s LVOT diam:     1.80 cm   LV E/e' lateral: 26.9 LV SV:  84 LV SV Index:   49 LVOT Area:     2.54 cm  RIGHT VENTRICLE RV S prime:     14.80 cm/s TAPSE (M-mode): 2.2 cm LEFT ATRIUM             Index        RIGHT ATRIUM           Index LA Vol (A2C):   41.8 ml 24.62 ml/m  RA Area:     13.70 cm LA Vol (A4C):   35.7 ml 21.03 ml/m  RA Volume:   30.20 ml  17.79 ml/m LA Biplane Vol: 40.0 ml 23.56 ml/m  AORTIC VALVE AV Area (Vmax):    2.21 cm AV Area (Vmean):   1.96 cm AV Area (VTI):     2.00 cm AV Vmax:           161.00 cm/s AV Vmean:          122.000 cm/s AV VTI:            0.420 m AV Peak Grad:      10.4 mmHg AV Mean Grad:      6.0 mmHg LVOT Vmax:         140.00 cm/s LVOT Vmean:        94.100 cm/s LVOT VTI:          0.330 m LVOT/AV VTI ratio: 0.79 AI PHT:            300 msec  AORTA Ao Root diam: 1.90 cm Ao Asc diam:  2.60 cm MITRAL VALVE                TRICUSPID VALVE MV Area (PHT): 3.42 cm     TR Peak grad:   32.9 mmHg MV Area VTI:   2.14 cm     TR Vmax:        287.00 cm/s MV Peak grad:  10.2 mmHg MV Mean grad:  7.0 mmHg     SHUNTS MV Vmax:       1.60 m/s     Systemic VTI:  0.33 m MV Vmean:      125.3 cm/s   Systemic Diam: 1.80 cm MV Decel Time: 222 msec MV E velocity: 152.00 cm/s MV A velocity: 148.00 cm/s MV E/A ratio:  1.03 Riley Lam MD Electronically signed by Riley Lam MD Signature Date/Time: 06/20/2023/3:59:58 PM    Final    DG Foot Complete Right  Result Date: 06/19/2023 CLINICAL DATA:  Right foot pain EXAM: RIGHT FOOT COMPLETE - 3+ VIEW COMPARISON:  None Available. FINDINGS: Degenerative changes in the interphalangeal joints, first metatarsal-phalangeal joint, and intertarsal joints. No evidence of acute fracture or dislocation. No focal bone lesion or bone destruction. 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 Soft tissues are unremarkable.  IMPRESSION: No acute bony abnormalities.  Mild degenerative changes. Electronically Signed   By: Burman Nieves M.D.   On: 06/19/2023 21:47   DG ERCP  Result Date: 06/19/2023 CLINICAL DATA:  ERCP EXAM: ERCP TECHNIQUE: Multiple spot images obtained with the fluoroscopic device and submitted for interpretation post-procedure. FLUOROSCOPY: Refer to separate report COMPARISON:  None Available. FINDINGS: A total of 13 fluoroscopic spot images obtained during ERCP are submitted for review. Initial images demonstrate a scope overlying the upper abdomen. Surgical clips are present. Wire catheterization and contrast injection of the common bile duct is performed. There appear to be multiple small filling defects best identified on image 7. Balloon sphincterotomy of the ampulla followed  by balloon sweeps of the common bile duct appear to be performed. IMPRESSION: ERCP images as described. Refer to dedicated procedure report for full details. These images were submitted for radiologic interpretation only. Please see the procedural report for the amount of contrast and the fluoroscopy time utilized. Electronically Signed   By: Olive Bass M.D.   On: 06/19/2023 15:55        Scheduled Meds:  [MAR Hold] amLODipine  5 mg Oral Daily   [MAR Hold] atenolol  25 mg Oral Daily   [MAR Hold] diclofenac  100 mg Rectal Once   [MAR Hold] folic acid  1 mg Oral Daily   [MAR Hold] pantoprazole  40 mg Oral BID   Continuous Infusions:  sodium chloride 10 mL/hr at 06/21/23 1128   ceFAZolin     lactated ringers     sodium bicarbonate 150 mEq in dextrose 5 % 1,150 mL infusion 100 mL/hr at 06/21/23 0916     LOS: 4 days    Time spent: 52 minutes spent on chart review, discussion with nursing staff, consultants, updating family and interview/physical exam; more than 50% of that time was spent in counseling and/or coordination of care.    Alvira Philips [place], DO [facility] Available via Epic secure chat  7am-7pm After these hours, please refer to coverage provider listed on [email or web address] 06/21/2023, 1:46 PM

## 2023-06-21 NOTE — Plan of Care (Signed)

## 2023-06-21 NOTE — Anesthesia Procedure Notes (Addendum)
Procedure Name: Intubation Date/Time: 06/21/2023 11:49 AM  Performed by: Rachel Moulds, CRNAPre-anesthesia Checklist: Emergency Drugs available, Suction available, Patient identified, Patient being monitored and Timeout performed Patient Re-evaluated:Patient Re-evaluated prior to induction Oxygen Delivery Method: Circle system utilized Preoxygenation: Pre-oxygenation with 100% oxygen Induction Type: IV induction Ventilation: Mask ventilation without difficulty Laryngoscope Size: Mac and 3 Grade View: Grade I Tube type: Oral Tube size: 7.0 mm Number of attempts: 1 Airway Equipment and Method: Stylet Placement Confirmation: breath sounds checked- equal and bilateral, CO2 detector, positive ETCO2 and ETT inserted through vocal cords under direct vision Secured at: 21 cm Tube secured with: Tape Dental Injury: Teeth and Oropharynx as per pre-operative assessment

## 2023-06-21 NOTE — Op Note (Addendum)
Operative Note  Amy [name] 87 y.o. female [id number]  06/21/2023  Surgeon: Berna Bue MD FACS  Assistant: Carlena Bjornstad PA-C  Procedure performed: Laparoscopic Cholecystectomy, laparoscopic lysis of adhesions x 45 minutes  Procedure classification: Urgent  Preop diagnosis: History of choledocholithiasis status post successful ERCP Post-op diagnosis/intraop findings: same, chronic cholecystitis  Specimens: gallbladder  Retained items: 19 French round Blake drain  EBL: minimal  Complications: none  Description of procedure: After obtaining informed consent the patient was brought to the operating room. Antibiotics were administered. SCD's were applied. General endotracheal anesthesia was initiated and a formal time-out was performed. The abdomen was prepped and draped in the usual sterile fashion and the abdomen was entered using an infraumbilical veress needle after instilling the site with local. Insufflation to was obtained, 5mm trocar and camera inserted, and gross inspection revealed no evidence of injury from our entry.   There were extensive adhesions of colon and omentum in the right upper quadrant.  I was able to place a right lateral 5 mm trocar and takedown some of these sharply to create space for an additional midclavicular line 5 mm trocar which was then inserted along with an epigastric 11 mm trocar.  A combination of sharp dissection, cautery, and blunt dissection ensued to lyse extensive dense adhesions to the undersurface of the liver and gallbladder.  The duodenum was tethered up to the liver and this was taken down sharply; there was no injury to the duodenum in doing so.  Extensive omental adhesions to the undersurface of the liver and to the gallbladder were taken down bluntly, sharply, and with judicious use of cautery until the gallbladder fundus could be visualized. This took about 45 minutes.  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  With a combination of blunt dissection and cautery we were ultimately able to sweep further adhesions down until the infundibulum was visualized.  This was somewhat tethered posteriorly and medially, again due to cicatrix from her prior surgery and metallic clips.  In addition to this the left lobe of the liver was somewhat redundant and floppy and had to be retracted anteriorly with some difficulty to afford adequate visualization.  Ultimately we were able to follow the infundibulum down to the cystic duct which was circumferentially isolated.  3 clips were placed across the proximal cystic duct and 1 distally and then this was divided sharply.  This was inspected and confirmed to be the lumen of the cystic duct.  The posterior and medial aspect of the infundibulum was extremely tethered to cicatrix and we proceeded with careful dissection with the hook to divide thin transparent strands of tissue to free this area.  The cystic artery was seen and this was actually able to be dissected away from the gallbladder and left within the peritoneum without being divided.  We did a combination of dome down as well as proximal to distal dissection and ultimately we were able to free the gallbladder from the liver bed.  A small island of gallbladder mucosa was left on the liver bed and this was ablated with cautery.  The gallbladder was placed in an Endo Catch bag and removed through the epigastric trocar site. Some bile and small black stones had been spilled from the gallbladder during its dissection from the liver bed. This was aspirated and the right upper  quadrant was irrigated copiously until the effluent was clear. Hemostasis was once again confirmed, and reinspection of the abdomen revealed no injuries. The clips were well opposed without any bile  leak from the duct or the liver bed.  A 19 French round Blake drain was inserted through the right lateral trocar site and directed up into the liver bed.  This was secured to the skin with a 2-0 nylon.  The 11mm trocar site in the epigastrium was closed with a 0 vicryl in the fascia under direct visualization using a PMI device. The abdomen was desufflated and all trocars removed. The skin incisions were closed with subcuticular 4-0 monocryl and Dermabond. The patient was awakened, extubated and transported to the recovery room in stable condition.    All counts were correct at the completion of the case.

## 2023-06-21 NOTE — Progress Notes (Signed)
PT Cancellation Note  Patient Details Name: Amy [name] MRN: [id number] DOB: 1936/06/03   Cancelled Treatment:    Reason Eval/Treat Not Completed: Patient at procedure or test/unavailable (Pt off unit to OR for laparoscopic cholecystectomy today. Will follow up at later date/time as schedule allows and pt able.)    Renaldo Fiddler PT, DPT Acute Rehabilitation Services Office [phone]  06/21/23 12:16 PM

## 2023-06-22 ENCOUNTER — Encounter (HOSPITAL_COMMUNITY): Payer: Self-pay | Admitting: Surgery

## 2023-06-22 DIAGNOSIS — N179 Acute kidney failure, unspecified: Secondary | ICD-10-CM | POA: Diagnosis not present

## 2023-06-22 LAB — LIPASE, BLOOD: Lipase: 70 U/L — ABNORMAL HIGH (ref 11–51)

## 2023-06-22 LAB — CBC
HCT: 26.1 % — ABNORMAL LOW (ref 36.0–46.0)
Hemoglobin: 8.7 g/dL — ABNORMAL LOW (ref 12.0–15.0)
MCH: 31.4 pg (ref 26.0–34.0)
MCHC: 33.3 g/dL (ref 30.0–36.0)
MCV: 94.2 fL (ref 80.0–100.0)
Platelets: 179 10*3/uL (ref 150–400)
RBC: 2.77 MIL/uL — ABNORMAL LOW (ref 3.87–5.11)
RDW: 15.7 % — ABNORMAL HIGH (ref 11.5–15.5)
WBC: 10 10*3/uL (ref 4.0–10.5)
nRBC: 0 % (ref 0.0–0.2)

## 2023-06-22 LAB — COMPREHENSIVE METABOLIC PANEL
ALT: 38 U/L (ref 0–44)
AST: 59 U/L — ABNORMAL HIGH (ref 15–41)
Albumin: 2 g/dL — ABNORMAL LOW (ref 3.5–5.0)
Alkaline Phosphatase: 39 U/L (ref 38–126)
Anion gap: 9 (ref 5–15)
BUN: 30 mg/dL — ABNORMAL HIGH (ref 8–23)
CO2: 27 mmol/L (ref 22–32)
Calcium: 7.3 mg/dL — ABNORMAL LOW (ref 8.9–10.3)
Chloride: 99 mmol/L (ref 98–111)
Creatinine, Ser: 2.36 mg/dL — ABNORMAL HIGH (ref 0.44–1.00)
GFR, Estimated: 20 mL/min — ABNORMAL LOW (ref >60)
Glucose, Bld: 120 mg/dL — ABNORMAL HIGH (ref 70–99)
Potassium: 3.2 mmol/L — ABNORMAL LOW (ref 3.5–5.1)
Sodium: 135 mmol/L (ref 135–145)
Total Bilirubin: 0.4 mg/dL (ref 0.3–1.2)
Total Protein: 4.8 g/dL — ABNORMAL LOW (ref 6.5–8.1)

## 2023-06-22 LAB — SURGICAL PATHOLOGY

## 2023-06-22 LAB — MAGNESIUM: Magnesium: 1.4 mg/dL — ABNORMAL LOW (ref 1.7–2.4)

## 2023-06-22 MED ORDER — AMLODIPINE BESYLATE 10 MG PO TABS
10.0000 mg | ORAL_TABLET | Freq: Every day | ORAL | Status: DC
  Administered 2023-06-23 – 2023-06-28 (×6): 10 mg via ORAL
  Filled 2023-06-22 (×6): qty 1

## 2023-06-22 MED ORDER — AMLODIPINE BESYLATE 5 MG PO TABS
5.0000 mg | ORAL_TABLET | Freq: Every day | ORAL | Status: DC
  Administered 2023-06-22: 5 mg via ORAL
  Filled 2023-06-22: qty 1

## 2023-06-22 MED ORDER — MAGNESIUM SULFATE 2 GM/50ML IV SOLN
INTRAVENOUS | Status: AC
  Administered 2023-06-22: 4 g via INTRAVENOUS
  Filled 2023-06-22: qty 100

## 2023-06-22 MED ORDER — POTASSIUM CHLORIDE CRYS ER 20 MEQ PO TBCR
40.0000 meq | EXTENDED_RELEASE_TABLET | ORAL | Status: AC
  Administered 2023-06-22 (×2): 40 meq via ORAL
  Filled 2023-06-22 (×2): qty 2

## 2023-06-22 MED ORDER — MAGNESIUM SULFATE 4 GM/100ML IV SOLN
4.0000 g | Freq: Once | INTRAVENOUS | Status: AC
  Filled 2023-06-22: qty 100

## 2023-06-22 MED ORDER — HYDRALAZINE HCL 20 MG/ML IJ SOLN
10.0000 mg | Freq: Three times a day (TID) | INTRAMUSCULAR | Status: DC | PRN
  Administered 2023-06-22 – 2023-06-26 (×2): 10 mg via INTRAVENOUS
  Filled 2023-06-22 (×2): qty 1

## 2023-06-22 NOTE — Plan of Care (Signed)

## 2023-06-22 NOTE — Progress Notes (Signed)
I sent a secure message to Dr. Tereasa Coop concerning the patient's blood pressure. It was 184/57. She ordered 5 mg of amlodipine and 10 mg of hydralazine IV.

## 2023-06-22 NOTE — Progress Notes (Signed)
PROGRESS NOTE    Amy [name]  GNF:[id number] DOB: November 22, 1935 DOA: 06/16/2023 PCP: Geoffry Paradise, MD    Brief Narrative:   Amy [name] is a 87 y.o. female with past medical history significant for HTN, GERD, iron deficiency anemia, CKD stage IIIb/IV, PUD, who presented to [facility] ED on 8/9 by direction of her outpatient PCP for concerns of worsening kidney function.  Patient with elevated creatinine of 3.5; in which she had been on HCTZ which has been held.  Patient endorses nausea/vomiting, abdominal pain localized in the epigastric area.  Also associated with progressive weakness.  In the ED, temperature 97.8 F, HR 65, RR 18, BP 143/46, SpO2 100% on room air.  WBC 13.7, hemoglobin 10.9, platelets 222.  Sodium 124, potassium 5.5, chloride 97, CO2 16, glucose 123, BUN 59, creatinine 3.45.  AST 15, ALT 14, total bilirubin 0.6.  Urinalysis with large leukocytes, negative nitrite, few bacteria, greater than 50 WBCs.  FOBT positive.  CT abdomen/pelvis without contrast with persistent cholelithiasis and persistent choledocholithiasis with slightly increased size, bile duct dilation, new CT finding of acute cholecystitis, multilevel severe degenerative changes spine with impacted/fused L4-5 levels and associated grade 2 anterolisthesis L4 on L5, trace hiatal hernia.  Patient received 1 L NS bolus.  GI was consulted.  TRH consulted for admission for further evaluation and management.  Patient was transferred to [facility].  Assessment & Plan:   Acute renal failure on CKD stage IIIb/IV Nonanion gap metabolic acidosis: Resolved Patient staying with a creatinine of 3.45.  Review of EMR notable for baseline creatinine 1.9 2018. -- Cr 3.45>>2.86>2.74>2.36 -- serum CO2 16>>24>27 -- Sodium bicarb drip at 100 mL/h -- Continue to hold home olmesartan -- BMP daily -- Would benefit from outpatient nephrology follow-up  Upper GI bleed secondary to peptic ulcer Patient presenting  with a hemoglobin of 10.9, baseline 13.  FOBT positive.  History of peptic ulcer disease.  Patient underwent ERCP on 06/19/2023 with findings of 8 mm ulcer prepyloric area without active bleeding.  Anemia panel with iron 62, TIBC 220, ferritin 28, folate 4.8, vitamin B12 459. -- Hgb 10.9>>8.6>9.3>8.9>8.7 --Folic acid 1 mg p.o. daily -- CBC daily  Choledocholithiasis Chronic cholelithiasis CT abdomen/pelvis with persistent cholelithiasis/choledocholithiasis with slightly increased size of bile duct.  GI was consulted and patient underwent ERCP 8/12 with findings significant for CBD dilated to 15 mm with multiple filling defects s/p sphincterotomy and sweeping of the biliary tree with extraction of stones, nonfilling of the cystic duct/gallbladder.  General surgery was consulted and patient underwent laparoscopic cholecystectomy with lysis of adhesions by Dr. Fredricka Bonine on 06/21/2023.  -- Diet advanced to regular this morning  Folic acid deficiency Folic acid low at 4.8. -- Folic acid 1 mg p.o. daily  Essential hypertension -- Amlodipine 5 mg p.o. daily -- Atenolol 25 mg p.o. daily  Hyperkalemia: Resolved Hypokalemia Potassium 5.5 on admission, likely secondary to acute renal failure.  Improved with IV fluid hydration, now 3.2.   -- Will replete potassium today -- BMP in the a.m.  Hypomagnesemia Magnesium 1.4, will replete. -- Repeat magnesium level in a.m.  Hyponatremia: Resolved Etiology likely secondary to poor oral intake versus outpatient hydrochlorothiazide use. -- Na 124>>134>136>135 --HCTZ discontinued -- Continue sodium bicarb drip as above -- BMP daily  Right foot pain Patient course intermittent pain to her right foot.  X-ray with no acute findings. Resting right and left ankle-brachial index indicates noncompressible right lower extremity arteries with abnormal toe brachial index. --  Recommend outpatient follow-up with vascular  surgery.  Weakness/debility/deconditioning: Patient ambulates with the use of a walker/cane at baseline. -- PT evaluation: Pending -- OT: Recommending SNF   DVT prophylaxis: SCDs Start: 06/17/23 0647    Code Status: DNR Family Communication: No family present at bedside this morning  Disposition Plan:  Level of care: Telemetry Medical Status is: Inpatient Remains inpatient appropriate because: Needs further advancement of diet with toleration, pending sign off from general surgery, OT currently recommending SNF, awaiting PT evaluation; possible discharge home tomorrow    Consultants:  [facility]  Procedures:  ERCP 8/12 Laparoscopic cholecystectomy with LOA 8/14, Dr. Fredricka Bonine  Antimicrobials:  Ceftriaxone 8/9 - 8/9  fosfomycin 8/10 - 8/10  ciprofloxacin 8/12 - 8/12   Subjective: Patient seen examined bedside, resting comfortably.  Lying in bed.  Patient reported some nausea and pain yesterday, relieved with medication.  No specific complaints this morning.  Seen by general surgery, diet advanced to regular today.  Seen by OT yesterday with recommendation of SNF, awaiting PT evaluation.  Hopeful to progress to be able to return home tomorrow.  No other questions or concerns at this time.  Denies headache, no dizziness, no chest pain, no palpitations, no shortness of breath, no abdominal pain, no focal weakness, no fatigue, no paresthesias.  No acute events overnight per nursing staff.  Objective: Vitals:   06/21/23 1949 06/22/23 0231 06/22/23 0556 06/22/23 0747  BP: (!) 184/57 (!) 140/59 (!) 152/45 (!) 129/56  Pulse: 70 68 72 67  Resp: 15 16  18   Temp: 97.6 F (36.4 C) 97.6 F (36.4 C) 97.6 F (36.4 C) 97.6 F (36.4 C)  TempSrc: Oral Oral Oral Oral  SpO2: 96% 97% 94% 94%  Weight:      Height:        Intake/Output Summary (Last 24 hours) at 06/22/2023 1610 Last data filed at 06/22/2023 9604 Gross per 24 hour  Intake 560 ml  Output 280 ml  Net 280 ml   Filed Weights    06/18/23 0607 06/19/23 1210 06/21/23 1101  Weight: 63.3 kg 63.3 kg 64 kg    Examination:  Physical Exam: GEN: NAD, alert and oriented x 3, elderly in appearance HEENT: NCAT, PERRL, EOMI, sclera clear, MMM PULM: CTAB w/o wheezes/crackles, normal respiratory effort, on room air CV: RRR w/o M/G/R GI: abd soft, NTND, NABS, no R/G/M, noted laparoscopic port sites to abdomen with Dermabond in place, no surrounding fluctuance/erythema MSK: no peripheral edema, moves all extremities dependently NEURO: No focal neurological deficit PSYCH: normal mood/affect Integumentary: Surgical port sites as above, otherwise no other concerning rashes/lesions/wounds noted on exposed skin surfaces.     Data Reviewed: I have personally reviewed following labs and imaging studies  CBC: Recent Labs  Lab 06/16/23 1905 06/17/23 0702 06/18/23 0350 06/19/23 0149 06/21/23 0641 06/22/23 0157  WBC 13.7*  --  9.5 9.6 13.4* 10.0  NEUTROABS 10.6*  --   --   --   --   --   HGB 10.9* 9.6* 8.6* 9.3* 8.9* 8.7*  HCT 34.1* 29.4* 26.3* 28.8* 27.2* 26.1*  MCV 96.1  --  93.6 97.0 92.5 94.2  PLT 222  --  170 181 214 179   Basic Metabolic Panel: Recent Labs  Lab 06/18/23 0350 06/19/23 0149 06/20/23 0307 06/21/23 0641 06/22/23 0157  NA 130* 131* 134* 136 135  K 4.3 4.2 5.1 3.6 3.2*  CL 106 108 109 103 99  CO2 15* 16* 15* 24 27  GLUCOSE 88 89 132*  139* 120*  BUN 43* 37* 37* 31* 30*  CREATININE 3.05* 2.80* 2.86* 2.74* 2.36*  CALCIUM 7.7* 7.8* 8.0* 7.9* 7.3*  MG 1.4* 2.1  --   --  1.4*   GFR: Estimated Creatinine Clearance: 15.7 mL/min (A) (by C-G formula based on SCr of 2.36 mg/dL (H)). Liver Function Tests: Recent Labs  Lab 06/16/23 1905 06/20/23 0307 06/22/23 0157  AST 15 24 59*  ALT 14 19 38  ALKPHOS 65 54 39  BILITOT 0.6 0.4 0.4  PROT 7.4 5.2* 4.8*  ALBUMIN 3.5 2.2* 2.0*   Recent Labs  Lab 06/20/23 0307 06/22/23 0157  LIPASE 39 70*   No results for input(s): "AMMONIA" in the last 168  hours. Coagulation Profile: No results for input(s): "INR", "PROTIME" in the last 168 hours. Cardiac Enzymes: No results for input(s): "CKTOTAL", "CKMB", "CKMBINDEX", "TROPONINI" in the last 168 hours. BNP (last 3 results) No results for input(s): "PROBNP" in the last 8760 hours. HbA1C: No results for input(s): "HGBA1C" in the last 72 hours. CBG: Recent Labs  Lab 06/18/23 0852  GLUCAP 96   Lipid Profile: No results for input(s): "CHOL", "HDL", "LDLCALC", "TRIG", "CHOLHDL", "LDLDIRECT" in the last 72 hours. Thyroid Function Tests: No results for input(s): "TSH", "T4TOTAL", "FREET4", "T3FREE", "THYROIDAB" in the last 72 hours. Anemia Panel: No results for input(s): "VITAMINB12", "FOLATE", "FERRITIN", "TIBC", "IRON", "RETICCTPCT" in the last 72 hours. Sepsis Labs: No results for input(s): "PROCALCITON", "LATICACIDVEN" in the last 168 hours.  Recent Results (from the past 240 hour(s))  Surgical pcr screen     Status: None   Collection Time: 06/20/23 11:05 PM   Specimen: Nasal Mucosa; Nasal Swab  Result Value Ref Range Status   MRSA, PCR NEGATIVE NEGATIVE Final   Staphylococcus aureus NEGATIVE NEGATIVE Final    Comment: (NOTE) The Xpert SA Assay (FDA approved for NASAL specimens in patients 20 years of age and older), is one component of a comprehensive surveillance program. It is not intended to diagnose infection nor to guide or monitor treatment. Performed at [facility], [address], [place], [place] [address]          Radiology Studies: VAS [place] ABI WITH/WO TBI  Result Date: 06/20/2023  LOWER EXTREMITY DOPPLER STUDY Patient Name:  DEBORHA SI  Date of Exam:   06/20/2023 Medical Rec #: [id number]       Accession #:    [id number] Date of Birth: June 28, 1936       Patient Gender: F Patient Age:   53 years Exam Location:  [facility] Procedure:      VAS [place] ABI WITH/WO TBI Referring Phys: Calvert Cantor  --------------------------------------------------------------------------------  Indications: Rest pain. High Risk Factors: Hypertension, past history of smoking.  Comparison Study: No previous study. Performing Technologist: McKayla Maag RVT, [place]  Examination Guidelines: A complete evaluation includes at minimum, Doppler waveform signals and systolic blood pressure reading at the level of bilateral brachial, anterior tibial, and posterior tibial arteries, when vessel segments are accessible. Bilateral testing is considered an integral part of a complete examination. Photoelectric Plethysmograph (PPG) waveforms and toe systolic pressure readings are included as required and additional duplex testing as needed. Limited examinations for reoccurring indications may be performed as noted.  ABI Findings: +---------+--------------+-----+-------------------+---------------------------+ Right    Rt Pressure   IndexWaveform           Comment                              (  mmHg)                                                            +---------+--------------+-----+-------------------+---------------------------+ Brachial 154                biphasic                                       +---------+--------------+-----+-------------------+---------------------------+ PTA      255           1.66 monophasic         Non-compressible            +---------+--------------+-----+-------------------+---------------------------+ DP                          dampened monophasicWaveform obtained with 2D                                                  imaging. No pressure                                                       obtained.                   +---------+--------------+-----+-------------------+---------------------------+ Great Toe39            0.25 Abnormal                                       +---------+--------------+-----+-------------------+---------------------------+  +---------+------------------+-----+----------+----------------+ Left     Lt Pressure (mmHg)IndexWaveform  Comment          +---------+------------------+-----+----------+----------------+ PTA      255               1.66 monophasicNon-compressible +---------+------------------+-----+----------+----------------+ DP       96                0.62 monophasic                 +---------+------------------+-----+----------+----------------+ Great Toe57                0.37 Abnormal                   +---------+------------------+-----+----------+----------------+ +-------+----------------+-----------+------------+------------+ ABI/TBIToday's ABI     Today's TBIPrevious ABIPrevious TBI +-------+----------------+-----------+------------+------------+ Right  Non-compressible0.25                                +-------+----------------+-----------+------------+------------+ Left   Non-compressible0.37                                +-------+----------------+-----------+------------+------------+  Summary: Right: Resting right ankle-brachial index indicates noncompressible right lower extremity arteries. The right toe-brachial index is abnormal. Left: Resting left  ankle-brachial index indicates noncompressible left lower extremity arteries. The left toe-brachial index is abnormal. *See table(s) above for measurements and observations.  Electronically signed by Coral Else MD on 06/20/2023 at 9:06:27 PM.    Final    ECHOCARDIOGRAM COMPLETE  Result Date: 06/20/2023    ECHOCARDIOGRAM REPORT   Patient Name:   ULYANA HARDBARGER Date of Exam: 06/20/2023 Medical Rec #:  [id number]      Height:       65.0 in Accession #:    [id number]     Weight:       139.6 lb Date of Birth:  April 09, 1936      BSA:          1.698 m Patient Age:    87 years       BP:           149/58 mmHg Patient Gender: F              HR:           82 bpm. Exam Location:  Inpatient Procedure: 2D Echo, Cardiac Doppler and Color  Doppler Indications:    Pre-Op  History:        Patient has no prior history of Echocardiogram examinations.                 CAD; Risk Factors:Hypertension.  Sonographer:    Darlys Gales Referring Phys: Leone Brand IMPRESSIONS  1. Left ventricular ejection fraction, by estimation, is 60 to 65%. The left ventricle has normal function. The left ventricle has no regional wall motion abnormalities. Left ventricular diastolic parameters are indeterminate.  2. Right ventricular systolic function is normal. The right ventricular size is normal.  3. The mitral valve is degenerative. No evidence of mitral valve regurgitation. Mild mitral stenosis. The mean mitral valve gradient is 7.0 mmHg with average heart rate of 82 bpm. Severe mitral annular calcification.  4. The aortic valve was not well visualized. Aortic valve regurgitation is moderate to severe. Aortic valve sclerosis is present, with no evidence of aortic valve stenosis. Aortic regurgitation PHT measures 300 msec. Comparison(s): No prior Echocardiogram. FINDINGS  Left Ventricle: Left ventricular ejection fraction, by estimation, is 60 to 65%. The left ventricle has normal function. The left ventricle has no regional wall motion abnormalities. The left ventricular internal cavity size was normal in size. There is  no left ventricular hypertrophy. Left ventricular diastolic parameters are indeterminate. Right Ventricle: The right ventricular size is normal. No increase in right ventricular wall thickness. Right ventricular systolic function is normal. Left Atrium: Left atrial size was normal in size. Right Atrium: Right atrial size was normal in size. Pericardium: Trivial pericardial effusion is present. Mitral Valve: 2D Mitral valve area 3.48 cm2. The mitral valve is degenerative in appearance. Severe mitral annular calcification. No evidence of mitral valve regurgitation. Mild mitral valve stenosis. MV peak gradient, 10.2 mmHg. The mean mitral valve gradient is  7.0 mmHg with average heart rate of 82 bpm. Tricuspid Valve: The tricuspid valve is normal in structure. Tricuspid valve regurgitation is not demonstrated. No evidence of tricuspid stenosis. Aortic Valve: The aortic valve was not well visualized. Aortic valve regurgitation is moderate to severe. Aortic regurgitation PHT measures 300 msec. Aortic valve sclerosis is present, with no evidence of aortic valve stenosis. Aortic valve mean gradient  measures 6.0 mmHg. Aortic valve peak gradient measures 10.4 mmHg. Aortic valve area, by VTI measures 2.00 cm. Pulmonic Valve: The pulmonic valve was normal in structure. Pulmonic  valve regurgitation is mild. No evidence of pulmonic stenosis. Aorta: The aortic root is normal in size and structure. IAS/Shunts: The interatrial septum was not well visualized.  LEFT VENTRICLE PLAX 2D LVIDd:         4.30 cm   Diastology LVIDs:         3.00 cm   LV e' medial:    5.98 cm/s LV PW:         1.00 cm   LV E/e' medial:  25.4 LV IVS:        0.80 cm   LV e' lateral:   5.66 cm/s LVOT diam:     1.80 cm   LV E/e' lateral: 26.9 LV SV:         84 LV SV Index:   49 LVOT Area:     2.54 cm  RIGHT VENTRICLE RV S prime:     14.80 cm/s TAPSE (M-mode): 2.2 cm LEFT ATRIUM             Index        RIGHT ATRIUM           Index LA Vol (A2C):   41.8 ml 24.62 ml/m  RA Area:     13.70 cm LA Vol (A4C):   35.7 ml 21.03 ml/m  RA Volume:   30.20 ml  17.79 ml/m LA Biplane Vol: 40.0 ml 23.56 ml/m  AORTIC VALVE AV Area (Vmax):    2.21 cm AV Area (Vmean):   1.96 cm AV Area (VTI):     2.00 cm AV Vmax:           161.00 cm/s AV Vmean:          122.000 cm/s AV VTI:            0.420 m AV Peak Grad:      10.4 mmHg AV Mean Grad:      6.0 mmHg LVOT Vmax:         140.00 cm/s LVOT Vmean:        94.100 cm/s LVOT VTI:          0.330 m LVOT/AV VTI ratio: 0.79 AI PHT:            300 msec  AORTA Ao Root diam: 1.90 cm Ao Asc diam:  2.60 cm MITRAL VALVE                TRICUSPID VALVE MV Area (PHT): 3.42 cm     TR Peak grad:    32.9 mmHg MV Area VTI:   2.14 cm     TR Vmax:        287.00 cm/s MV Peak grad:  10.2 mmHg MV Mean grad:  7.0 mmHg     SHUNTS MV Vmax:       1.60 m/s     Systemic VTI:  0.33 m MV Vmean:      125.3 cm/s   Systemic Diam: 1.80 cm MV Decel Time: 222 msec MV E velocity: 152.00 cm/s MV A velocity: 148.00 cm/s MV E/A ratio:  1.03 Riley Lam MD Electronically signed by Riley Lam MD Signature Date/Time: 06/20/2023/3:59:58 PM    Final         Scheduled Meds:  acetaminophen  650 mg Oral Q6H   Or   acetaminophen  650 mg Rectal Q6H   [START ON 06/23/2023] amLODipine  10 mg Oral Daily   atenolol  25 mg Oral Daily   docusate sodium  100 mg Oral BID  folic acid  1 mg Oral Daily   pantoprazole  40 mg Oral BID   potassium chloride  40 mEq Oral Q4H   Continuous Infusions:     LOS: 5 days    Time spent: 48 minutes spent on chart review, discussion with nursing staff, consultants, updating family and interview/physical exam; more than 50% of that time was spent in counseling and/or coordination of care.    Alvira Philips [place], DO [facility] Available via Epic secure chat 7am-7pm After these hours, please refer to coverage provider listed on [email or web address] 06/22/2023, 9:42 AM

## 2023-06-22 NOTE — Evaluation (Signed)
Physical Therapy Evaluation Patient Details Name: Amy [name] MRN: [id number] DOB: November 28, 1935 Today's Date: 06/22/2023  History of Present Illness  Patient is and 87 y.o. female presented to [facility] on 8/9 for worsening kidney function and admitted for acute renal failure on CKD and with FOBT. Pt underwent ECRP 8/12 with findings of choledocholithiasis now s/p lap chole with lysis of adhesions on 8/14. PMH HTN GERD CKD III.   Clinical Impression  DOY GARA is 87 y.o. female admitted with above HPI and diagnosis. Patient is currently limited by functional impairments below (see PT problem list). Patient lives alone and reports mod ind with occasional use of AD for mobility in/out of home at baseline. Currently pt requires min assist to complete bed mobility and sit<>stand transfers with RW. Pt limited greatly but functional LE weakness and bil LE's buckling after ~3' ambulation requiring Mod-Max assist to maintain balance. Patient will benefit from continued skilled PT interventions to address impairments and progress independence with mobility. Patient will benefit from continued inpatient follow up therapy, <3 hours/day. Acute PT will follow and progress as able.         If plan is discharge home, recommend the following: Two people to help with walking and/or transfers;A lot of help with bathing/dressing/bathroom;Assistance with cooking/housework;Help with stairs or ramp for entrance;Supervision due to cognitive status;Assist for transportation;Direct supervision/assist for medications management   Can travel by private vehicle   No    Equipment Recommendations Rolling walker (2 wheels) (defer to next venue)  Recommendations for Other Services       Functional Status Assessment Patient has had a recent decline in their functional status and demonstrates the ability to make significant improvements in function in a reasonable and predictable amount of time.     Precautions /  Restrictions Precautions Precautions: Fall Precaution Comments: JP drain Restrictions Weight Bearing Restrictions: No      Mobility  Bed Mobility Overal bed mobility: Needs Assistance Bed Mobility: Supine to Sit     Supine to sit: Min assist, HOB elevated, Used rails     General bed mobility comments: cues for walking LE's off EOB and to pivot trunk with use of bed rail. Min assist with use of bed pad to fully sit up and scoot to edge.    Transfers Overall transfer level: Needs assistance Equipment used: Rolling walker (2 wheels) Transfers: Sit to/from Stand Sit to Stand: Min assist           General transfer comment: Cues for hand placement and min assist to power up to stand.    Ambulation/Gait Ambulation/Gait assistance: Min assist, Mod assist Gait Distance (Feet): 3 Feet Assistive device: Rolling walker (2 wheels) Gait Pattern/deviations: Step-to pattern, Decreased stride length, Shuffle, Trunk flexed Gait velocity: decr     General Gait Details: cues for position to RW to increase pt's UE use to support LE. after small forward steps pt's LE's buckling and Mod assist required with cues to activate gluteals and use UE's to take additaional short steps and turn to sit in recliner.  Stairs            Wheelchair Mobility     Tilt Bed    Modified Rankin (Stroke Patients Only)       Balance Overall balance assessment: Needs assistance Sitting-balance support: Bilateral upper extremity supported, Feet supported Sitting balance-Leahy Scale: Fair     Standing balance support: Bilateral upper extremity supported, During functional activity, Reliant on assistive device for balance Standing balance-Leahy Scale:  Poor Standing balance comment: pt reliant on single/bil UE support to stand at sink and complete ADL's. fatigues quickly.                             Pertinent Vitals/Pain Pain Assessment Pain Assessment: No/denies pain    Home  Living Family/patient expects to be discharged to:: Private residence Living Arrangements: Alone Available Help at Discharge: Family;Available PRN/intermittently Type of Home: House Home Access: Stairs to enter Entrance Stairs-Rails: Right Entrance Stairs-Number of Steps: 2   Home Layout: One level Home Equipment: Architectural technologist (4 wheels);Toilet riser;Shower seat Additional Comments: pt's daughter does her grocery shopping 1x/month and assist with managin IADL's pt reports managing at home herself but has been gradually getting more limited in last week prior to admission    Prior Function Prior Level of Function : Independent/Modified Independent             Mobility Comments: pt reports she was furniture surfing in home and using [facility] if needed outside of home. ADLs Comments: indep prior to admission per patient.     Extremity/Trunk Assessment             Cervical / Trunk Assessment Cervical / Trunk Assessment: Kyphotic  Communication   Communication Communication: Hearing impairment  Cognition Arousal: Alert Behavior During Therapy: WFL for tasks assessed/performed Overall Cognitive Status: No family/caregiver present to determine baseline cognitive functioning Area of Impairment: Attention, Following commands, Safety/judgement, Problem solving, Awareness                   Current Attention Level: Selective   Following Commands: Follows one step commands with increased time, Follows multi-step commands with increased time, Follows multi-step commands inconsistently, Follows one step commands consistently Safety/Judgement: Decreased awareness of deficits Awareness: Emergent Problem Solving: Decreased initiation, Difficulty sequencing, Requires verbal cues          General Comments      Exercises     Assessment/Plan    PT Assessment Patient needs continued PT services  PT Problem List Decreased strength;Decreased range of motion;Decreased  activity tolerance;Decreased balance;Decreased mobility;Decreased coordination;Decreased cognition;Decreased knowledge of use of DME;Decreased safety awareness;Decreased knowledge of precautions;Cardiopulmonary status limiting activity;Obesity       PT Treatment Interventions DME instruction;Gait training;Stair training;Functional mobility training;Therapeutic activities;Therapeutic exercise;Balance training;Neuromuscular re-education;Cognitive remediation;Patient/family education    PT Goals (Current goals can be found in the Care Plan section)  Acute Rehab PT Goals Patient Stated Goal: regain strength PT Goal Formulation: With patient Time For Goal Achievement: 07/06/23 Potential to Achieve Goals: Good    Frequency Min 1X/week     Co-evaluation               AM-PAC PT "6 Clicks" Mobility  Outcome Measure Help needed turning from your back to your side while in a flat bed without using bedrails?: A Little Help needed moving from lying on your back to sitting on the side of a flat bed without using bedrails?: A Little Help needed moving to and from a bed to a chair (including a wheelchair)?: A Little Help needed standing up from a chair using your arms (e.g., wheelchair or bedside chair)?: A Little Help needed to walk in hospital room?: Total Help needed climbing 3-5 steps with a railing? : Total 6 Click Score: 14    End of Session Equipment Utilized During Treatment: Gait belt Activity Tolerance: Patient tolerated treatment well Patient left: in chair;with call bell/phone within  reach;with chair alarm set Nurse Communication: Mobility status PT Visit Diagnosis: Muscle weakness (generalized) (M62.81);Difficulty in walking, not elsewhere classified (R26.2);Other abnormalities of gait and mobility (R26.89);Unsteadiness on feet (R26.81)      06/22/23 1051  PT Time Calculation  PT Start Time (ACUTE ONLY) 1048  PT Stop Time (ACUTE ONLY) 1119  PT Time Calculation (min)  (ACUTE ONLY) 31 min  PT General Charges  $$ ACUTE PT VISIT 1 Visit  PT Evaluation  $PT Eval Moderate Complexity 1 Mod  PT Treatments  $Therapeutic Activity 8-22 mins         Wynn Maudlin, DPT Acute Rehabilitation Services Office [phone]  06/22/23 5:00 PM

## 2023-06-22 NOTE — Progress Notes (Signed)
1 Day Post-Op   Subjective/Chief Complaint: Some pain overnight, controlled with tylenol. Nausea yesterday afternoon, better now   Objective: Vital signs in last 24 hours: Temp:  [97.5 F (36.4 C)-98.1 F (36.7 C)] 97.6 F (36.4 C) (08/15 0747) Pulse Rate:  [66-81] 67 (08/15 0747) Resp:  [15-22] 18 (08/15 0747) BP: (129-184)/(45-63) 129/56 (08/15 0747) SpO2:  [94 %-100 %] 94 % (08/15 0747) Weight:  [64 kg] 64 kg (08/14 1101) Last BM Date : 06/19/23  Intake/Output from previous day: 08/14 0701 - 08/15 0700 In: 560 [P.O.:60; I.V.:500] Out: 375 [Urine:300; Drains:50; Blood:25] Intake/Output this shift: Total I/O In: -  Out: 55 [Drains:55]  Alert, well appearing Unlabored respirations Abd soft, nondistended, appropriately tender around incisions which are c/d/I with dermabond, no cellulitis or hematoma Drain output is serosanguinous  Lab Results:  Recent Labs    06/21/23 0641 06/22/23 0157  WBC 13.4* 10.0  HGB 8.9* 8.7*  HCT 27.2* 26.1*  PLT 214 179   BMET Recent Labs    06/21/23 0641 06/22/23 0157  NA 136 135  K 3.6 3.2*  CL 103 99  CO2 24 27  GLUCOSE 139* 120*  BUN 31* 30*  CREATININE 2.74* 2.36*  CALCIUM 7.9* 7.3*   PT/INR No results for input(s): "LABPROT", "INR" in the last 72 hours. ABG No results for input(s): "PHART", "HCO3" in the last 72 hours.  Invalid input(s): "PCO2", "PO2"  Studies/Results: VAS [place] ABI WITH/WO TBI  Result Date: 06/20/2023  LOWER EXTREMITY DOPPLER STUDY Patient Name:  Amy [name]  Date of Exam:   06/20/2023 Medical Rec #: [id number]       Accession #:    [id number] Date of Birth: January 13, 1936       Patient Gender: F Patient Age:   56 years Exam Location:  [facility] Procedure:      VAS [place] ABI WITH/WO TBI Referring Phys: Calvert Cantor --------------------------------------------------------------------------------  Indications: Rest pain. High Risk Factors: Hypertension, past history of smoking.  Comparison Study: No  previous study. Performing Technologist: McKayla Maag RVT, [place]  Examination Guidelines: A complete evaluation includes at minimum, Doppler waveform signals and systolic blood pressure reading at the level of bilateral brachial, anterior tibial, and posterior tibial arteries, when vessel segments are accessible. Bilateral testing is considered an integral part of a complete examination. Photoelectric Plethysmograph (PPG) waveforms and toe systolic pressure readings are included as required and additional duplex testing as needed. Limited examinations for reoccurring indications may be performed as noted.  ABI Findings: +---------+--------------+-----+-------------------+---------------------------+ Right    Rt Pressure   IndexWaveform           Comment                              (mmHg)                                                            +---------+--------------+-----+-------------------+---------------------------+ Brachial 154                biphasic                                       +---------+--------------+-----+-------------------+---------------------------+ PTA  255           1.66 monophasic         Non-compressible            +---------+--------------+-----+-------------------+---------------------------+ DP                          dampened monophasicWaveform obtained with 2D                                                  imaging. No pressure                                                       obtained.                   +---------+--------------+-----+-------------------+---------------------------+ Great Toe39            0.25 Abnormal                                       +---------+--------------+-----+-------------------+---------------------------+ +---------+------------------+-----+----------+----------------+ Left     Lt Pressure (mmHg)IndexWaveform  Comment           +---------+------------------+-----+----------+----------------+ PTA      255               1.66 monophasicNon-compressible +---------+------------------+-----+----------+----------------+ DP       96                0.62 monophasic                 +---------+------------------+-----+----------+----------------+ Great Toe57                0.37 Abnormal                   +---------+------------------+-----+----------+----------------+ +-------+----------------+-----------+------------+------------+ ABI/TBIToday's ABI     Today's TBIPrevious ABIPrevious TBI +-------+----------------+-----------+------------+------------+ Right  Non-compressible0.25                                +-------+----------------+-----------+------------+------------+ Left   Non-compressible0.37                                +-------+----------------+-----------+------------+------------+  Summary: Right: Resting right ankle-brachial index indicates noncompressible right lower extremity arteries. The right toe-brachial index is abnormal. Left: Resting left ankle-brachial index indicates noncompressible left lower extremity arteries. The left toe-brachial index is abnormal. *See table(s) above for measurements and observations.  Electronically signed by Coral Else MD on 06/20/2023 at 9:06:27 PM.    Final    ECHOCARDIOGRAM COMPLETE  Result Date: 06/20/2023    ECHOCARDIOGRAM REPORT   Patient Name:   Amy [name] Date of Exam: 06/20/2023 Medical Rec #:  [id number]      Height:       65.0 in Accession #:    [id number]     Weight:       139.6 lb Date of Birth:  [birth year]      BSA:          1.698 m  Patient Age:    87 years       BP:           149/58 mmHg Patient Gender: F              HR:           82 bpm. Exam Location:  Inpatient Procedure: 2D Echo, Cardiac Doppler and Color Doppler Indications:    Pre-Op  History:        Patient has no prior history of Echocardiogram examinations.                 CAD;  Risk Factors:Hypertension.  Sonographer:    Darlys Gales Referring Phys: Leone Brand IMPRESSIONS  1. Left ventricular ejection fraction, by estimation, is 60 to 65%. The left ventricle has normal function. The left ventricle has no regional wall motion abnormalities. Left ventricular diastolic parameters are indeterminate.  2. Right ventricular systolic function is normal. The right ventricular size is normal.  3. The mitral valve is degenerative. No evidence of mitral valve regurgitation. Mild mitral stenosis. The mean mitral valve gradient is 7.0 mmHg with average heart rate of 82 bpm. Severe mitral annular calcification.  4. The aortic valve was not well visualized. Aortic valve regurgitation is moderate to severe. Aortic valve sclerosis is present, with no evidence of aortic valve stenosis. Aortic regurgitation PHT measures 300 msec. Comparison(s): No prior Echocardiogram. FINDINGS  Left Ventricle: Left ventricular ejection fraction, by estimation, is 60 to 65%. The left ventricle has normal function. The left ventricle has no regional wall motion abnormalities. The left ventricular internal cavity size was normal in size. There is  no left ventricular hypertrophy. Left ventricular diastolic parameters are indeterminate. Right Ventricle: The right ventricular size is normal. No increase in right ventricular wall thickness. Right ventricular systolic function is normal. Left Atrium: Left atrial size was normal in size. Right Atrium: Right atrial size was normal in size. Pericardium: Trivial pericardial effusion is present. Mitral Valve: 2D Mitral valve area 3.48 cm2. The mitral valve is degenerative in appearance. Severe mitral annular calcification. No evidence of mitral valve regurgitation. Mild mitral valve stenosis. MV peak gradient, 10.2 mmHg. The mean mitral valve gradient is 7.0 mmHg with average heart rate of 82 bpm. Tricuspid Valve: The tricuspid valve is normal in structure. Tricuspid valve  regurgitation is not demonstrated. No evidence of tricuspid stenosis. Aortic Valve: The aortic valve was not well visualized. Aortic valve regurgitation is moderate to severe. Aortic regurgitation PHT measures 300 msec. Aortic valve sclerosis is present, with no evidence of aortic valve stenosis. Aortic valve mean gradient  measures 6.0 mmHg. Aortic valve peak gradient measures 10.4 mmHg. Aortic valve area, by VTI measures 2.00 cm. Pulmonic Valve: The pulmonic valve was normal in structure. Pulmonic valve regurgitation is mild. No evidence of pulmonic stenosis. Aorta: The aortic root is normal in size and structure. IAS/Shunts: The interatrial septum was not well visualized.  LEFT VENTRICLE PLAX 2D LVIDd:         4.30 cm   Diastology LVIDs:         3.00 cm   LV e' medial:    5.98 cm/s LV PW:         1.00 cm   LV E/e' medial:  25.4 LV IVS:        0.80 cm   LV e' lateral:   5.66 cm/s LVOT diam:     1.80 cm   LV E/e' lateral: 26.9 LV SV:  84 LV SV Index:   49 LVOT Area:     2.54 cm  RIGHT VENTRICLE RV S prime:     14.80 cm/s TAPSE (M-mode): 2.2 cm LEFT ATRIUM             Index        RIGHT ATRIUM           Index LA Vol (A2C):   41.8 ml 24.62 ml/m  RA Area:     13.70 cm LA Vol (A4C):   35.7 ml 21.03 ml/m  RA Volume:   30.20 ml  17.79 ml/m LA Biplane Vol: 40.0 ml 23.56 ml/m  AORTIC VALVE AV Area (Vmax):    2.21 cm AV Area (Vmean):   1.96 cm AV Area (VTI):     2.00 cm AV Vmax:           161.00 cm/s AV Vmean:          122.000 cm/s AV VTI:            0.420 m AV Peak Grad:      10.4 mmHg AV Mean Grad:      6.0 mmHg LVOT Vmax:         140.00 cm/s LVOT Vmean:        94.100 cm/s LVOT VTI:          0.330 m LVOT/AV VTI ratio: 0.79 AI PHT:            300 msec  AORTA Ao Root diam: 1.90 cm Ao Asc diam:  2.60 cm MITRAL VALVE                TRICUSPID VALVE MV Area (PHT): 3.42 cm     TR Peak grad:   32.9 mmHg MV Area VTI:   2.14 cm     TR Vmax:        287.00 cm/s MV Peak grad:  10.2 mmHg MV Mean grad:  7.0 mmHg      SHUNTS MV Vmax:       1.60 m/s     Systemic VTI:  0.33 m MV Vmean:      125.3 cm/s   Systemic Diam: 1.80 cm MV Decel Time: 222 msec MV E velocity: 152.00 cm/s MV A velocity: 148.00 cm/s MV E/A ratio:  1.03 Riley Lam MD Electronically signed by Riley Lam MD Signature Date/Time: 06/20/2023/3:59:58 PM    Final     Anti-infectives: Anti-infectives (From admission, onward)    Start     Dose/Rate Route Frequency Ordered Stop   06/21/23 1059  ceFAZolin (ANCEF) 2-4 GM/100ML-% IVPB       Note to Pharmacy: Marijo Sanes B: cabinet override      06/21/23 1059 06/21/23 2314   06/21/23 0600  ceFAZolin (ANCEF) IVPB 2g/100 mL premix        2 g 200 mL/hr over 30 Minutes Intravenous On call to O.R. 06/20/23 1307 06/21/23 1027   06/19/23 1215  ampicillin-sulbactam (UNASYN) 1.5 g in sodium chloride 0.9 % 100 mL IVPB  Status:  Discontinued        1.5 g 200 mL/hr over 30 Minutes Intravenous  Once 06/19/23 1214 06/19/23 1547   06/17/23 1400  fosfomycin (MONUROL) packet 3 g        3 g Oral  Once 06/17/23 1300 06/17/23 1720   06/16/23 2315  cefTRIAXone (ROCEPHIN) 1 g in sodium chloride 0.9 % 100 mL IVPB        1 g 200 mL/hr over 30  Minutes Intravenous  Once 06/16/23 2313 06/17/23 0509       Assessment/Plan: Choledocholithiasis - s/p ERCP 8/12: Choledocholithiasis s/p biliary sphincterotomy, sphincteroplasty and stone extraction; nonfilling of the cystic duct/ gallbladder -cleared by cardiology, appreciate evaluation -s/p lap chole 8/14 with lysis of adhesions. Chronic cholecystitis and dense scarring of posterior liver/GB to retroperitoneum from prior open adrenalectomy   ID - none currently FEN - CLD, advance as able VTE - SCDs, per primary Foley - none        Berna Bue, MD [facility] Surgery 06/22/2023 7:49 AM  Please see Amion for pager number during day hours 7:00am-4:30pm

## 2023-06-23 DIAGNOSIS — N179 Acute kidney failure, unspecified: Secondary | ICD-10-CM | POA: Diagnosis not present

## 2023-06-23 LAB — BASIC METABOLIC PANEL
Anion gap: 11 (ref 5–15)
BUN: 35 mg/dL — ABNORMAL HIGH (ref 8–23)
CO2: 24 mmol/L (ref 22–32)
Calcium: 7.4 mg/dL — ABNORMAL LOW (ref 8.9–10.3)
Chloride: 97 mmol/L — ABNORMAL LOW (ref 98–111)
Creatinine, Ser: 2.54 mg/dL — ABNORMAL HIGH (ref 0.44–1.00)
GFR, Estimated: 18 mL/min — ABNORMAL LOW (ref >60)
Glucose, Bld: 151 mg/dL — ABNORMAL HIGH (ref 70–99)
Potassium: 4 mmol/L (ref 3.5–5.1)
Sodium: 132 mmol/L — ABNORMAL LOW (ref 135–145)

## 2023-06-23 LAB — MAGNESIUM: Magnesium: 2.6 mg/dL — ABNORMAL HIGH (ref 1.7–2.4)

## 2023-06-23 MED ORDER — POLYETHYLENE GLYCOL 3350 17 G PO PACK
17.0000 g | PACK | Freq: Every day | ORAL | Status: DC
  Administered 2023-06-23 – 2023-06-26 (×3): 17 g via ORAL
  Filled 2023-06-23 (×4): qty 1

## 2023-06-23 MED ORDER — BISACODYL 10 MG RE SUPP: 10.0000 mg | Freq: Every day | RECTAL | Status: DC | PRN

## 2023-06-23 MED ORDER — ENSURE ENLIVE PO LIQD
237.0000 mL | Freq: Three times a day (TID) | ORAL | Status: DC
  Administered 2023-06-23 – 2023-06-28 (×9): 237 mL via ORAL

## 2023-06-23 NOTE — Plan of Care (Signed)

## 2023-06-23 NOTE — TOC Progression Note (Signed)
Transition of Care [facility]) - Progression Note    Patient Details  Name: Amy [name] MRN: [id number] Date of Birth: 26-Dec-1935  [facility] CM/SW Contact  Ronald Londo A [place], [place] Phone Number: 06/23/2023, 10:38 AM  Clinical Narrative:     CSW met with pt at bedside to discuss SNF disposition. She stated, pt's daughter is POA,  Addison Naegeli, [phone] assists pt in making decisions and wants CSW to talk with her. She said she would be at bedside today as well.   CSW will follow up with pt's daughter to complete SNF workup.  TOC will continue to follow.          Expected Discharge Plan and Services                                               Social Determinants of Health (SDOH) Interventions SDOH Screenings   Food Insecurity: No Food Insecurity (06/17/2023)  Housing: Low Risk  (06/17/2023)  Transportation Needs: No Transportation Needs (06/17/2023)  Utilities: Not At Risk (06/17/2023)  Tobacco Use: Medium Risk (06/21/2023)    Readmission Risk Interventions     No data to display

## 2023-06-23 NOTE — Progress Notes (Signed)
OT Cancellation Note  Patient Details Name: Amy [name] MRN: [id number] DOB: [birth year]   Cancelled Treatment:    Reason Eval/Treat Not Completed: Patient declined, no reason specified (Pt reporting significant abd pain and worn out from room change. Will return as schedule allows.)  Tyler Deis, OTR/L [facility] Acute Rehabilitation Office: [phone]   Myrla Halsted 06/23/2023, 4:21 PM

## 2023-06-23 NOTE — TOC Initial Note (Signed)
Transition of Care [facility]) - Initial/Assessment Note    Patient Details  Name: Amy [name] MRN: [id number] Date of Birth: December 06, 1935  Transition [facility]) CM/SW Contact:    Amy [name], Amy [name] Phone Number: 06/23/2023, 1:45 PM  Clinical Narrative:                  CSW contacted and spoke with pt's daughter, Amy [name]    about recommendation for SNF. She said she wanted pt to go to SNF and that pt is agreeable since they have been having conversations about that possibility for some time.   She said she does not have a preference.   TOC will continue to follow.   Expected Discharge Plan: Skilled Nursing Facility Barriers to Discharge: Continued Medical Work up, SNF Pending bed offer, Insurance Authorization   Patient Goals and CMS Choice Patient states their goals for this hospitalization and ongoing recovery are:: get better          Expected Discharge Plan and Services In-house Referral: Clinical Social Work     Living arrangements for the past 2 months: Single Family Home                                      Prior Living Arrangements/Services Living arrangements for the past 2 months: Single Family Home Lives with:: Self, Adult Children                   Activities of Daily Living Home Assistive Devices/Equipment: Cane (specify quad or straight), Walker (specify type) ADL Screening (condition at time of admission) Patient's cognitive ability adequate to safely complete daily activities?: Yes Is the patient deaf or have difficulty hearing?: Yes Does the patient have difficulty seeing, even when wearing glasses/contacts?: No Does the patient have difficulty concentrating, remembering, or making decisions?: Yes Patient able to express need for assistance with ADLs?: Yes Does the patient have difficulty dressing or bathing?: No Independently performs ADLs?: Yes (appropriate for developmental age) Does the patient have difficulty walking  or climbing stairs?: Yes Weakness of Legs: Both Weakness of Arms/Hands: Left  Permission Sought/Granted                  Emotional Assessment Appearance:: Appears stated age Attitude/Demeanor/Rapport: Engaged Affect (typically observed): Appropriate Orientation: : Oriented to Self, Oriented to Place, Oriented to Situation Alcohol / Substance Use: Tobacco Use Psych Involvement: No (comment)  Admission diagnosis:  Choledocholithiasis [K80.50] Hyponatremia [E87.1] Calculus of gallbladder without cholecystitis without obstruction [K80.20] Acute cystitis with hematuria [N30.01] AKI (acute kidney injury) (HCC) [N17.9] Acute kidney injury (HCC) [N17.9] Abdominal pain, unspecified abdominal location [R10.9] Anemia, unspecified type [D64.9] Patient Active Problem List   Diagnosis Date Noted   Acute gastric ulcer 06/20/2023   Cholelithiasis with choledocholithiasis 06/18/2023   Loss of weight 06/18/2023   Hyperkalemia 06/17/2023   Hyponatremia 06/17/2023   Metabolic acidosis 06/17/2023   Gastritis 06/17/2023   Heme positive stool 06/17/2023   Acute kidney injury (HCC) 06/17/2023   Anemia of chronic disease 05/08/2017   Iron deficiency anemia due to chronic blood loss 04/11/2017   Ulcer, colon    AKI (acute kidney injury) (HCC) 02/13/2017   Anemia 02/13/2017   Abnormal ECG 02/13/2017   Essential hypertension 02/13/2017   Leukocytosis 02/13/2017   Elevated vitamin B12 level 02/13/2017   PCP:  Amy Paradise, MD Pharmacy:   [facility] [id number],  [place] - [address]  [place] [address] Phone: [phone] Fax: [phone]     Social Determinants of Health (SDOH) Social History: SDOH Screenings   Food Insecurity: No Food Insecurity (06/17/2023)  Housing: Low Risk  (06/17/2023)  Transportation Needs: No Transportation Needs (06/17/2023)  Utilities: Not At Risk (06/17/2023)  Tobacco Use: Medium Risk (06/21/2023)    SDOH Interventions:     Readmission Risk Interventions     No data to display

## 2023-06-23 NOTE — Progress Notes (Signed)
PROGRESS NOTE    Amy [name]  EAV:[id number] DOB: 10-29-1936 DOA: 06/16/2023 PCP: Geoffry Paradise, MD    Brief Narrative:   Amy [name] is a 87 y.o. female with past medical history significant for HTN, GERD, iron deficiency anemia, CKD stage IIIb/IV, PUD, who presented to [facility] ED on 8/9 by direction of her outpatient PCP for concerns of worsening kidney function.  Patient with elevated creatinine of 3.5; in which she had been on HCTZ which has been held.  Patient endorses nausea/vomiting, abdominal pain localized in the epigastric area.  Also associated with progressive weakness.  In the ED, temperature 97.8 F, HR 65, RR 18, BP 143/46, SpO2 100% on room air.  WBC 13.7, hemoglobin 10.9, platelets 222.  Sodium 124, potassium 5.5, chloride 97, CO2 16, glucose 123, BUN 59, creatinine 3.45.  AST 15, ALT 14, total bilirubin 0.6.  Urinalysis with large leukocytes, negative nitrite, few bacteria, greater than 50 WBCs.  FOBT positive.  CT abdomen/pelvis without contrast with persistent cholelithiasis and persistent choledocholithiasis with slightly increased size, bile duct dilation, new CT finding of acute cholecystitis, multilevel severe degenerative changes spine with impacted/fused L4-5 levels and associated grade 2 anterolisthesis L4 on L5, trace hiatal hernia.  Patient received 1 L NS bolus.  GI was consulted.  TRH consulted for admission for further evaluation and management.  Patient was transferred to [facility].  Assessment & Plan:   Acute renal failure on CKD stage IIIb/IV Nonanion gap metabolic acidosis: Resolved Patient staying with a creatinine of 3.45.  Review of EMR notable for baseline creatinine 1.9 2018. -- Cr 3.45>>2.86>2.74>2.36> labs pending this morning -- serum CO2 16>>24>27 -- Sodium bicarb drip at 100 mL/h -- Continue to hold home olmesartan -- BMP daily -- Would benefit from outpatient nephrology follow-up  Upper GI bleed secondary to peptic  ulcer Patient presenting with a hemoglobin of 10.9, baseline 13.  FOBT positive.  History of peptic ulcer disease.  Patient underwent ERCP on 06/19/2023 with findings of 8 mm ulcer prepyloric area without active bleeding.  Anemia panel with iron 62, TIBC 220, ferritin 28, folate 4.8, vitamin B12 459. -- Hgb 10.9>>8.6>9.3>8.9>8.7 --Folic acid 1 mg p.o. daily -- CBC daily  Choledocholithiasis Chronic cholelithiasis CT abdomen/pelvis with persistent cholelithiasis/choledocholithiasis with slightly increased size of bile duct.  GI was consulted and patient underwent ERCP 8/12 with findings significant for CBD dilated to 15 mm with multiple filling defects s/p sphincterotomy and sweeping of the biliary tree with extraction of stones, nonfilling of the cystic duct/gallbladder.  General surgery was consulted and patient underwent laparoscopic cholecystectomy with lysis of adhesions by Dr. Fredricka Bonine on 06/21/2023.  -- Drain output 55 mL past 24 hours, general surgery plans outpatient removal -- Regular diet  Folic acid deficiency Folic acid low at 4.8. -- Folic acid 1 mg p.o. daily  Essential hypertension -- Amlodipine 10 mg p.o. daily -- Atenolol 25 mg p.o. daily  Hyperkalemia: Resolved Hypokalemia Potassium 5.5 on admission, likely secondary to acute renal failure.  Improved with IV fluid hydration, now 3.2.   -- Will replete potassium today -- BMP in the a.m.  Hypomagnesemia Repleted, labs pending this morning -- Repeat magnesium level in a.m.  Hyponatremia: Resolved Etiology likely secondary to poor oral intake versus outpatient hydrochlorothiazide use. -- Na 124>>134>136>135> labs pending this morning --HCTZ discontinued -- Continue sodium bicarb drip as above -- BMP daily  Right foot pain Patient course intermittent pain to her right foot.  X-ray with no acute  findings. Resting right and left ankle-brachial index indicates noncompressible right lower extremity arteries with abnormal  toe brachial index. -- Recommend outpatient follow-up with vascular surgery.  Weakness/debility/deconditioning: Patient ambulates with the use of a walker/cane at baseline. -- PT evaluation: SNF -- OT: Recommending SNF -- TOC for placement   DVT prophylaxis: SCDs Start: 06/17/23 0647    Code Status: DNR Family Communication: No family present at bedside this morning  Disposition Plan:  Level of care: Med-Surg Status is: Inpatient Remains inpatient appropriate because: Pending SNF placement, medically stable for discharge once bed available    Consultants:  [facility]  Procedures:  ERCP 8/12 Laparoscopic cholecystectomy with LOA 8/14, Dr. Fredricka Bonine  Antimicrobials:  Ceftriaxone 8/9 - 8/9  fosfomycin 8/10 - 8/10  ciprofloxacin 8/12 - 8/12   Subjective: Patient seen examined bedside, resting comfortably.  Lying in bed.  Patient reports some mild nausea after eating her breakfast, also mild headache and received Tylenol.  Otherwise no other complaints or concerns at this time.  Discussed PT/OT recommendations of SNF placement, patient agreeable.  No other questions or concerns at this time.  Denies visual disturbance, no dizziness, no chest pain, no palpitations, no shortness of breath, no abdominal pain, no focal weakness, no fatigue, no paresthesias.  No acute events overnight per nursing staff.  Objective: Vitals:   06/22/23 2015 06/22/23 2334 06/23/23 0333 06/23/23 0810  BP: (!) 142/52 (!) 135/54 (!) 158/44 (!) 147/43  Pulse: 66 66 66 72  Resp: 16  17   Temp: 97.8 F (36.6 C) (!) 97.5 F (36.4 C) 97.8 F (36.6 C) 97.7 F (36.5 C)  TempSrc: Oral Oral Oral   SpO2: 94% 96% 96% 96%  Weight:   64 kg   Height:        Intake/Output Summary (Last 24 hours) at 06/23/2023 0947 Last data filed at 06/23/2023 4540 Gross per 24 hour  Intake 180 ml  Output 800 ml  Net -620 ml   Filed Weights   06/19/23 1210 06/21/23 1101 06/23/23 0333  Weight: 63.3 kg 64 kg 64 kg     Examination:  Physical Exam: GEN: NAD, alert and oriented x 3, elderly in appearance HEENT: NCAT, PERRL, EOMI, sclera clear, MMM PULM: CTAB w/o wheezes/crackles, normal respiratory effort, on room air CV: RRR w/o M/G/R GI: abd soft, NTND, NABS, no R/G/M, noted laparoscopic port sites to abdomen with Dermabond in place, no surrounding fluctuance/erythema, abdominal drain noted MSK: no peripheral edema, moves all extremities dependently NEURO: No focal neurological deficit PSYCH: normal mood/affect Integumentary: Surgical port sites as above, otherwise no other concerning rashes/lesions/wounds noted on exposed skin surfaces.     Data Reviewed: I have personally reviewed following labs and imaging studies  CBC: Recent Labs  Lab 06/16/23 1905 06/17/23 0702 06/18/23 0350 06/19/23 0149 06/21/23 0641 06/22/23 0157  WBC 13.7*  --  9.5 9.6 13.4* 10.0  NEUTROABS 10.6*  --   --   --   --   --   HGB 10.9* 9.6* 8.6* 9.3* 8.9* 8.7*  HCT 34.1* 29.4* 26.3* 28.8* 27.2* 26.1*  MCV 96.1  --  93.6 97.0 92.5 94.2  PLT 222  --  170 181 214 179   Basic Metabolic Panel: Recent Labs  Lab 06/18/23 0350 06/19/23 0149 06/20/23 0307 06/21/23 0641 06/22/23 0157  NA 130* 131* 134* 136 135  K 4.3 4.2 5.1 3.6 3.2*  CL 106 108 109 103 99  CO2 15* 16* 15* 24 27  GLUCOSE 88 89 132* 139*  120*  BUN 43* 37* 37* 31* 30*  CREATININE 3.05* 2.80* 2.86* 2.74* 2.36*  CALCIUM 7.7* 7.8* 8.0* 7.9* 7.3*  MG 1.4* 2.1  --   --  1.4*   GFR: Estimated Creatinine Clearance: 15.7 mL/min (A) (by C-G formula based on SCr of 2.36 mg/dL (H)). Liver Function Tests: Recent Labs  Lab 06/16/23 1905 06/20/23 0307 06/22/23 0157  AST 15 24 59*  ALT 14 19 38  ALKPHOS 65 54 39  BILITOT 0.6 0.4 0.4  PROT 7.4 5.2* 4.8*  ALBUMIN 3.5 2.2* 2.0*   Recent Labs  Lab 06/20/23 0307 06/22/23 0157  LIPASE 39 70*   No results for input(s): "AMMONIA" in the last 168 hours. Coagulation Profile: No results for input(s):  "INR", "PROTIME" in the last 168 hours. Cardiac Enzymes: No results for input(s): "CKTOTAL", "CKMB", "CKMBINDEX", "TROPONINI" in the last 168 hours. BNP (last 3 results) No results for input(s): "PROBNP" in the last 8760 hours. HbA1C: No results for input(s): "HGBA1C" in the last 72 hours. CBG: Recent Labs  Lab 06/18/23 0852  GLUCAP 96   Lipid Profile: No results for input(s): "CHOL", "HDL", "LDLCALC", "TRIG", "CHOLHDL", "LDLDIRECT" in the last 72 hours. Thyroid Function Tests: No results for input(s): "TSH", "T4TOTAL", "FREET4", "T3FREE", "THYROIDAB" in the last 72 hours. Anemia Panel: No results for input(s): "VITAMINB12", "FOLATE", "FERRITIN", "TIBC", "IRON", "RETICCTPCT" in the last 72 hours. Sepsis Labs: No results for input(s): "PROCALCITON", "LATICACIDVEN" in the last 168 hours.  Recent Results (from the past 240 hour(s))  Surgical pcr screen     Status: None   Collection Time: 06/20/23 11:05 PM   Specimen: Nasal Mucosa; Nasal Swab  Result Value Ref Range Status   MRSA, PCR NEGATIVE NEGATIVE Final   Staphylococcus aureus NEGATIVE NEGATIVE Final    Comment: (NOTE) The Xpert SA Assay (FDA approved for NASAL specimens in patients 66 years of age and older), is one component of a comprehensive surveillance program. It is not intended to diagnose infection nor to guide or monitor treatment. Performed at [facility], [address] [place], [place] [address]          Radiology Studies: No results found.      Scheduled Meds:  acetaminophen  650 mg Oral Q6H   Or   acetaminophen  650 mg Rectal Q6H   amLODipine  10 mg Oral Daily   atenolol  25 mg Oral Daily   docusate sodium  100 mg Oral BID   feeding supplement  237 mL Oral TID BM   folic acid  1 mg Oral Daily   pantoprazole  40 mg Oral BID   polyethylene glycol  17 g Oral Daily   Continuous Infusions:     LOS: 6 days    Time spent: 48 minutes spent on chart review, discussion with nursing  staff, consultants, updating family and interview/physical exam; more than 50% of that time was spent in counseling and/or coordination of care.    Alvira Philips [place], DO [facility] Available via Epic secure chat 7am-7pm After these hours, please refer to coverage provider listed on [email or web address] 06/23/2023, 9:47 AM

## 2023-06-23 NOTE — Progress Notes (Signed)
2 Days Post-Op   Subjective/Chief Complaint: Complains of headache which is getting better after getting some Tylenol.  Reports that she has been eating well, no bowel movement yet   Objective: Vital signs in last 24 hours: Temp:  [97.5 F (36.4 C)-97.8 F (36.6 C)] 97.7 F (36.5 C) (08/16 0810) Pulse Rate:  [63-72] 72 (08/16 0810) Resp:  [16-17] 17 (08/16 0333) BP: (135-158)/(38-54) 147/43 (08/16 0810) SpO2:  [94 %-97 %] 96 % (08/16 0810) Weight:  [64 kg] 64 kg (08/16 0333) Last BM Date : 06/19/23  Intake/Output from previous day: 08/15 0701 - 08/16 0700 In: -  Out: 855 [Urine:800; Drains:55] Intake/Output this shift: No intake/output data recorded.  Alert, well appearing Unlabored respirations Abd soft, nondistended, appropriately tender around incisions which are c/d/I with dermabond, no cellulitis or hematoma Drain output is serosanguinous  Lab Results:  Recent Labs    06/21/23 0641 06/22/23 0157  WBC 13.4* 10.0  HGB 8.9* 8.7*  HCT 27.2* 26.1*  PLT 214 179   BMET Recent Labs    06/21/23 0641 06/22/23 0157  NA 136 135  K 3.6 3.2*  CL 103 99  CO2 24 27  GLUCOSE 139* 120*  BUN 31* 30*  CREATININE 2.74* 2.36*  CALCIUM 7.9* 7.3*   PT/INR No results for input(s): "LABPROT", "INR" in the last 72 hours. ABG No results for input(s): "PHART", "HCO3" in the last 72 hours.  Invalid input(s): "PCO2", "PO2"  Studies/Results: No results found.  Anti-infectives: Anti-infectives (From admission, onward)    Start     Dose/Rate Route Frequency Ordered Stop   06/21/23 1059  ceFAZolin (ANCEF) 2-4 GM/100ML-% IVPB       Note to Pharmacy: Marijo Sanes B: cabinet override      06/21/23 1059 06/21/23 2314   06/21/23 0600  ceFAZolin (ANCEF) IVPB 2g/100 mL premix        2 g 200 mL/hr over 30 Minutes Intravenous On call to O.R. 06/20/23 1307 06/21/23 1027   06/19/23 1215  ampicillin-sulbactam (UNASYN) 1.5 g in sodium chloride 0.9 % 100 mL IVPB  Status:  Discontinued         1.5 g 200 mL/hr over 30 Minutes Intravenous  Once 06/19/23 1214 06/19/23 1547   06/17/23 1400  fosfomycin (MONUROL) packet 3 g        3 g Oral  Once 06/17/23 1300 06/17/23 1720   06/16/23 2315  cefTRIAXone (ROCEPHIN) 1 g in sodium chloride 0.9 % 100 mL IVPB        1 g 200 mL/hr over 30 Minutes Intravenous  Once 06/16/23 2313 06/17/23 0509       Assessment/Plan: Choledocholithiasis - s/p ERCP 8/12: Choledocholithiasis s/p biliary sphincterotomy, sphincteroplasty and stone extraction; nonfilling of the cystic duct/ gallbladder -cleared by cardiology, appreciate evaluation -s/p lap chole 8/14 with lysis of adhesions. Chronic cholecystitis and dense scarring of posterior liver/GB to retroperitoneum from prior open adrenalectomy.    ID - none currently FEN -regular diet, bowel regimen VTE - SCDs, per primary Foley - none   Current dispo- patient states that she was told she would be going to rehab.  Okay for discharge from surgery standpoint when cleared medically; we will plan to remove her drain about 10 days postop in the office as long as it remains serosanguineous.     Berna Bue, MD [facility] Surgery 06/23/2023 8:16 AM  Please see Amion for pager number during day hours 7:00am-4:30pm

## 2023-06-24 ENCOUNTER — Inpatient Hospital Stay (HOSPITAL_COMMUNITY): Payer: Medicare HMO

## 2023-06-24 DIAGNOSIS — N179 Acute kidney failure, unspecified: Secondary | ICD-10-CM | POA: Diagnosis not present

## 2023-06-24 LAB — GLUCOSE, CAPILLARY
Glucose-Capillary: 96 mg/dL (ref 70–99)
Glucose-Capillary: 92 mg/dL (ref 70–99)

## 2023-06-24 LAB — BASIC METABOLIC PANEL
Anion gap: 8 (ref 5–15)
BUN: 38 mg/dL — ABNORMAL HIGH (ref 8–23)
CO2: 26 mmol/L (ref 22–32)
Calcium: 8 mg/dL — ABNORMAL LOW (ref 8.9–10.3)
Chloride: 99 mmol/L (ref 98–111)
Creatinine, Ser: 2.47 mg/dL — ABNORMAL HIGH (ref 0.44–1.00)
GFR, Estimated: 19 mL/min — ABNORMAL LOW (ref >60)
Glucose, Bld: 105 mg/dL — ABNORMAL HIGH (ref 70–99)
Potassium: 4.7 mmol/L (ref 3.5–5.1)
Sodium: 133 mmol/L — ABNORMAL LOW (ref 135–145)

## 2023-06-24 LAB — MAGNESIUM: Magnesium: 2.5 mg/dL — ABNORMAL HIGH (ref 1.7–2.4)

## 2023-06-24 MED ORDER — HYDRALAZINE HCL 25 MG PO TABS
25.0000 mg | ORAL_TABLET | Freq: Three times a day (TID) | ORAL | Status: DC
  Administered 2023-06-24 – 2023-06-25 (×4): 25 mg via ORAL
  Filled 2023-06-24 (×4): qty 1

## 2023-06-24 MED ORDER — SODIUM CHLORIDE 0.9 % IV SOLN: INTRAVENOUS | Status: DC

## 2023-06-24 NOTE — NC FL2 (Signed)
[place] MEDICAID FL2 LEVEL OF CARE FORM     IDENTIFICATION  Patient Name: Amy [name] Birthdate: 30-Sep-1936 Sex: female Admission Date (Current Location): 06/16/2023  [facility] and [place] Number:  Producer, television/film/video and Address:  [facility] [place], [address] [place], [place] [address]      Provider Number: [id number]  Attending Physician Name and Address:  [place], Alvira Philips, DO  Relative Name and Phone Number:       Current Level of Care: Hospital Recommended Level of Care: Skilled Nursing Facility Prior Approval Number:    Date Approved/Denied:   PASRR Number: [id number]  Discharge Plan: SNF    Current Diagnoses: Patient Active Problem List   Diagnosis Date Noted   Acute gastric ulcer 06/20/2023   Cholelithiasis with choledocholithiasis 06/18/2023   Loss of weight 06/18/2023   Hyperkalemia 06/17/2023   Hyponatremia 06/17/2023   Metabolic acidosis 06/17/2023   Gastritis 06/17/2023   Heme positive stool 06/17/2023   Acute kidney injury (HCC) 06/17/2023   Anemia of chronic disease 05/08/2017   Iron deficiency anemia due to chronic blood loss 04/11/2017   Ulcer, colon    AKI (acute kidney injury) (HCC) 02/13/2017   Anemia 02/13/2017   Abnormal ECG 02/13/2017   Essential hypertension 02/13/2017   Leukocytosis 02/13/2017   Elevated vitamin B12 level 02/13/2017    Orientation RESPIRATION BLADDER Height & Weight     Self, Time, Situation, Place  Normal Continent Weight: 141 lb 1.5 oz (64 kg) Height:  5\' 5"  (165.1 cm)  BEHAVIORAL SYMPTOMS/MOOD NEUROLOGICAL BOWEL NUTRITION STATUS      Continent    AMBULATORY STATUS COMMUNICATION OF NEEDS Skin   Extensive Assist   Normal                       Personal Care Assistance Level of Assistance  Bathing, Feeding, Dressing Bathing Assistance: Limited assistance Feeding assistance: Limited assistance Dressing Assistance: Maximum assistance     Functional Limitations Info  Sight,  Hearing, Speech Sight Info: Adequate Hearing Info: Adequate Speech Info: Adequate    SPECIAL CARE FACTORS FREQUENCY  PT (By licensed PT), OT (By licensed OT)                    Contractures Contractures Info: Not present    Additional Factors Info  Code Status Code Status Info: DNR             Current Medications (06/24/2023):  This is the current hospital active medication list Current Facility-Administered Medications  Medication Dose Route Frequency Provider Last Rate Last Admin   acetaminophen (TYLENOL) tablet 650 mg  650 mg Oral Q6H Meuth, Brooke A, PA-C   650 mg at 06/24/23 5621   Or   acetaminophen (TYLENOL) suppository 650 mg  650 mg Rectal Q6H Meuth, Brooke A, PA-C       amLODipine (NORVASC) tablet 10 mg  10 mg Oral Daily [place], Alvira Philips, DO   10 mg at 06/23/23 0903   atenolol (TENORMIN) tablet 25 mg  25 mg Oral Daily Meuth, Brooke A, PA-C   25 mg at 06/23/23 3086   bisacodyl (DULCOLAX) suppository 10 mg  10 mg Rectal Daily PRN Berna Bue, MD       cyclobenzaprine (FLEXERIL) tablet 10 mg  10 mg Oral Daily PRN Meuth, Brooke A, PA-C   10 mg at 06/17/23 2157   docusate sodium (COLACE) capsule 100 mg  100 mg Oral BID Fredricka Bonine,  Chelsea A, MD   100 mg at 06/23/23 2057   feeding supplement (ENSURE ENLIVE / ENSURE PLUS) liquid 237 mL  237 mL Oral TID BM Phylliss Blakes A, MD   237 mL at 06/23/23 1305   folic acid (FOLVITE) tablet 1 mg  1 mg Oral Daily Meuth, Brooke A, PA-C   1 mg at 06/23/23 0856   hydrALAZINE (APRESOLINE) injection 10 mg  10 mg Intravenous Q8H PRN Sundil, Subrina, MD   10 mg at 06/22/23 0034   hydrALAZINE (APRESOLINE) tablet 25 mg  25 mg Oral Q8H [place], Eric J, DO       morphine (PF) 2 MG/ML injection 2 mg  2 mg Intravenous Q3H PRN Meuth, Brooke A, PA-C       ondansetron (ZOFRAN) injection 4 mg  4 mg Intravenous Q6H PRN Phylliss Blakes A, MD   4 mg at 06/23/23 1644   pantoprazole (PROTONIX) EC tablet 40 mg  40 mg Oral BID Meuth, Brooke A, PA-C   40  mg at 06/23/23 2057   polyethylene glycol (MIRALAX / GLYCOLAX) packet 17 g  17 g Oral Daily Phylliss Blakes A, MD   17 g at 06/23/23 0856   traMADol (ULTRAM) tablet 50 mg  50 mg Oral Q4H PRN Meuth, Brooke A, PA-C   50 mg at 06/23/23 2057     Discharge Medications: Please see discharge summary for a list of discharge medications.  Relevant Imaging Results:  Relevant Lab Results:   Additional Information SS# [id number]  Deatra Robinson, [place]

## 2023-06-24 NOTE — Progress Notes (Signed)
Occupational Therapy Treatment Patient Details Name: Amy [name] MRN: [id number] DOB: Jun 05, 1936 Today's Date: 06/24/2023   History of present illness Patient is and 87 y.o. female presented to [facility] on 8/9 for worsening kidney function and admitted for acute renal failure on CKD and with FOBT. Pt underwent ECRP 8/12 with findings of choledocholithiasis now s/p lap chole with lysis of adhesions on 8/14. PMH HTN GERD CKD III.   OT comments  Pt. Seen for skilled OT treatment session.  Initial hesitation to participate due to stated weakness and needing to "take it slow".  Reviewed benefits of participation paired with rest is optimal.  Pt. Verbalized understanding and agreed to bed level session.  Max encouragement and cues for all tasks.  Pt. Able to complete desired tasks and vocalized being pleased with what she was able to achieve today.  Able to complete bed mobility with continued encouragement and cues for sequencing.  Seated grooming task and lb dressing eob.  Pt. Tolerated well.  Assisted back to bed at end of session.  Cont. With acute OT poc.  Agree with d/c recommendations.        If plan is discharge home, recommend the following:  A lot of help with walking and/or transfers;A lot of help with bathing/dressing/bathroom;Assist for transportation   Equipment Recommendations  None recommended by OT    Recommendations for Other Services      Precautions / Restrictions Precautions Precautions: Fall Precaution Comments: JP drain       Mobility Bed Mobility Overal bed mobility: Needs Assistance Bed Mobility: Supine to Sit     Supine to sit: Contact guard, HOB elevated     General bed mobility comments: cues for walking LE's off EOB and to pivot trunk with use of bed rail. cues for scooting to eob and stabalizing balance with B feet on the floor.  able to achieve without physical assistance    Transfers                         Balance                                            ADL either performed or assessed with clinical judgement   ADL Overall ADL's : Needs assistance/impaired     Grooming: Set up;Sitting;Brushing hair Grooming Details (indicate cue type and reason): max encouragement and support for task initiation and completion             Lower Body Dressing: Contact guard assist;Sitting/lateral leans Lower Body Dressing Details (indicate cue type and reason): able to cross legs over to reach B feet with demo and encouragement                    Extremity/Trunk Assessment              Vision       Perception     Praxis      Cognition Arousal: Alert Behavior During Therapy: [facility] for tasks assessed/performed Overall Cognitive Status: No family/caregiver present to determine baseline cognitive functioning                                          Exercises      Shoulder  Instructions       General Comments      Pertinent Vitals/ Pain       Pain Assessment Pain Assessment: Faces Faces Pain Scale: Hurts little more Pain Location: R stomach area Pain Descriptors / Indicators: Grimacing Pain Intervention(s): Limited activity within patient's tolerance, Repositioned  Home Living                                          Prior Functioning/Environment              Frequency  Min 1X/week        Progress Toward Goals  OT Goals(current goals can now be found in the care plan section)  Progress towards OT goals: Progressing toward goals     Plan      Co-evaluation                 AM-PAC OT "6 Clicks" Daily Activity     Outcome Measure   Help from another person eating meals?: A Little Help from another person taking care of personal grooming?: A Little Help from another person toileting, which includes using toliet, bedpan, or urinal?: A Lot Help from another person bathing (including washing, rinsing, drying)?: A Lot Help from  another person to put on and taking off regular upper body clothing?: A Little Help from another person to put on and taking off regular lower body clothing?: A Lot 6 Click Score: 15    End of Session    OT Visit Diagnosis: Unsteadiness on feet (R26.81);Muscle weakness (generalized) (M62.81)   Activity Tolerance Patient tolerated treatment well   Patient Left in bed;with call bell/phone within reach;with bed alarm set   Nurse Communication Other (comment) (rn states ok to work with pt.)        Time: 1610-9604 OT Time Calculation (min): 16 min  Charges: OT General Charges $OT Visit: 1 Visit OT Treatments $Self Care/Home Management : 8-22 mins  Boneta Lucks, COTA/L Acute Rehabilitation [phone]   Alessandra Bevels Lorraine-COTA/L 06/24/2023, 10:41 AM

## 2023-06-24 NOTE — TOC Progression Note (Signed)
Per weekday SW note, pt and family agreeable to SNF. Will begin SNF search and f/u with offers as available. Pt will require auth.     Dellie Burns, MSW, LCSW [phone] (coverage)

## 2023-06-24 NOTE — Progress Notes (Signed)
PROGRESS NOTE    Amy [name]  JJO:[id number] DOB: 03-May-1936 DOA: 06/16/2023 PCP: Geoffry Paradise, MD    Brief Narrative:   Amy [name] is a 87 y.o. female with past medical history significant for HTN, GERD, iron deficiency anemia, CKD stage IIIb/IV, PUD, who presented to [facility] ED on 8/9 by direction of her outpatient PCP for concerns of worsening kidney function.  Patient with elevated creatinine of 3.5; in which she had been on HCTZ which has been held.  Patient endorses nausea/vomiting, abdominal pain localized in the epigastric area.  Also associated with progressive weakness.  In the ED, temperature 97.8 F, HR 65, RR 18, BP 143/46, SpO2 100% on room air.  WBC 13.7, hemoglobin 10.9, platelets 222.  Sodium 124, potassium 5.5, chloride 97, CO2 16, glucose 123, BUN 59, creatinine 3.45.  AST 15, ALT 14, total bilirubin 0.6.  Urinalysis with large leukocytes, negative nitrite, few bacteria, greater than 50 WBCs.  FOBT positive.  CT abdomen/pelvis without contrast with persistent cholelithiasis and persistent choledocholithiasis with slightly increased size, bile duct dilation, new CT finding of acute cholecystitis, multilevel severe degenerative changes spine with impacted/fused L4-5 levels and associated grade 2 anterolisthesis L4 on L5, trace hiatal hernia.  Patient received 1 L NS bolus.  GI was consulted.  TRH consulted for admission for further evaluation and management.  Patient was [facility].  Assessment & Plan:   Acute renal failure on CKD stage IIIb/IV Nonanion gap metabolic acidosis: Resolved Patient staying with a creatinine of 3.45.  Review of EMR notable for baseline creatinine 1.9 2018. -- Cr 3.45>>2.86>2.74>2.36>2.54>2.47 -- serum CO2 16>>24>27 -- NS at 75 mL/h -- Continue to hold home olmesartan -- BMP daily -- Would benefit from outpatient nephrology follow-up  Upper GI bleed secondary to peptic ulcer Patient presenting with a  hemoglobin of 10.9, baseline 13.  FOBT positive.  History of peptic ulcer disease.  Patient underwent ERCP on 06/19/2023 with findings of 8 mm ulcer prepyloric area without active bleeding.  Anemia panel with iron 62, TIBC 220, ferritin 28, folate 4.8, vitamin B12 459. -- Hgb 10.9>>8.6>9.3>8.9>8.7 --Folic acid 1 mg p.o. daily -- CBC daily  Choledocholithiasis Chronic cholelithiasis CT abdomen/pelvis with persistent cholelithiasis/choledocholithiasis with slightly increased size of bile duct.  GI was consulted and patient underwent ERCP 8/12 with findings significant for CBD dilated to 15 mm with multiple filling defects s/p sphincterotomy and sweeping of the biliary tree with extraction of stones, nonfilling of the cystic duct/gallbladder.  General surgery was consulted and patient underwent laparoscopic cholecystectomy with lysis of adhesions by Dr. Fredricka Bonine on 06/21/2023.  -- Drain output not recorded past 24 hours -- Regular diet -- Check abdominal x-ray given nausea and persistent abdominal pain  Folic acid deficiency Folic acid low at 4.8. -- Folic acid 1 mg p.o. daily  Essential hypertension -- Amlodipine 10 mg p.o. daily -- Atenolol 25 mg p.o. daily -- Started hydralazine 25 mg p.o. every 8 hours  Hyperkalemia: Resolved Hypokalemia Potassium 5.5 on admission, likely secondary to acute renal failure.  Improved with IV fluid hydration, now 4.7 today. -- Will replete potassium today -- BMP in the a.m.  Hypomagnesemia Repleted, magnesium 2.5 this morning.  Hyponatremia: Resolved Etiology likely secondary to poor oral intake versus outpatient hydrochlorothiazide use. -- Na 124>>134>136>135>133 labs pending this morning -- HCTZ discontinued -- NS at 75 mL/h -- BMP daily  Right foot pain Patient course intermittent pain to her right foot.  X-ray with no acute  findings. Resting right and left ankle-brachial index indicates noncompressible right lower extremity arteries with abnormal  toe brachial index. -- Recommend outpatient follow-up with vascular surgery.  Weakness/debility/deconditioning: Patient ambulates with the use of a walker/cane at baseline. -- PT/OT evaluation: SNF -- TOC for placement   DVT prophylaxis: SCDs Start: 06/17/23 0647    Code Status: DNR Family Communication: No family present at bedside this morning  Disposition Plan:  Level of care: Med-Surg Status is: Inpatient Remains inpatient appropriate because: Pending SNF placement, medically stable for discharge once bed available    Consultants:  [facility]  Procedures:  ERCP 8/12 Laparoscopic cholecystectomy with LOA 8/14, Dr. Fredricka Bonine  Antimicrobials:  Ceftriaxone 8/9 - 8/9  fosfomycin 8/10 - 8/10  ciprofloxacin 8/12 - 8/12   Subjective: Patient seen examined bedside, resting comfortably.  Lying in bed.  Continues to complain of nausea, abdominal pain while eating.  Pain improved with Tylenol.  Will check abdominal x-ray.  Seen by general surgery this morning.  Otherwise no other complaints or concerns at this time.   Denies visual disturbance, no dizziness, no chest pain, no palpitations, no shortness of breath, no abdominal pain, no focal weakness, no fatigue, no paresthesias.  No acute events overnight per nursing staff.  Objective: Vitals:   06/23/23 2108 06/24/23 0043 06/24/23 0432 06/24/23 0724  BP: (!) 182/53 (!) 177/60 (!) 186/59 (!) 169/62  Pulse: 73 74 74 72  Resp:    17  Temp: (!) 97.4 F (36.3 C)  97.6 F (36.4 C) 98.1 F (36.7 C)  TempSrc: Oral  Oral Oral  SpO2: 94% 92% 94% 95%  Weight:      Height:        Intake/Output Summary (Last 24 hours) at 06/24/2023 4098 Last data filed at 06/24/2023 0725 Gross per 24 hour  Intake 240 ml  Output 500 ml  Net -260 ml   Filed Weights   06/19/23 1210 06/21/23 1101 06/23/23 0333  Weight: 63.3 kg 64 kg 64 kg    Examination:  Physical Exam: GEN: NAD, alert and oriented x 3, elderly in appearance HEENT: NCAT, PERRL,  EOMI, sclera clear, MMM PULM: CTAB w/o wheezes/crackles, normal respiratory effort, on room air CV: RRR w/o M/G/R GI: abd soft, NTND, diminished bowel sounds, no R/G/M, noted laparoscopic port sites to abdomen with Dermabond in place, no surrounding fluctuance/erythema, abdominal drain noted MSK: no peripheral edema, moves all extremities dependently NEURO: No focal neurological deficit PSYCH: normal mood/affect Integumentary: Surgical port sites as above, otherwise no other concerning rashes/lesions/wounds noted on exposed skin surfaces.     Data Reviewed: I have personally reviewed following labs and imaging studies  CBC: Recent Labs  Lab 06/18/23 0350 06/19/23 0149 06/21/23 0641 06/22/23 0157  WBC 9.5 9.6 13.4* 10.0  HGB 8.6* 9.3* 8.9* 8.7*  HCT 26.3* 28.8* 27.2* 26.1*  MCV 93.6 97.0 92.5 94.2  PLT 170 181 214 179   Basic Metabolic Panel: Recent Labs  Lab 06/18/23 0350 06/19/23 0149 06/20/23 0307 06/21/23 0641 06/22/23 0157 06/23/23 0934 06/24/23 0604  NA 130* 131* 134* 136 135 132* 133*  K 4.3 4.2 5.1 3.6 3.2* 4.0 4.7  CL 106 108 109 103 99 97* 99  CO2 15* 16* 15* 24 27 24 26   GLUCOSE 88 89 132* 139* 120* 151* 105*  BUN 43* 37* 37* 31* 30* 35* 38*  CREATININE 3.05* 2.80* 2.86* 2.74* 2.36* 2.54* 2.47*  CALCIUM 7.7* 7.8* 8.0* 7.9* 7.3* 7.4* 8.0*  MG 1.4* 2.1  --   --  1.4* 2.6* 2.5*   GFR: Estimated Creatinine Clearance: 15 mL/min (A) (by C-G formula based on SCr of 2.47 mg/dL (H)). Liver Function Tests: Recent Labs  Lab 06/20/23 0307 06/22/23 0157  AST 24 59*  ALT 19 38  ALKPHOS 54 39  BILITOT 0.4 0.4  PROT 5.2* 4.8*  ALBUMIN 2.2* 2.0*   Recent Labs  Lab 06/20/23 0307 06/22/23 0157  LIPASE 39 70*   No results for input(s): "AMMONIA" in the last 168 hours. Coagulation Profile: No results for input(s): "INR", "PROTIME" in the last 168 hours. Cardiac Enzymes: No results for input(s): "CKTOTAL", "CKMB", "CKMBINDEX", "TROPONINI" in the last 168  hours. BNP (last 3 results) No results for input(s): "PROBNP" in the last 8760 hours. HbA1C: No results for input(s): "HGBA1C" in the last 72 hours. CBG: Recent Labs  Lab 06/18/23 0852 06/24/23 0721  GLUCAP 96 96   Lipid Profile: No results for input(s): "CHOL", "HDL", "LDLCALC", "TRIG", "CHOLHDL", "LDLDIRECT" in the last 72 hours. Thyroid Function Tests: No results for input(s): "TSH", "T4TOTAL", "FREET4", "T3FREE", "THYROIDAB" in the last 72 hours. Anemia Panel: No results for input(s): "VITAMINB12", "FOLATE", "FERRITIN", "TIBC", "IRON", "RETICCTPCT" in the last 72 hours. Sepsis Labs: No results for input(s): "PROCALCITON", "LATICACIDVEN" in the last 168 hours.  Recent Results (from the past 240 hour(s))  Surgical pcr screen     Status: None   Collection Time: 06/20/23 11:05 PM   Specimen: Nasal Mucosa; Nasal Swab  Result Value Ref Range Status   MRSA, PCR NEGATIVE NEGATIVE Final   Staphylococcus aureus NEGATIVE NEGATIVE Final    Comment: (NOTE) The Xpert SA Assay (FDA approved for NASAL specimens in patients 70 years of age and older), is one component of a comprehensive surveillance program. It is not intended to diagnose infection nor to guide or monitor treatment. Performed at [facility], [address], [place], [place] [address]          Radiology Studies: No results found.      Scheduled Meds:  acetaminophen  650 mg Oral Q6H   Or   acetaminophen  650 mg Rectal Q6H   amLODipine  10 mg Oral Daily   atenolol  25 mg Oral Daily   docusate sodium  100 mg Oral BID   feeding supplement  237 mL Oral TID BM   folic acid  1 mg Oral Daily   hydrALAZINE  25 mg Oral Q8H   pantoprazole  40 mg Oral BID   polyethylene glycol  17 g Oral Daily   Continuous Infusions:     LOS: 7 days    Time spent: 48 minutes spent on chart review, discussion with nursing staff, consultants, updating family and interview/physical exam; more than 50% of that time was  spent in counseling and/or coordination of care.    Alvira Philips [place], DO [facility] Available via Epic secure chat 7am-7pm After these hours, please refer to coverage provider listed on [email or web address] 06/24/2023, 9:04 AM

## 2023-06-24 NOTE — Progress Notes (Addendum)
3 Days Post-Op   Subjective/Chief Complaint: Some nausea with oatmeal this morning.  Isn't eating well yet.  No vomiting.  Passing flatus.  No bowel movement   Objective: Vital signs in last 24 hours: Temp:  [97.4 F (36.3 C)-98.1 F (36.7 C)] 98.1 F (36.7 C) (08/17 0724) Pulse Rate:  [70-74] 72 (08/17 0724) Resp:  [17] 17 (08/17 0724) BP: (169-186)/(51-62) 169/62 (08/17 0724) SpO2:  [92 %-97 %] 95 % (08/17 0724) Last BM Date : 06/19/23  Intake/Output from previous day: 08/16 0701 - 08/17 0700 In: 180 [P.O.:180] Out: -  Intake/Output this shift: Total I/O In: 240 [P.O.:240] Out: 500 [Urine:500]  Alert, well appearing Unlabored respirations Abd soft, nondistended, appropriately tender around incisions which are c/d/I with dermabond, no cellulitis or hematoma Drain output is serosanguinous  Lab Results:  Recent Labs    06/22/23 0157  WBC 10.0  HGB 8.7*  HCT 26.1*  PLT 179   BMET Recent Labs    06/23/23 0934 06/24/23 0604  NA 132* 133*  K 4.0 4.7  CL 97* 99  CO2 24 26  GLUCOSE 151* 105*  BUN 35* 38*  CREATININE 2.54* 2.47*  CALCIUM 7.4* 8.0*   PT/INR No results for input(s): "LABPROT", "INR" in the last 72 hours. ABG No results for input(s): "PHART", "HCO3" in the last 72 hours.  Invalid input(s): "PCO2", "PO2"  Studies/Results: No results found.  Anti-infectives: Anti-infectives (From admission, onward)    Start     Dose/Rate Route Frequency Ordered Stop   06/21/23 1059  ceFAZolin (ANCEF) 2-4 GM/100ML-% IVPB       Note to Pharmacy: Marijo Sanes B: cabinet override      06/21/23 1059 06/21/23 2314   06/21/23 0600  ceFAZolin (ANCEF) IVPB 2g/100 mL premix        2 g 200 mL/hr over 30 Minutes Intravenous On call to O.R. 06/20/23 1307 06/21/23 1027   06/19/23 1215  ampicillin-sulbactam (UNASYN) 1.5 g in sodium chloride 0.9 % 100 mL IVPB  Status:  Discontinued        1.5 g 200 mL/hr over 30 Minutes Intravenous  Once 06/19/23 1214 06/19/23 1547    06/17/23 1400  fosfomycin (MONUROL) packet 3 g        3 g Oral  Once 06/17/23 1300 06/17/23 1720   06/16/23 2315  cefTRIAXone (ROCEPHIN) 1 g in sodium chloride 0.9 % 100 mL IVPB        1 g 200 mL/hr over 30 Minutes Intravenous  Once 06/16/23 2313 06/17/23 0509       Assessment/Plan: Choledocholithiasis - s/p ERCP 8/12: Choledocholithiasis s/p biliary sphincterotomy, sphincteroplasty and stone extraction; nonfilling of the cystic duct/ gallbladder -cleared by cardiology, appreciate evaluation -s/p lap chole 8/14 with lysis of adhesions. Chronic cholecystitis and dense scarring of posterior liver/GB to retroperitoneum from prior open adrenalectomy.    ID - none currently FEN -regular diet, bowel regimen VTE - SCDs, per primary Foley - none   Current dispo- patient states that she was told she would be going to rehab.  Okay for discharge from surgery standpoint when cleared medically; we will plan to remove her drain about 10 days postop in the office as long as it remains serosanguineous.     Quentin Ore, MD  [facility] 06/24/2023 8:22 AM  Please see Amion for pager number during day hours 7:00am-4:30pm

## 2023-06-25 DIAGNOSIS — N179 Acute kidney failure, unspecified: Secondary | ICD-10-CM | POA: Diagnosis not present

## 2023-06-25 LAB — CBC
HCT: 27.8 % — ABNORMAL LOW (ref 36.0–46.0)
Hemoglobin: 8.7 g/dL — ABNORMAL LOW (ref 12.0–15.0)
MCH: 31 pg (ref 26.0–34.0)
MCHC: 31.3 g/dL (ref 30.0–36.0)
MCV: 98.9 fL (ref 80.0–100.0)
Platelets: 167 10*3/uL (ref 150–400)
RBC: 2.81 MIL/uL — ABNORMAL LOW (ref 3.87–5.11)
RDW: 14.9 % (ref 11.5–15.5)
WBC: 12.7 10*3/uL — ABNORMAL HIGH (ref 4.0–10.5)
nRBC: 0 % (ref 0.0–0.2)

## 2023-06-25 LAB — BASIC METABOLIC PANEL
Anion gap: 9 (ref 5–15)
BUN: 36 mg/dL — ABNORMAL HIGH (ref 8–23)
CO2: 20 mmol/L — ABNORMAL LOW (ref 22–32)
Calcium: 7 mg/dL — ABNORMAL LOW (ref 8.9–10.3)
Chloride: 105 mmol/L (ref 98–111)
Creatinine, Ser: 2.17 mg/dL — ABNORMAL HIGH (ref 0.44–1.00)
GFR, Estimated: 22 mL/min — ABNORMAL LOW (ref >60)
Glucose, Bld: 83 mg/dL (ref 70–99)
Potassium: 4.4 mmol/L (ref 3.5–5.1)
Sodium: 134 mmol/L — ABNORMAL LOW (ref 135–145)

## 2023-06-25 LAB — GLUCOSE, CAPILLARY
Glucose-Capillary: 79 mg/dL (ref 70–99)
Glucose-Capillary: 116 mg/dL — ABNORMAL HIGH (ref 70–99)

## 2023-06-25 MED ORDER — HYDRALAZINE HCL 50 MG PO TABS
50.0000 mg | ORAL_TABLET | Freq: Three times a day (TID) | ORAL | Status: DC
  Administered 2023-06-25 – 2023-06-26 (×3): 50 mg via ORAL
  Filled 2023-06-25 (×3): qty 1

## 2023-06-25 NOTE — Progress Notes (Signed)
PROGRESS NOTE    JONTIA PRINS  NWG:[id number] DOB: October 10, 1936 DOA: 06/16/2023 PCP: Geoffry Paradise, MD    Brief Narrative:   Amy [name] is a 87 y.o. female with past medical history significant for HTN, GERD, iron deficiency anemia, CKD stage IIIb/IV, PUD, who presented to [facility] ED on 8/9 by direction of her outpatient PCP for concerns of worsening kidney function.  Patient with elevated creatinine of 3.5; in which she had been on HCTZ which has been held.  Patient endorses nausea/vomiting, abdominal pain localized in the epigastric area.  Also associated with progressive weakness.  In the ED, temperature 97.8 F, HR 65, RR 18, BP 143/46, SpO2 100% on room air.  WBC 13.7, hemoglobin 10.9, platelets 222.  Sodium 124, potassium 5.5, chloride 97, CO2 16, glucose 123, BUN 59, creatinine 3.45.  AST 15, ALT 14, total bilirubin 0.6.  Urinalysis with large leukocytes, negative nitrite, few bacteria, greater than 50 WBCs.  FOBT positive.  CT abdomen/pelvis without contrast with persistent cholelithiasis and persistent choledocholithiasis with slightly increased size, bile duct dilation, new CT finding of acute cholecystitis, multilevel severe degenerative changes spine with impacted/fused L4-5 levels and associated grade 2 anterolisthesis L4 on L5, trace hiatal hernia.  Patient received 1 L NS bolus.  GI was consulted.  TRH consulted for admission for further evaluation and management.  Patient was transferred to [facility].  Assessment & Plan:   Acute renal failure on CKD stage IIIb/IV Nonanion gap metabolic acidosis: Resolved Patient staying with a creatinine of 3.45.  Review of EMR notable for baseline creatinine 1.9 2018. -- Cr 3.45>>2.86>2.74>2.36>2.54>2.47>2.17 -- serum CO2 16>>24>27 -- NS at 75 mL/h -- Continue to hold home olmesartan, likely dc on discharge given low GFR -- BMP daily -- Would benefit from outpatient nephrology follow-up  Upper GI bleed secondary  to peptic ulcer Patient presenting with a hemoglobin of 10.9, baseline 13.  FOBT positive.  History of peptic ulcer disease.  Patient underwent ERCP on 06/19/2023 with findings of 8 mm ulcer prepyloric area without active bleeding.  Anemia panel with iron 62, TIBC 220, ferritin 28, folate 4.8, vitamin B12 459. -- Hgb 10.9>>8.6>9.3>8.9>8.7 --Folic acid 1 mg p.o. daily -- CBC daily  Choledocholithiasis Chronic cholelithiasis CT abdomen/pelvis with persistent cholelithiasis/choledocholithiasis with slightly increased size of bile duct.  GI was consulted and patient underwent ERCP 8/12 with findings significant for CBD dilated to 15 mm with multiple filling defects s/p sphincterotomy and sweeping of the biliary tree with extraction of stones, nonfilling of the cystic duct/gallbladder.  General surgery was consulted and patient underwent laparoscopic cholecystectomy with lysis of adhesions by Dr. Fredricka Bonine on 06/21/2023.  -- Drain output not recorded past 24 hours -- Regular diet  Folic acid deficiency Folic acid low at 4.8. -- Folic acid 1 mg p.o. daily  Essential hypertension -- Amlodipine 10 mg p.o. daily -- Atenolol 25 mg p.o. daily -- Increase hydralazine to 50 mg p.o. every 8 hours -- Holding home olmesartan due to AKI as above, likely will discontinue given her CKD status with GFR less than 30  Hyperkalemia: Resolved Hypokalemia Potassium 5.5 on admission, likely secondary to acute renal failure.  Improved with IV fluid hydration, now 4.4 today. -- Will replete potassium today -- BMP in the a.m.  Hypomagnesemia: Resolved Repleted  Hyponatremia: Resolved Etiology likely secondary to poor oral intake. -- Na 124>>134>136>135>133>134 -- HCTZ discontinued -- NS at 75 mL/h -- BMP daily  Right foot pain Patient course intermittent pain to  her right foot.  X-ray with no acute findings. Resting right and left ankle-brachial index indicates noncompressible right lower extremity arteries  with abnormal toe brachial index. -- Recommend outpatient follow-up with vascular surgery.  Weakness/debility/deconditioning: Patient ambulates with the use of a walker/cane at baseline. -- PT/OT evaluation: SNF -- TOC for placement   DVT prophylaxis: SCDs Start: 06/17/23 0647    Code Status: DNR Family Communication: No family present at bedside this morning  Disposition Plan:  Level of care: Med-Surg Status is: Inpatient Remains inpatient appropriate because: Pending SNF placement, medically stable for discharge once bed available    Consultants:  [facility]  Procedures:  ERCP 8/12 Laparoscopic cholecystectomy with LOA 8/14, Dr. Fredricka Bonine  Antimicrobials:  Ceftriaxone 8/9 - 8/9  fosfomycin 8/10 - 8/10  ciprofloxacin 8/12 - 8/12   Subjective: Patient seen examined bedside, resting comfortably.  Lying in bed.  No further nausea or pain after eating breakfast this morning.  No complaints or concerns at this time.   Denies visual disturbance, no dizziness, no chest pain, no palpitations, no shortness of breath, no abdominal pain, no focal weakness, no fatigue, no paresthesias.  No acute events overnight per nursing staff.  Pending discharge to SNF, medically stable for discharge once bed available.  Objective: Vitals:   06/24/23 1946 06/25/23 0037 06/25/23 0447 06/25/23 0808  BP: (!) 168/74 (!) 180/112 (!) 161/76 (!) 158/59  Pulse: 69 72 75 73  Resp: 16 16 16 17   Temp: 98.1 F (36.7 C) 98.2 F (36.8 C) 98.2 F (36.8 C) 97.9 F (36.6 C)  TempSrc: Oral Oral Oral Oral  SpO2: 95% 93% 96% 95%  Weight:      Height:        Intake/Output Summary (Last 24 hours) at 06/25/2023 0930 Last data filed at 06/25/2023 9528 Gross per 24 hour  Intake 1531.24 ml  Output 600 ml  Net 931.24 ml   Filed Weights   06/19/23 1210 06/21/23 1101 06/23/23 0333  Weight: 63.3 kg 64 kg 64 kg    Examination:  Physical Exam: GEN: NAD, alert and oriented x 3, elderly in appearance HEENT:  NCAT, PERRL, EOMI, sclera clear, MMM PULM: CTAB w/o wheezes/crackles, normal respiratory effort, on room air CV: RRR w/o M/G/R GI: abd soft, NTND, + bowel sounds, no R/G/M, noted laparoscopic port sites to abdomen with Dermabond in place, no surrounding fluctuance/erythema, abdominal drain noted MSK: no peripheral edema, moves all extremities dependently NEURO: No focal neurological deficit PSYCH: normal mood/affect Integumentary: Surgical port sites as above, otherwise no other concerning rashes/lesions/wounds noted on exposed skin surfaces.     Data Reviewed: I have personally reviewed following labs and imaging studies  CBC: Recent Labs  Lab 06/19/23 0149 06/21/23 0641 06/22/23 0157 06/25/23 0629  WBC 9.6 13.4* 10.0 12.7*  HGB 9.3* 8.9* 8.7* 8.7*  HCT 28.8* 27.2* 26.1* 27.8*  MCV 97.0 92.5 94.2 98.9  PLT 181 214 179 167   Basic Metabolic Panel: Recent Labs  Lab 06/19/23 0149 06/20/23 0307 06/21/23 0641 06/22/23 0157 06/23/23 0934 06/24/23 0604 06/25/23 0629  NA 131*   < > 136 135 132* 133* 134*  K 4.2   < > 3.6 3.2* 4.0 4.7 4.4  CL 108   < > 103 99 97* 99 105  CO2 16*   < > 24 27 24 26  20*  GLUCOSE 89   < > 139* 120* 151* 105* 83  BUN 37*   < > 31* 30* 35* 38* 36*  CREATININE 2.80*   < >  2.74* 2.36* 2.54* 2.47* 2.17*  CALCIUM 7.8*   < > 7.9* 7.3* 7.4* 8.0* 7.0*  MG 2.1  --   --  1.4* 2.6* 2.5*  --    < > = values in this interval not displayed.   GFR: Estimated Creatinine Clearance: 17.1 mL/min (A) (by C-G formula based on SCr of 2.17 mg/dL (H)). Liver Function Tests: Recent Labs  Lab 06/20/23 0307 06/22/23 0157  AST 24 59*  ALT 19 38  ALKPHOS 54 39  BILITOT 0.4 0.4  PROT 5.2* 4.8*  ALBUMIN 2.2* 2.0*   Recent Labs  Lab 06/20/23 0307 06/22/23 0157  LIPASE 39 70*   No results for input(s): "AMMONIA" in the last 168 hours. Coagulation Profile: No results for input(s): "INR", "PROTIME" in the last 168 hours. Cardiac Enzymes: No results for  input(s): "CKTOTAL", "CKMB", "CKMBINDEX", "TROPONINI" in the last 168 hours. BNP (last 3 results) No results for input(s): "PROBNP" in the last 8760 hours. HbA1C: No results for input(s): "HGBA1C" in the last 72 hours. CBG: Recent Labs  Lab 06/24/23 0721 06/24/23 1654 06/25/23 0537 06/25/23 0807  GLUCAP 96 92 79 116*   Lipid Profile: No results for input(s): "CHOL", "HDL", "LDLCALC", "TRIG", "CHOLHDL", "LDLDIRECT" in the last 72 hours. Thyroid Function Tests: No results for input(s): "TSH", "T4TOTAL", "FREET4", "T3FREE", "THYROIDAB" in the last 72 hours. Anemia Panel: No results for input(s): "VITAMINB12", "FOLATE", "FERRITIN", "TIBC", "IRON", "RETICCTPCT" in the last 72 hours. Sepsis Labs: No results for input(s): "PROCALCITON", "LATICACIDVEN" in the last 168 hours.  Recent Results (from the past 240 hour(s))  Surgical pcr screen     Status: None   Collection Time: 06/20/23 11:05 PM   Specimen: Nasal Mucosa; Nasal Swab  Result Value Ref Range Status   MRSA, PCR NEGATIVE NEGATIVE Final   Staphylococcus aureus NEGATIVE NEGATIVE Final    Comment: (NOTE) The Xpert SA Assay (FDA approved for NASAL specimens in patients 71 years of age and older), is one component of a comprehensive surveillance program. It is not intended to diagnose infection nor to guide or monitor treatment. Performed at [facility], [address], [place], [place] [address]          Radiology Studies: DG Abd 1 View  Result Date: 06/24/2023 CLINICAL DATA:  Abdominal pain EXAM: ABDOMEN - 1 VIEW COMPARISON:  CT abdomen 06/16/2023 FINDINGS: Focal increased opacity limiting evaluation overlying the lower lumbar spine and sacrum likely secondary to technique. Gaseous distension of the colon. Right upper quadrant drain. Surgical clips in the right upper quadrant. No bowel dilatation to suggest obstruction. No evidence of pneumoperitoneum, portal venous gas or pneumatosis. No pathologic calcifications  along the expected course of the ureters. No acute osseous abnormality. Lower lumbar spine spondylosis. IMPRESSION: 1. Gaseous distension of the colon. No evidence of bowel obstruction. Electronically Signed   By: Elige Ko M.D.   On: 06/24/2023 10:37        Scheduled Meds:  acetaminophen  650 mg Oral Q6H   Or   acetaminophen  650 mg Rectal Q6H   amLODipine  10 mg Oral Daily   atenolol  25 mg Oral Daily   docusate sodium  100 mg Oral BID   feeding supplement  237 mL Oral TID BM   folic acid  1 mg Oral Daily   hydrALAZINE  50 mg Oral Q8H   pantoprazole  40 mg Oral BID   polyethylene glycol  17 g Oral Daily   Continuous Infusions:  sodium chloride  75 mL/hr at 06/25/23 0404      LOS: 8 days    Time spent: 48 minutes spent on chart review, discussion with nursing staff, consultants, updating family and interview/physical exam; more than 50% of that time was spent in counseling and/or coordination of care.    Alvira Philips [place], DO [facility] Available via Epic secure chat 7am-7pm After these hours, please refer to coverage provider listed on [email or web address] 06/25/2023, 9:30 AM

## 2023-06-25 NOTE — Progress Notes (Signed)
4 Days Post-Op   Subjective/Chief Complaint: Feels much better this morning.  Ate a full breakfast without issue.   Objective: Vital signs in last 24 hours: Temp:  [97.9 F (36.6 C)-98.4 F (36.9 C)] 97.9 F (36.6 C) (08/18 0808) Pulse Rate:  [69-75] 73 (08/18 0808) Resp:  [16-17] 17 (08/18 0808) BP: (158-180)/(55-112) 158/59 (08/18 0808) SpO2:  [93 %-97 %] 95 % (08/18 0808) Last BM Date : 06/19/23  Intake/Output from previous day: 08/17 0701 - 08/18 0700 In: 1531.2 [P.O.:240; I.V.:1291.2] Out: 500 [Urine:500] Intake/Output this shift: Total I/O In: 240 [P.O.:240] Out: 600 [Urine:600]  Alert, well appearing Unlabored respirations Abd soft, nondistended, appropriately tender around incisions which are c/d/I with dermabond, no cellulitis or hematoma Drain output is serosanguinous  Lab Results:  Recent Labs    06/25/23 0629  WBC 12.7*  HGB 8.7*  HCT 27.8*  PLT 167   BMET Recent Labs    06/24/23 0604 06/25/23 0629  NA 133* 134*  K 4.7 4.4  CL 99 105  CO2 26 20*  GLUCOSE 105* 83  BUN 38* 36*  CREATININE 2.47* 2.17*  CALCIUM 8.0* 7.0*   PT/INR No results for input(s): "LABPROT", "INR" in the last 72 hours. ABG No results for input(s): "PHART", "HCO3" in the last 72 hours.  Invalid input(s): "PCO2", "PO2"  Studies/Results: DG Abd 1 View  Result Date: 06/24/2023 CLINICAL DATA:  Abdominal pain EXAM: ABDOMEN - 1 VIEW COMPARISON:  CT abdomen 06/16/2023 FINDINGS: Focal increased opacity limiting evaluation overlying the lower lumbar spine and sacrum likely secondary to technique. Gaseous distension of the colon. Right upper quadrant drain. Surgical clips in the right upper quadrant. No bowel dilatation to suggest obstruction. No evidence of pneumoperitoneum, portal venous gas or pneumatosis. No pathologic calcifications along the expected course of the ureters. No acute osseous abnormality. Lower lumbar spine spondylosis. IMPRESSION: 1. Gaseous distension of the  colon. No evidence of bowel obstruction. Electronically Signed   By: Elige Ko M.D.   On: 06/24/2023 10:37    Anti-infectives: Anti-infectives (From admission, onward)    Start     Dose/Rate Route Frequency Ordered Stop   06/21/23 1059  ceFAZolin (ANCEF) 2-4 GM/100ML-% IVPB       Note to Pharmacy: Marijo Sanes B: cabinet override      06/21/23 1059 06/21/23 2314   06/21/23 0600  ceFAZolin (ANCEF) IVPB 2g/100 mL premix        2 g 200 mL/hr over 30 Minutes Intravenous On call to O.R. 06/20/23 1307 06/21/23 1027   06/19/23 1215  ampicillin-sulbactam (UNASYN) 1.5 g in sodium chloride 0.9 % 100 mL IVPB  Status:  Discontinued        1.5 g 200 mL/hr over 30 Minutes Intravenous  Once 06/19/23 1214 06/19/23 1547   06/17/23 1400  fosfomycin (MONUROL) packet 3 g        3 g Oral  Once 06/17/23 1300 06/17/23 1720   06/16/23 2315  cefTRIAXone (ROCEPHIN) 1 g in sodium chloride 0.9 % 100 mL IVPB        1 g 200 mL/hr over 30 Minutes Intravenous  Once 06/16/23 2313 06/17/23 0509       Assessment/Plan: Choledocholithiasis - s/p ERCP 8/12: Choledocholithiasis s/p biliary sphincterotomy, sphincteroplasty and stone extraction; nonfilling of the cystic duct/ gallbladder -cleared by cardiology, appreciate evaluation -s/p lap chole 8/14 with lysis of adhesions. Chronic cholecystitis and dense scarring of posterior liver/GB to retroperitoneum from prior open adrenalectomy.    ID - none currently FEN -  regular diet, bowel regimen VTE - SCDs, per primary Foley - none   Current dispo- patient states that she was told she would be going to rehab.  Okay for discharge from surgery standpoint when cleared medically; we will plan to remove her drain about 10 days postop in the office as long as it remains serosanguineous.     Quentin Ore, MD  [facility] Surgery 06/25/2023 9:07 AM  Please see Amion for pager number during day hours 7:00am-4:30pm

## 2023-06-25 NOTE — Plan of Care (Signed)

## 2023-06-26 ENCOUNTER — Encounter (HOSPITAL_COMMUNITY): Payer: Self-pay | Admitting: Gastroenterology

## 2023-06-26 DIAGNOSIS — N179 Acute kidney failure, unspecified: Secondary | ICD-10-CM | POA: Diagnosis not present

## 2023-06-26 LAB — CBC
HCT: 29.5 % — ABNORMAL LOW (ref 36.0–46.0)
Hemoglobin: 9.2 g/dL — ABNORMAL LOW (ref 12.0–15.0)
MCH: 30.9 pg (ref 26.0–34.0)
MCHC: 31.2 g/dL (ref 30.0–36.0)
MCV: 99 fL (ref 80.0–100.0)
Platelets: 197 10*3/uL (ref 150–400)
RBC: 2.98 MIL/uL — ABNORMAL LOW (ref 3.87–5.11)
RDW: 14.7 % (ref 11.5–15.5)
WBC: 12.1 10*3/uL — ABNORMAL HIGH (ref 4.0–10.5)
nRBC: 0 % (ref 0.0–0.2)

## 2023-06-26 LAB — BASIC METABOLIC PANEL
Anion gap: 7 (ref 5–15)
BUN: 44 mg/dL — ABNORMAL HIGH (ref 8–23)
CO2: 23 mmol/L (ref 22–32)
Calcium: 8 mg/dL — ABNORMAL LOW (ref 8.9–10.3)
Chloride: 104 mmol/L (ref 98–111)
Creatinine, Ser: 2.48 mg/dL — ABNORMAL HIGH (ref 0.44–1.00)
GFR, Estimated: 19 mL/min — ABNORMAL LOW (ref >60)
Glucose, Bld: 93 mg/dL (ref 70–99)
Potassium: 4.7 mmol/L (ref 3.5–5.1)
Sodium: 134 mmol/L — ABNORMAL LOW (ref 135–145)

## 2023-06-26 MED ORDER — ORAL CARE MOUTH RINSE: 15.0000 mL | OROMUCOSAL | Status: DC | PRN

## 2023-06-26 MED ORDER — HYDRALAZINE HCL 50 MG PO TABS
75.0000 mg | ORAL_TABLET | Freq: Three times a day (TID) | ORAL | Status: DC
  Administered 2023-06-26 (×2): 75 mg via ORAL
  Filled 2023-06-26 (×3): qty 1

## 2023-06-26 MED ORDER — MELATONIN 3 MG PO TABS
3.0000 mg | ORAL_TABLET | Freq: Every day | ORAL | Status: DC
  Administered 2023-06-26 – 2023-06-27 (×2): 3 mg via ORAL
  Filled 2023-06-26 (×2): qty 1

## 2023-06-26 MED ORDER — SENNOSIDES-DOCUSATE SODIUM 8.6-50 MG PO TABS
1.0000 | ORAL_TABLET | Freq: Two times a day (BID) | ORAL | Status: DC
  Administered 2023-06-26 – 2023-06-27 (×3): 1 via ORAL
  Filled 2023-06-26 (×3): qty 1

## 2023-06-26 NOTE — TOC Progression Note (Signed)
Transition of Care [facility]) - Progression Note    Patient Details  Name: Amy [name] MRN: [id number] Date of Birth: 01/19/1936  [facility] CM/SW Contact  Krysia Zahradnik A [place], [place] Phone Number: 06/26/2023, 5:23 PM  Clinical Narrative:     CSW presented bed offers over the phone to pt's daughter, Para March.  CSW followed up with Para March who stated she visited [place] and was interested in them for SNF.   Next steps for insurance authorization and contact facility.   TOC will continue to follow.   Expected Discharge Plan: Skilled Nursing Facility Barriers to Discharge: Continued Medical Work up, SNF Pending bed offer, English as a second language teacher  Expected Discharge Plan and Services In-house Referral: Clinical Social Work     Living arrangements for the past 2 months: Single Family Home                                       Social Determinants of Health (SDOH) Interventions SDOH Screenings   Food Insecurity: No Food Insecurity (06/17/2023)  Housing: Low Risk  (06/17/2023)  Transportation Needs: No Transportation Needs (06/17/2023)  Utilities: Not At Risk (06/17/2023)  Tobacco Use: Medium Risk (06/21/2023)    Readmission Risk Interventions     No data to display

## 2023-06-26 NOTE — Progress Notes (Signed)
Physical Therapy Treatment Patient Details Name: Amy [name] MRN: [id number] DOB: [birth year] Today's Date: 06/26/2023   History of Present Illness 87 y.o. female adm to [facility] 8/9 for acute renal failure on CKD with FOBT. 8/10 transfer to [facility]. 8/12 ERCP with choledocholithiasis. 8/14 s/p lap chole with lysis of adhesions. PMHx: HTN, GERD, CKD III.    PT Comments  Pt pleasant and reports living alone and being independent at baseline with use of incontinence briefs. Pt with incontinence throughout mobility with assist for pericare and linen change. PT continues to require assist with all transfers and gait needing further therapy prior to return home. PT encouraged to be OOB for meals and up to toilet. Educated for [employer] and encouraged daily performance. Will continue to follow.     If plan is discharge home, recommend the following: A lot of help with bathing/dressing/bathroom;Assistance with cooking/housework;Help with stairs or ramp for entrance;Assist for transportation;Direct supervision/assist for medications management;A lot of help with walking and/or transfers   Can travel by private vehicle     Yes  Equipment Recommendations  Rolling walker (2 wheels)    Recommendations for Other Services       Precautions / Restrictions Precautions Precautions: Fall Precaution Comments: JP drain, urinary incontinence     Mobility  Bed Mobility Overal bed mobility: Needs Assistance Bed Mobility: Supine to Sit     Supine to sit: Contact guard, HOB elevated, Used rails     General bed mobility comments: CGA assist with rail and increased time. pt noted to have leaking jp drain as well as urinary incontinence on arrival and with all movement, assist for pericare and linen change    Transfers Overall transfer level: Needs assistance   Transfers: Sit to/from Stand, Bed to chair/wheelchair/BSC Sit to Stand: Min assist Stand pivot transfers: Min assist         General transfer  comment: min assist with cues for hand placement. pt able to pivot bed to [facility] with limited UE support without AD    Ambulation/Gait Ambulation/Gait assistance: Min assist Gait Distance (Feet): 30 Feet Assistive device: Rolling walker (2 wheels) Gait Pattern/deviations: Step-through pattern, Decreased stride length, Trunk flexed   Gait velocity interpretation: <1.8 ft/sec, indicate of risk for recurrent falls   General Gait Details: pt with flexed trunk, self too posterior to RW with mod cues for proximity and safety with min assist to direct RW   Stairs             Wheelchair Mobility     Tilt Bed    Modified Rankin (Stroke Patients Only)       Balance Overall balance assessment: Needs assistance Sitting-balance support: Bilateral upper extremity supported, Feet supported Sitting balance-Leahy Scale: Fair     Standing balance support: Bilateral upper extremity supported, During functional activity, Reliant on assistive device for balance Standing balance-Leahy Scale: Poor Standing balance comment: Rw in standing                            Cognition Arousal: Alert Behavior During Therapy: WFL for tasks assessed/performed Overall Cognitive Status: No family/caregiver present to determine baseline cognitive functioning Area of Impairment: Memory, Following commands, Safety/judgement                   Current Attention Level: Selective Memory: Decreased short-term memory Following Commands: Follows one step commands consistently Safety/Judgement: Decreased awareness of deficits, Decreased awareness of safety  Exercises General Exercises - Lower Extremity Long Arc Quad: AROM, Both, Seated, 15 reps Hip ABduction/ADduction: AROM, Both, Seated, 15 reps Hip Flexion/Marching: AROM, 15 reps, Both, Seated    General Comments        Pertinent Vitals/Pain Pain Assessment Pain Score: 3  Pain Location: Rt flank Pain Descriptors /  Indicators: Sore Pain Intervention(s): Limited activity within patient's tolerance, Repositioned, Monitored during session    Home Living                          Prior Function            PT Goals (current goals can now be found in the care plan section) Progress towards PT goals: Progressing toward goals    Frequency    Min 1X/week      PT Plan      Co-evaluation              AM-PAC PT "6 Clicks" Mobility   Outcome Measure  Help needed turning from your back to your side while in a flat bed without using bedrails?: A Little Help needed moving from lying on your back to sitting on the side of a flat bed without using bedrails?: A Little Help needed moving to and from a bed to a chair (including a wheelchair)?: A Little Help needed standing up from a chair using your arms (e.g., wheelchair or bedside chair)?: A Little Help needed to walk in hospital room?: A Little Help needed climbing 3-5 steps with a railing? : Total 6 Click Score: 16    End of Session Equipment Utilized During Treatment: Gait belt Activity Tolerance: Patient tolerated treatment well Patient left: in chair;with call bell/phone within reach;with chair alarm set Nurse Communication: Mobility status PT Visit Diagnosis: Muscle weakness (generalized) (M62.81);Difficulty in walking, not elsewhere classified (R26.2);Other abnormalities of gait and mobility (R26.89);Unsteadiness on feet (R26.81)     Time: 4098-1191 PT Time Calculation (min) (ACUTE ONLY): 29 min  Charges:    $Gait Training: 8-22 mins $Therapeutic Activity: 8-22 mins PT General Charges $$ ACUTE PT VISIT: 1 Visit                     Merryl Hacker, PT Acute Rehabilitation Services Office: [phone]    Amy [name] 06/26/2023, 12:19 PM

## 2023-06-26 NOTE — Progress Notes (Addendum)
PROGRESS NOTE    Amy [name]  WUJ:[id number] DOB: 11-25-1935 DOA: 06/16/2023 PCP: Geoffry Paradise, MD    Brief Narrative:   Amy [name] is a 87 y.o. female with past medical history significant for HTN, GERD, iron deficiency anemia, CKD stage IIIb/IV, PUD, who presented to [facility] ED on 8/9 by direction of her outpatient PCP for concerns of worsening kidney function.  Patient with elevated creatinine of 3.5; in which she had been on HCTZ which has been held.  Patient endorses nausea/vomiting, abdominal pain localized in the epigastric area.  Also associated with progressive weakness.  In the ED, temperature 97.8 F, HR 65, RR 18, BP 143/46, SpO2 100% on room air.  WBC 13.7, hemoglobin 10.9, platelets 222.  Sodium 124, potassium 5.5, chloride 97, CO2 16, glucose 123, BUN 59, creatinine 3.45.  AST 15, ALT 14, total bilirubin 0.6.  Urinalysis with large leukocytes, negative nitrite, few bacteria, greater than 50 WBCs.  FOBT positive.  CT abdomen/pelvis without contrast with persistent cholelithiasis and persistent choledocholithiasis with slightly increased size, bile duct dilation, new CT finding of acute cholecystitis, multilevel severe degenerative changes spine with impacted/fused L4-5 levels and associated grade 2 anterolisthesis L4 on L5, trace hiatal hernia.  Patient received 1 L NS bolus.  GI was consulted.  TRH consulted for admission for further evaluation and management.  Patient was transferred to [facility].  Assessment & Plan:   Acute renal failure on CKD stage IIIb/IV Nonanion gap metabolic acidosis: Resolved Patient staying with a creatinine of 3.45.  Review of EMR notable for baseline creatinine 1.9 2018. -- Cr 3.45>>2.86>2.74>2.36>2.54>2.47>2.17>2.48 -- serum CO2 16>>24>27 -- Continue to hold home olmesartan, likely dc on discharge given low GFR -- BMP daily -- Would benefit from outpatient nephrology follow-up  Upper GI bleed secondary to peptic  ulcer Patient presenting with a hemoglobin of 10.9, baseline 13.  FOBT positive.  History of peptic ulcer disease.  Patient underwent ERCP on 06/19/2023 with findings of 8 mm ulcer prepyloric area without active bleeding, biopsy showing chronic gastritis and no H. pylori.  Anemia panel with iron 62, TIBC 220, ferritin 28, folate 4.8, vitamin B12 459. -- Hgb 10.9>>8.6>9.3>8.9>8.7>9.2 -- Folic acid 1 mg p.o. daily -- Protonix 40 mg p.o. twice daily x 8 weeks followed by once daily -- CBC daily  Choledocholithiasis Chronic cholelithiasis CT abdomen/pelvis with persistent cholelithiasis/choledocholithiasis with slightly increased size of bile duct.  GI was consulted and patient underwent ERCP 8/12 with findings significant for CBD dilated to 15 mm with multiple filling defects s/p sphincterotomy and sweeping of the biliary tree with extraction of stones, nonfilling of the cystic duct/gallbladder.  General surgery was consulted and patient underwent laparoscopic cholecystectomy with lysis of adhesions by Dr. Fredricka Bonine on 06/21/2023.  -- Drain output past 24h -- Regular diet  Folic acid deficiency Folic acid low at 4.8. -- Folic acid 1 mg p.o. daily  Essential hypertension -- Amlodipine 10 mg p.o. daily -- Atenolol 25 mg p.o. daily -- Increase hydralazine to 75 mg p.o. every 8 hours -- Holding home olmesartan due to AKI as above, likely will discontinue given her CKD status with GFR less than 30  Hyperkalemia: Resolved Hypokalemia Potassium 5.5 on admission, likely secondary to acute renal failure.  Improved with IV fluid hydration, now 4.7 today -- BMP in the a.m.  Hypomagnesemia: Resolved Repleted  Hyponatremia: Resolved Etiology likely secondary to poor oral intake. -- Na 124>>134>136>135>133>134 -- HCTZ discontinued -- NS at 100 mL/h --  BMP daily  Right foot pain Patient course intermittent pain to her right foot.  X-ray with no acute findings. Resting right and left  ankle-brachial index indicates noncompressible right lower extremity arteries with abnormal toe brachial index. -- Recommend outpatient follow-up with vascular surgery.  Weakness/debility/deconditioning: Patient ambulates with the use of a walker/cane at baseline. -- PT/OT evaluation: SNF -- TOC for placement   DVT prophylaxis: SCDs Start: 06/17/23 0647    Code Status: DNR Family Communication: No family present at bedside this morning  Disposition Plan:  Level of care: Med-Surg Status is: Inpatient Remains inpatient appropriate because: Pending SNF placement, medically stable for discharge once bed available    Consultants:  [facility] [facility]  Procedures:  ERCP 8/12 Laparoscopic cholecystectomy with LOA 8/14, Dr. Fredricka Bonine  Antimicrobials:  Ceftriaxone 8/9 - 8/9  fosfomycin 8/10 - 8/10  ciprofloxacin 8/12 - 8/12   Subjective: Patient seen examined bedside, resting comfortably.  Lying in bed.  Eating breakfast.  No specific complaints other than some generalized tenderness around her surgical sites.  No further nausea/vomiting, tolerating diet.  Reports flatus, no bowel movement as of yet. Denies visual disturbance, no dizziness, no chest pain, no palpitations, no shortness of breath, no abdominal pain, no focal weakness, no fatigue, no paresthesias.  No acute events overnight per nursing staff.  Pending discharge to SNF, medically stable for discharge once bed available.  Objective: Vitals:   06/25/23 1507 06/25/23 2013 06/26/23 0454 06/26/23 0817  BP: (!) 176/49 (!) 155/74 (!) 140/43 (!) 163/57  Pulse: 80 79 72 75  Resp: 18 15 16 15   Temp: 97.9 F (36.6 C) 98 F (36.7 C) 98.5 F (36.9 C) 98 F (36.7 C)  TempSrc:  Oral Oral Oral  SpO2: 97% 90% 93% 95%  Weight:      Height:        Intake/Output Summary (Last 24 hours) at 06/26/2023 2956 Last data filed at 06/26/2023 0600 Gross per 24 hour  Intake 1620.31 ml  Output 200 ml  Net 1420.31 ml   Filed  Weights   06/19/23 1210 06/21/23 1101 06/23/23 0333  Weight: 63.3 kg 64 kg 64 kg    Examination:  Physical Exam: GEN: NAD, alert and oriented x 3, elderly in appearance HEENT: NCAT, PERRL, EOMI, sclera clear, MMM PULM: CTAB w/o wheezes/crackles, normal respiratory effort, on room air CV: RRR w/o M/G/R GI: abd soft, NTND, + bowel sounds, no R/G/M, noted laparoscopic port sites to abdomen with Dermabond in place, no surrounding fluctuance/erythema, abdominal drain noted MSK: no peripheral edema, moves all extremities dependently NEURO: No focal neurological deficit PSYCH: normal mood/affect Integumentary: Surgical port sites as above, otherwise no other concerning rashes/lesions/wounds noted on exposed skin surfaces.     Data Reviewed: I have personally reviewed following labs and imaging studies  CBC: Recent Labs  Lab 06/21/23 0641 06/22/23 0157 06/25/23 0629 06/26/23 0315  WBC 13.4* 10.0 12.7* 12.1*  HGB 8.9* 8.7* 8.7* 9.2*  HCT 27.2* 26.1* 27.8* 29.5*  MCV 92.5 94.2 98.9 99.0  PLT 214 179 167 197   Basic Metabolic Panel: Recent Labs  Lab 06/22/23 0157 06/23/23 0934 06/24/23 0604 06/25/23 0629 06/26/23 0315  NA 135 132* 133* 134* 134*  K 3.2* 4.0 4.7 4.4 4.7  CL 99 97* 99 105 104  CO2 27 24 26  20* 23  GLUCOSE 120* 151* 105* 83 93  BUN 30* 35* 38* 36* 44*  CREATININE 2.36* 2.54* 2.47* 2.17* 2.48*  CALCIUM 7.3* 7.4* 8.0* 7.0* 8.0*  MG 1.4* 2.6* 2.5*  --   --    GFR: Estimated Creatinine Clearance: 14.9 mL/min (A) (by C-G formula based on SCr of 2.48 mg/dL (H)). Liver Function Tests: Recent Labs  Lab 06/20/23 0307 06/22/23 0157  AST 24 59*  ALT 19 38  ALKPHOS 54 39  BILITOT 0.4 0.4  PROT 5.2* 4.8*  ALBUMIN 2.2* 2.0*   Recent Labs  Lab 06/20/23 0307 06/22/23 0157  LIPASE 39 70*   No results for input(s): "AMMONIA" in the last 168 hours. Coagulation Profile: No results for input(s): "INR", "PROTIME" in the last 168 hours. Cardiac Enzymes: No  results for input(s): "CKTOTAL", "CKMB", "CKMBINDEX", "TROPONINI" in the last 168 hours. BNP (last 3 results) No results for input(s): "PROBNP" in the last 8760 hours. HbA1C: No results for input(s): "HGBA1C" in the last 72 hours. CBG: Recent Labs  Lab 06/24/23 0721 06/24/23 1654 06/25/23 0537 06/25/23 0807  GLUCAP 96 92 79 116*   Lipid Profile: No results for input(s): "CHOL", "HDL", "LDLCALC", "TRIG", "CHOLHDL", "LDLDIRECT" in the last 72 hours. Thyroid Function Tests: No results for input(s): "TSH", "T4TOTAL", "FREET4", "T3FREE", "THYROIDAB" in the last 72 hours. Anemia Panel: No results for input(s): "VITAMINB12", "FOLATE", "FERRITIN", "TIBC", "IRON", "RETICCTPCT" in the last 72 hours. Sepsis Labs: No results for input(s): "PROCALCITON", "LATICACIDVEN" in the last 168 hours.  Recent Results (from the past 240 hour(s))  Surgical pcr screen     Status: None   Collection Time: 06/20/23 11:05 PM   Specimen: Nasal Mucosa; Nasal Swab  Result Value Ref Range Status   MRSA, PCR NEGATIVE NEGATIVE Final   Staphylococcus aureus NEGATIVE NEGATIVE Final    Comment: (NOTE) The Xpert SA Assay (FDA approved for NASAL specimens in patients 57 years of age and older), is one component of a comprehensive surveillance program. It is not intended to diagnose infection nor to guide or monitor treatment. Performed at [facility], [address], [place], [place] [address]          Radiology Studies: No results found.      Scheduled Meds:  acetaminophen  650 mg Oral Q6H   Or   acetaminophen  650 mg Rectal Q6H   amLODipine  10 mg Oral Daily   atenolol  25 mg Oral Daily   feeding supplement  237 mL Oral TID BM   folic acid  1 mg Oral Daily   hydrALAZINE  50 mg Oral Q8H   pantoprazole  40 mg Oral BID   polyethylene glycol  17 g Oral Daily   senna-docusate  1 tablet Oral BID   Continuous Infusions:  sodium chloride 100 mL/hr at 06/26/23 0745      LOS: 9 days     Time spent: 48 minutes spent on chart review, discussion with nursing staff, consultants, updating family and interview/physical exam; more than 50% of that time was spent in counseling and/or coordination of care.    Alvira Philips [place], DO [facility] Available via Epic secure chat 7am-7pm After these hours, please refer to coverage provider listed on [email or web address] 06/26/2023, 9:18 AM

## 2023-06-26 NOTE — Progress Notes (Signed)
5 Days Post-Op   Subjective/Chief Complaint: Pt doing well this AM    Objective: Vital signs in last 24 hours: Temp:  [97.9 F (36.6 C)-98.5 F (36.9 C)] 98 F (36.7 C) (08/19 0817) Pulse Rate:  [72-80] 75 (08/19 0817) Resp:  [15-18] 15 (08/19 0817) BP: (140-176)/(43-74) 163/57 (08/19 0817) SpO2:  [90 %-97 %] 95 % (08/19 0817) Last BM Date : 06/19/23  Intake/Output from previous day: 08/18 0701 - 08/19 0700 In: 1860.3 [P.O.:477; I.V.:1383.3] Out: 800 [Urine:600; Drains:200] Intake/Output this shift: No intake/output data recorded.  PE: Alert, well appearing Unlabored respirations Abd soft, nondistended, appropriately tender around incisions which are c/d/I with dermabond, no cellulitis or hematoma Drain output is serosanguinous  Lab Results:  Recent Labs    06/25/23 0629 06/26/23 0315  WBC 12.7* 12.1*  HGB 8.7* 9.2*  HCT 27.8* 29.5*  PLT 167 197   BMET Recent Labs    06/25/23 0629 06/26/23 0315  NA 134* 134*  K 4.4 4.7  CL 105 104  CO2 20* 23  GLUCOSE 83 93  BUN 36* 44*  CREATININE 2.17* 2.48*  CALCIUM 7.0* 8.0*   PT/INR No results for input(s): "LABPROT", "INR" in the last 72 hours. ABG No results for input(s): "PHART", "HCO3" in the last 72 hours.  Invalid input(s): "PCO2", "PO2"  Studies/Results: DG Abd 1 View  Result Date: 06/24/2023 CLINICAL DATA:  Abdominal pain EXAM: ABDOMEN - 1 VIEW COMPARISON:  CT abdomen 06/16/2023 FINDINGS: Focal increased opacity limiting evaluation overlying the lower lumbar spine and sacrum likely secondary to technique. Gaseous distension of the colon. Right upper quadrant drain. Surgical clips in the right upper quadrant. No bowel dilatation to suggest obstruction. No evidence of pneumoperitoneum, portal venous gas or pneumatosis. No pathologic calcifications along the expected course of the ureters. No acute osseous abnormality. Lower lumbar spine spondylosis. IMPRESSION: 1. Gaseous distension of the colon. No  evidence of bowel obstruction. Electronically Signed   By: Elige Ko M.D.   On: 06/24/2023 10:37    Anti-infectives: Anti-infectives (From admission, onward)    Start     Dose/Rate Route Frequency Ordered Stop   06/21/23 1059  ceFAZolin (ANCEF) 2-4 GM/100ML-% IVPB       Note to Pharmacy: Marijo Sanes B: cabinet override      06/21/23 1059 06/21/23 2314   06/21/23 0600  ceFAZolin (ANCEF) IVPB 2g/100 mL premix        2 g 200 mL/hr over 30 Minutes Intravenous On call to O.R. 06/20/23 1307 06/21/23 1027   06/19/23 1215  ampicillin-sulbactam (UNASYN) 1.5 g in sodium chloride 0.9 % 100 mL IVPB  Status:  Discontinued        1.5 g 200 mL/hr over 30 Minutes Intravenous  Once 06/19/23 1214 06/19/23 1547   06/17/23 1400  fosfomycin (MONUROL) packet 3 g        3 g Oral  Once 06/17/23 1300 06/17/23 1720   06/16/23 2315  cefTRIAXone (ROCEPHIN) 1 g in sodium chloride 0.9 % 100 mL IVPB        1 g 200 mL/hr over 30 Minutes Intravenous  Once 06/16/23 2313 06/17/23 0509       Assessment/Plan: Choledocholithiasis - s/p ERCP 8/12: Choledocholithiasis s/p biliary sphincterotomy, sphincteroplasty and stone extraction; nonfilling of the cystic duct/ gallbladder -cleared by cardiology, appreciate evaluation -s/p lap chole 8/14 with lysis of adhesions. Chronic cholecystitis and dense scarring of posterior liver/GB to retroperitoneum from prior open adrenalectomy.    ID - none currently FEN -regular diet,  bowel regimen VTE - SCDs, per primary Foley - none   Current dispo- patient states that she was told she would be going to rehab.  Okay for discharge from surgery standpoint when cleared medically; we will plan to remove her drain about 10 days postop in the office as long as it remains serosanguineous.  LOS: 9 days    Axel Filler 06/26/2023

## 2023-06-26 NOTE — Plan of Care (Signed)
Patient without distress or complaints this shift. All needs attended to. Will continue to monitor.  Problem: Clinical Measurements: Goal: Ability to maintain clinical measurements within normal limits will improve Outcome: Progressing   Problem: Skin Integrity: Goal: Risk for impaired skin integrity will decrease Outcome: Progressing   Problem: Education: Goal: Knowledge of General Education information will improve Description: Including pain rating scale, medication(s)/side effects and non-pharmacologic comfort measures Outcome: Progressing

## 2023-06-27 DIAGNOSIS — N179 Acute kidney failure, unspecified: Secondary | ICD-10-CM | POA: Diagnosis not present

## 2023-06-27 MED ORDER — TRAZODONE HCL 50 MG PO TABS
50.0000 mg | ORAL_TABLET | Freq: Every evening | ORAL | Status: DC | PRN
  Administered 2023-06-27 (×2): 50 mg via ORAL
  Filled 2023-06-27 (×2): qty 1

## 2023-06-27 NOTE — Progress Notes (Signed)
Scheduled hydralazine was held per Tereasa Coop, MD's order. BP 132/44.  [name]

## 2023-06-27 NOTE — Progress Notes (Addendum)
  Patient's bedside nurse reported that her diastolic blood pressure is low low 44.  She has been scheduled for hydralazine 75 mg at this time now.  Per chart review patient systolic blood pressure in between 120 to 160 and diastolic blood pressure in between 44 to 68.  Currently she is on amlodipine 10 mg daily and atenolol 25 mg daily. Hydralazine dose was just increased yesterday to 75 mg 3 times daily. Echocardiogram from 06/20/2023 showed preserved EF 60 to 65% left ventricular diastolic parameter intermittent, no left ventricular hypertrophy.  -Given diastolic blood pressure is very low recommended to hold the hydralazine for now.  Continue to monitor the blood pressure. -Deferring adjustment dose of amlodipine and atenolol to daytime physician.   Tereasa Coop, MD [facility] 06/27/2023, 6:47 AM

## 2023-06-27 NOTE — Plan of Care (Signed)

## 2023-06-27 NOTE — Progress Notes (Signed)
PROGRESS NOTE    Amy [name]  UJW:[id number] DOB: [birth year] DOA: 06/16/2023 PCP: Geoffry Paradise, MD    Brief Narrative:   Amy [name] is a 87 y.o. female with past medical history significant for HTN, GERD, iron deficiency anemia, CKD stage IIIb/IV, PUD, who presented to [facility] ED on 8/9 by direction of her outpatient PCP for concerns of worsening kidney function.  Patient with elevated creatinine of 3.5; in which she had been on HCTZ which has been held.  Patient endorses nausea/vomiting, abdominal pain localized in the epigastric area.  Also associated with progressive weakness.  In the ED, temperature 97.8 F, HR 65, RR 18, BP 143/46, SpO2 100% on room air.  WBC 13.7, hemoglobin 10.9, platelets 222.  Sodium 124, potassium 5.5, chloride 97, CO2 16, glucose 123, BUN 59, creatinine 3.45.  AST 15, ALT 14, total bilirubin 0.6.  Urinalysis with large leukocytes, negative nitrite, few bacteria, greater than 50 WBCs.  FOBT positive.  CT abdomen/pelvis without contrast with persistent cholelithiasis and persistent choledocholithiasis with slightly increased size, bile duct dilation, new CT finding of acute cholecystitis, multilevel severe degenerative changes spine with impacted/fused L4-5 levels and associated grade 2 anterolisthesis L4 on L5, trace hiatal hernia.  Patient received 1 L NS bolus.  GI was consulted.  TRH consulted for admission for further evaluation and management.  Patient was transferred to [facility].  Assessment & Plan:   Acute renal failure on CKD stage IIIb/IV Nonanion gap metabolic acidosis: Resolved Patient staying with a creatinine of 3.45.  Review of EMR notable for baseline creatinine 1.9 2018. -- Cr 3.45>>2.86>2.74>2.36>2.54>2.47>2.17>2.48 -- serum CO2 16>>24>27 -- Continue to hold home olmesartan, likely dc on discharge given low GFR -- BMP daily -- Would benefit from outpatient nephrology follow-up  Upper GI bleed secondary to peptic  ulcer Patient presenting with a hemoglobin of 10.9, baseline 13.  FOBT positive.  History of peptic ulcer disease.  Patient underwent ERCP on 06/19/2023 with findings of 8 mm ulcer prepyloric area without active bleeding, biopsy showing chronic gastritis and no H. pylori.  Anemia panel with iron 62, TIBC 220, ferritin 28, folate 4.8, vitamin B12 459. -- Hgb 10.9>>8.6>9.3>8.9>8.7>9.2 -- Folic acid 1 mg p.o. daily -- Protonix 40 mg p.o. twice daily x 8 weeks followed by once daily -- CBC daily  Choledocholithiasis Chronic cholelithiasis CT abdomen/pelvis with persistent cholelithiasis/choledocholithiasis with slightly increased size of bile duct.  GI was consulted and patient underwent ERCP 8/12 with findings significant for CBD dilated to 15 mm with multiple filling defects s/p sphincterotomy and sweeping of the biliary tree with extraction of stones, nonfilling of the cystic duct/gallbladder.  General surgery was consulted and patient underwent laparoscopic cholecystectomy with lysis of adhesions by Dr. Fredricka Bonine on 06/21/2023.  -- Drain output 60mL past 24h -- Regular diet  Folic acid deficiency Folic acid low at 4.8. -- Folic acid 1 mg p.o. daily  Essential hypertension -- Amlodipine 10 mg p.o. daily -- Atenolol 25 mg p.o. daily -- Hydralazine discontinued due to low diastolic BP -- Holding home olmesartan due to AKI as above, likely will discontinue given her CKD status with GFR less than 30 -- Continue to monitor BP  Hyperkalemia: Resolved Hypokalemia Potassium 5.5 on admission, likely secondary to acute renal failure.  Improved with IV fluid hydration, now 4.7 today -- BMP in the a.m.  Hypomagnesemia: Resolved Repleted  Hyponatremia: Resolved Etiology likely secondary to poor oral intake. -- Na 124>>134>136>135>133>134 -- NS at 100 mL/h --  BMP daily  Right foot pain Patient course intermittent pain to her right foot.  X-ray with no acute findings. Resting right and left  ankle-brachial index indicates noncompressible right lower extremity arteries with abnormal toe brachial index. -- Recommend outpatient follow-up with vascular surgery.  Weakness/debility/deconditioning: Patient ambulates with the use of a walker/cane at baseline. -- PT/OT evaluation: SNF -- TOC for placement   DVT prophylaxis: SCDs Start: 06/17/23 0647    Code Status: DNR Family Communication: No family present at bedside this morning  Disposition Plan:  Level of care: Med-Surg Status is: Inpatient Remains inpatient appropriate because: Pending SNF placement, medically stable for discharge once bed available    Consultants:  [facility]  Procedures:  ERCP 8/12 Laparoscopic cholecystectomy with LOA 8/14, Dr. Fredricka Bonine  Antimicrobials:  Ceftriaxone 8/9 - 8/9  fosfomycin 8/10 - 8/10  ciprofloxacin 8/12 - 8/12   Subjective: Patient seen examined bedside, resting comfortably.  Lying in bed.  Sleeping but arousable.  No specific complaints this morning.  Nausea resolved, minimal abdominal pain.  Reports passing flatus, but no bowel movement as of yet. Denies visual disturbance, no dizziness, no chest pain, no palpitations, no shortness of breath, no abdominal pain, no focal weakness, no fatigue, no paresthesias.  No acute events overnight per nursing staff.  Pending discharge to SNF, medically stable for discharge once bed available.  Objective: Vitals:   06/26/23 2352 06/27/23 0610 06/27/23 0638 06/27/23 0737  BP: (!) 137/55 (!) 120/48 (!) 132/44 (!) 152/75  Pulse: 75 66 64 71  Resp: 18 18 18 18   Temp: 97.7 F (36.5 C) (!) 97.4 F (36.3 C)  (!) 97.5 F (36.4 C)  TempSrc: Oral   Oral  SpO2: 92% 94%  93%  Weight:      Height:        Intake/Output Summary (Last 24 hours) at 06/27/2023 0926 Last data filed at 06/27/2023 0725 Gross per 24 hour  Intake 3295.36 ml  Output 440 ml  Net 2855.36 ml   Filed Weights   06/19/23 1210 06/21/23 1101 06/23/23 0333   Weight: 63.3 kg 64 kg 64 kg    Examination:  Physical Exam: GEN: NAD, alert and oriented x 3, elderly in appearance HEENT: NCAT, PERRL, EOMI, sclera clear, MMM PULM: CTAB w/o wheezes/crackles, normal respiratory effort, on room air CV: RRR w/o M/G/R GI: abd soft, NTND, + bowel sounds, no R/G/M, noted laparoscopic port sites to abdomen with Dermabond in place, no surrounding fluctuance/erythema, abdominal drain noted MSK: no peripheral edema, moves all extremities dependently NEURO: No focal neurological deficit PSYCH: normal mood/affect Integumentary: Surgical port sites as above, otherwise no other concerning rashes/lesions/wounds noted on exposed skin surfaces.     Data Reviewed: I have personally reviewed following labs and imaging studies  CBC: Recent Labs  Lab 06/21/23 0641 06/22/23 0157 06/25/23 0629 06/26/23 0315  WBC 13.4* 10.0 12.7* 12.1*  HGB 8.9* 8.7* 8.7* 9.2*  HCT 27.2* 26.1* 27.8* 29.5*  MCV 92.5 94.2 98.9 99.0  PLT 214 179 167 197   Basic Metabolic Panel: Recent Labs  Lab 06/22/23 0157 06/23/23 0934 06/24/23 0604 06/25/23 0629 06/26/23 0315  NA 135 132* 133* 134* 134*  K 3.2* 4.0 4.7 4.4 4.7  CL 99 97* 99 105 104  CO2 27 24 26  20* 23  GLUCOSE 120* 151* 105* 83 93  BUN 30* 35* 38* 36* 44*  CREATININE 2.36* 2.54* 2.47* 2.17* 2.48*  CALCIUM 7.3* 7.4* 8.0* 7.0* 8.0*  MG 1.4* 2.6* 2.5*  --   --  GFR: Estimated Creatinine Clearance: 14.9 mL/min (A) (by C-G formula based on SCr of 2.48 mg/dL (H)). Liver Function Tests: Recent Labs  Lab 06/22/23 0157  AST 59*  ALT 38  ALKPHOS 39  BILITOT 0.4  PROT 4.8*  ALBUMIN 2.0*   Recent Labs  Lab 06/22/23 0157  LIPASE 70*   No results for input(s): "AMMONIA" in the last 168 hours. Coagulation Profile: No results for input(s): "INR", "PROTIME" in the last 168 hours. Cardiac Enzymes: No results for input(s): "CKTOTAL", "CKMB", "CKMBINDEX", "TROPONINI" in the last 168 hours. BNP (last 3  results) No results for input(s): "PROBNP" in the last 8760 hours. HbA1C: No results for input(s): "HGBA1C" in the last 72 hours. CBG: Recent Labs  Lab 06/24/23 0721 06/24/23 1654 06/25/23 0537 06/25/23 0807  GLUCAP 96 92 79 116*   Lipid Profile: No results for input(s): "CHOL", "HDL", "LDLCALC", "TRIG", "CHOLHDL", "LDLDIRECT" in the last 72 hours. Thyroid Function Tests: No results for input(s): "TSH", "T4TOTAL", "FREET4", "T3FREE", "THYROIDAB" in the last 72 hours. Anemia Panel: No results for input(s): "VITAMINB12", "FOLATE", "FERRITIN", "TIBC", "IRON", "RETICCTPCT" in the last 72 hours. Sepsis Labs: No results for input(s): "PROCALCITON", "LATICACIDVEN" in the last 168 hours.  Recent Results (from the past 240 hour(s))  Surgical pcr screen     Status: None   Collection Time: 06/20/23 11:05 PM   Specimen: Nasal Mucosa; Nasal Swab  Result Value Ref Range Status   MRSA, PCR NEGATIVE NEGATIVE Final   Staphylococcus aureus NEGATIVE NEGATIVE Final    Comment: (NOTE) The Xpert SA Assay (FDA approved for NASAL specimens in patients 15 years of age and older), is one component of a comprehensive surveillance program. It is not intended to diagnose infection nor to guide or monitor treatment. Performed at [facility], [address] [place], [place] [address]          Radiology Studies: No results found.      Scheduled Meds:  acetaminophen  650 mg Oral Q6H   Or   acetaminophen  650 mg Rectal Q6H   amLODipine  10 mg Oral Daily   atenolol  25 mg Oral Daily   feeding supplement  237 mL Oral TID BM   folic acid  1 mg Oral Daily   melatonin  3 mg Oral QHS   pantoprazole  40 mg Oral BID   polyethylene glycol  17 g Oral Daily   senna-docusate  1 tablet Oral BID   Continuous Infusions:  sodium chloride 100 mL/hr at 06/27/23 0725      LOS: 10 days    Time spent: 48 minutes spent on chart review, discussion with nursing staff, consultants, updating  family and interview/physical exam; more than 50% of that time was spent in counseling and/or coordination of care.    Alvira Philips [place], DO [facility] Available via Epic secure chat 7am-7pm After these hours, please refer to coverage provider listed on [email or web address] 06/27/2023, 9:26 AM

## 2023-06-27 NOTE — Plan of Care (Signed)

## 2023-06-27 NOTE — TOC Progression Note (Signed)
Transition of Care [facility]) - Progression Note    Patient Details  Name: Amy [name] MRN: [id number] Date of Birth: 01-17-1936  Transition of Care [facility]) CM/SW Contact  Ladiamond Gallina A [place], [place] Phone Number: 06/27/2023, 10:36 AM  Clinical Narrative:     CSW followed up with [facility], bed available for tomorrow.   Insurance authorization started for facility. Status approved.   Approved 8/20 - 8/22  NRD 8/22  Auth ID [id number]   Provider notified.   TOC will continue to follow.  Expected Discharge Plan: Skilled Nursing Facility Barriers to Discharge: Continued Medical Work up, SNF Pending bed offer, English as a second language teacher  Expected Discharge Plan and Services In-house Referral: Clinical Social Work     Living arrangements for the past 2 months: Single Family Home                                       Social Determinants of Health (SDOH) Interventions SDOH Screenings   Food Insecurity: No Food Insecurity (06/17/2023)  Housing: Low Risk  (06/17/2023)  Transportation Needs: No Transportation Needs (06/17/2023)  Utilities: Not At Risk (06/17/2023)  Tobacco Use: Medium Risk (06/21/2023)    Readmission Risk Interventions     No data to display

## 2023-06-28 ENCOUNTER — Emergency Department (HOSPITAL_COMMUNITY): Payer: Medicare HMO

## 2023-06-28 ENCOUNTER — Encounter (HOSPITAL_COMMUNITY): Payer: Self-pay

## 2023-06-28 ENCOUNTER — Inpatient Hospital Stay (HOSPITAL_COMMUNITY)
Admission: EM | Admit: 2023-06-28 | Discharge: 2023-07-09 | DRG: 291 | Disposition: E | Payer: Medicare HMO | Source: Skilled Nursing Facility | Attending: Internal Medicine | Admitting: Internal Medicine

## 2023-06-28 ENCOUNTER — Other Ambulatory Visit: Payer: Self-pay

## 2023-06-28 DIAGNOSIS — R627 Adult failure to thrive: Secondary | ICD-10-CM | POA: Diagnosis present

## 2023-06-28 DIAGNOSIS — Z886 Allergy status to analgesic agent status: Secondary | ICD-10-CM

## 2023-06-28 DIAGNOSIS — D500 Iron deficiency anemia secondary to blood loss (chronic): Secondary | ICD-10-CM | POA: Diagnosis not present

## 2023-06-28 DIAGNOSIS — M1990 Unspecified osteoarthritis, unspecified site: Secondary | ICD-10-CM | POA: Diagnosis present

## 2023-06-28 DIAGNOSIS — R001 Bradycardia, unspecified: Secondary | ICD-10-CM | POA: Diagnosis not present

## 2023-06-28 DIAGNOSIS — R680 Hypothermia, not associated with low environmental temperature: Secondary | ICD-10-CM | POA: Diagnosis present

## 2023-06-28 DIAGNOSIS — E871 Hypo-osmolality and hyponatremia: Secondary | ICD-10-CM | POA: Diagnosis present

## 2023-06-28 DIAGNOSIS — Z87891 Personal history of nicotine dependence: Secondary | ICD-10-CM | POA: Diagnosis not present

## 2023-06-28 DIAGNOSIS — I130 Hypertensive heart and chronic kidney disease with heart failure and stage 1 through stage 4 chronic kidney disease, or unspecified chronic kidney disease: Secondary | ICD-10-CM | POA: Diagnosis present

## 2023-06-28 DIAGNOSIS — I5033 Acute on chronic diastolic (congestive) heart failure: Secondary | ICD-10-CM | POA: Diagnosis present

## 2023-06-28 DIAGNOSIS — N179 Acute kidney failure, unspecified: Secondary | ICD-10-CM | POA: Diagnosis present

## 2023-06-28 DIAGNOSIS — I959 Hypotension, unspecified: Secondary | ICD-10-CM | POA: Diagnosis not present

## 2023-06-28 DIAGNOSIS — N1330 Unspecified hydronephrosis: Secondary | ICD-10-CM | POA: Diagnosis present

## 2023-06-28 DIAGNOSIS — E538 Deficiency of other specified B group vitamins: Secondary | ICD-10-CM | POA: Diagnosis present

## 2023-06-28 DIAGNOSIS — I10 Essential (primary) hypertension: Secondary | ICD-10-CM | POA: Diagnosis not present

## 2023-06-28 DIAGNOSIS — D539 Nutritional anemia, unspecified: Secondary | ICD-10-CM | POA: Diagnosis present

## 2023-06-28 DIAGNOSIS — L89323 Pressure ulcer of left buttock, stage 3: Secondary | ICD-10-CM | POA: Diagnosis present

## 2023-06-28 DIAGNOSIS — Z885 Allergy status to narcotic agent status: Secondary | ICD-10-CM

## 2023-06-28 DIAGNOSIS — I7143 Infrarenal abdominal aortic aneurysm, without rupture: Secondary | ICD-10-CM | POA: Diagnosis present

## 2023-06-28 DIAGNOSIS — J90 Pleural effusion, not elsewhere classified: Secondary | ICD-10-CM

## 2023-06-28 DIAGNOSIS — I080 Rheumatic disorders of both mitral and aortic valves: Secondary | ICD-10-CM | POA: Diagnosis present

## 2023-06-28 DIAGNOSIS — G8929 Other chronic pain: Secondary | ICD-10-CM | POA: Diagnosis present

## 2023-06-28 DIAGNOSIS — E872 Acidosis, unspecified: Secondary | ICD-10-CM | POA: Diagnosis present

## 2023-06-28 DIAGNOSIS — R0902 Hypoxemia: Secondary | ICD-10-CM

## 2023-06-28 DIAGNOSIS — Z809 Family history of malignant neoplasm, unspecified: Secondary | ICD-10-CM

## 2023-06-28 DIAGNOSIS — J189 Pneumonia, unspecified organism: Secondary | ICD-10-CM

## 2023-06-28 DIAGNOSIS — J9601 Acute respiratory failure with hypoxia: Secondary | ICD-10-CM | POA: Diagnosis present

## 2023-06-28 DIAGNOSIS — N184 Chronic kidney disease, stage 4 (severe): Secondary | ICD-10-CM | POA: Diagnosis present

## 2023-06-28 DIAGNOSIS — R9431 Abnormal electrocardiogram [ECG] [EKG]: Secondary | ICD-10-CM | POA: Diagnosis not present

## 2023-06-28 DIAGNOSIS — E7800 Pure hypercholesterolemia, unspecified: Secondary | ICD-10-CM | POA: Diagnosis present

## 2023-06-28 DIAGNOSIS — D649 Anemia, unspecified: Secondary | ICD-10-CM | POA: Diagnosis present

## 2023-06-28 DIAGNOSIS — Z79899 Other long term (current) drug therapy: Secondary | ICD-10-CM

## 2023-06-28 DIAGNOSIS — R918 Other nonspecific abnormal finding of lung field: Secondary | ICD-10-CM | POA: Diagnosis not present

## 2023-06-28 DIAGNOSIS — N189 Chronic kidney disease, unspecified: Secondary | ICD-10-CM | POA: Diagnosis not present

## 2023-06-28 DIAGNOSIS — I2510 Atherosclerotic heart disease of native coronary artery without angina pectoris: Secondary | ICD-10-CM | POA: Diagnosis present

## 2023-06-28 DIAGNOSIS — R54 Age-related physical debility: Secondary | ICD-10-CM | POA: Diagnosis present

## 2023-06-28 DIAGNOSIS — T68XXXA Hypothermia, initial encounter: Secondary | ICD-10-CM

## 2023-06-28 DIAGNOSIS — J9811 Atelectasis: Secondary | ICD-10-CM | POA: Diagnosis not present

## 2023-06-28 DIAGNOSIS — Z882 Allergy status to sulfonamides status: Secondary | ICD-10-CM

## 2023-06-28 DIAGNOSIS — R0689 Other abnormalities of breathing: Secondary | ICD-10-CM | POA: Diagnosis not present

## 2023-06-28 DIAGNOSIS — Z833 Family history of diabetes mellitus: Secondary | ICD-10-CM

## 2023-06-28 DIAGNOSIS — Z9049 Acquired absence of other specified parts of digestive tract: Secondary | ICD-10-CM

## 2023-06-28 DIAGNOSIS — E162 Hypoglycemia, unspecified: Secondary | ICD-10-CM | POA: Diagnosis not present

## 2023-06-28 DIAGNOSIS — K5730 Diverticulosis of large intestine without perforation or abscess without bleeding: Secondary | ICD-10-CM | POA: Diagnosis not present

## 2023-06-28 DIAGNOSIS — Z66 Do not resuscitate: Secondary | ICD-10-CM | POA: Diagnosis present

## 2023-06-28 DIAGNOSIS — Z7982 Long term (current) use of aspirin: Secondary | ICD-10-CM | POA: Diagnosis not present

## 2023-06-28 DIAGNOSIS — R0602 Shortness of breath: Secondary | ICD-10-CM | POA: Diagnosis present

## 2023-06-28 DIAGNOSIS — R935 Abnormal findings on diagnostic imaging of other abdominal regions, including retroperitoneum: Secondary | ICD-10-CM | POA: Diagnosis not present

## 2023-06-28 LAB — PROTIME-INR
INR: 1 (ref 0.8–1.2)
Prothrombin Time: 13.4 seconds (ref 11.4–15.2)

## 2023-06-28 LAB — I-STAT VENOUS BLOOD GAS, ED
Acid-base deficit: 9 mmol/L — ABNORMAL HIGH (ref 0.0–2.0)
Bicarbonate: 18.4 mmol/L — ABNORMAL LOW (ref 20.0–28.0)
Calcium, Ion: 1.15 mmol/L (ref 1.15–1.40)
HCT: 27 % — ABNORMAL LOW (ref 36.0–46.0)
Hemoglobin: 9.2 g/dL — ABNORMAL LOW (ref 12.0–15.0)
O2 Saturation: 51 %
Potassium: 4.9 mmol/L (ref 3.5–5.1)
Sodium: 135 mmol/L (ref 135–145)
TCO2: 20 mmol/L — ABNORMAL LOW (ref 22–32)
pCO2, Ven: 47.9 mmHg (ref 44–60)
pH, Ven: 7.193 — CL (ref 7.25–7.43)
pO2, Ven: 34 mmHg (ref 32–45)

## 2023-06-28 LAB — COMPREHENSIVE METABOLIC PANEL
ALT: 6 U/L (ref 0–44)
AST: 16 U/L (ref 15–41)
Albumin: 2.1 g/dL — ABNORMAL LOW (ref 3.5–5.0)
Alkaline Phosphatase: 55 U/L (ref 38–126)
Anion gap: 10 (ref 5–15)
BUN: 42 mg/dL — ABNORMAL HIGH (ref 8–23)
CO2: 17 mmol/L — ABNORMAL LOW (ref 22–32)
Calcium: 8.3 mg/dL — ABNORMAL LOW (ref 8.9–10.3)
Chloride: 107 mmol/L (ref 98–111)
Creatinine, Ser: 2.5 mg/dL — ABNORMAL HIGH (ref 0.44–1.00)
GFR, Estimated: 18 mL/min — ABNORMAL LOW (ref >60)
Glucose, Bld: 113 mg/dL — ABNORMAL HIGH (ref 70–99)
Potassium: 5.1 mmol/L (ref 3.5–5.1)
Sodium: 134 mmol/L — ABNORMAL LOW (ref 135–145)
Total Bilirubin: 0.3 mg/dL (ref 0.3–1.2)
Total Protein: 5.6 g/dL — ABNORMAL LOW (ref 6.5–8.1)

## 2023-06-28 LAB — CBC WITH DIFFERENTIAL/PLATELET
Abs Immature Granulocytes: 0.2 10*3/uL — ABNORMAL HIGH (ref 0.00–0.07)
Basophils Absolute: 0 10*3/uL (ref 0.0–0.1)
Basophils Relative: 0 %
Eosinophils Absolute: 0.1 10*3/uL (ref 0.0–0.5)
Eosinophils Relative: 1 %
HCT: 30.6 % — ABNORMAL LOW (ref 36.0–46.0)
Hemoglobin: 9 g/dL — ABNORMAL LOW (ref 12.0–15.0)
Immature Granulocytes: 1 %
Lymphocytes Relative: 7 %
Lymphs Abs: 1.1 10*3/uL (ref 0.7–4.0)
MCH: 30.2 pg (ref 26.0–34.0)
MCHC: 29.4 g/dL — ABNORMAL LOW (ref 30.0–36.0)
MCV: 102.7 fL — ABNORMAL HIGH (ref 80.0–100.0)
Monocytes Absolute: 0.8 10*3/uL (ref 0.1–1.0)
Monocytes Relative: 5 %
Neutro Abs: 12.9 10*3/uL — ABNORMAL HIGH (ref 1.7–7.7)
Neutrophils Relative %: 86 %
Platelets: 206 10*3/uL (ref 150–400)
RBC: 2.98 MIL/uL — ABNORMAL LOW (ref 3.87–5.11)
RDW: 15 % (ref 11.5–15.5)
WBC: 15.1 10*3/uL — ABNORMAL HIGH (ref 4.0–10.5)
nRBC: 0 % (ref 0.0–0.2)

## 2023-06-28 LAB — BRAIN NATRIURETIC PEPTIDE: B Natriuretic Peptide: 2041.7 pg/mL — ABNORMAL HIGH (ref 0.0–100.0)

## 2023-06-28 LAB — CBC
HCT: 28.6 % — ABNORMAL LOW (ref 36.0–46.0)
Hemoglobin: 8.6 g/dL — ABNORMAL LOW (ref 12.0–15.0)
MCH: 29.9 pg (ref 26.0–34.0)
MCHC: 30.1 g/dL (ref 30.0–36.0)
MCV: 99.3 fL (ref 80.0–100.0)
Platelets: 169 10*3/uL (ref 150–400)
RBC: 2.88 MIL/uL — ABNORMAL LOW (ref 3.87–5.11)
RDW: 15.1 % (ref 11.5–15.5)
WBC: 10.7 10*3/uL — ABNORMAL HIGH (ref 4.0–10.5)
nRBC: 0 % (ref 0.0–0.2)

## 2023-06-28 LAB — I-STAT CG4 LACTIC ACID, ED: Lactic Acid, Venous: 0.9 mmol/L (ref 0.5–1.9)

## 2023-06-28 LAB — BASIC METABOLIC PANEL
Anion gap: 10 (ref 5–15)
BUN: 42 mg/dL — ABNORMAL HIGH (ref 8–23)
CO2: 17 mmol/L — ABNORMAL LOW (ref 22–32)
Calcium: 7.9 mg/dL — ABNORMAL LOW (ref 8.9–10.3)
Chloride: 106 mmol/L (ref 98–111)
Creatinine, Ser: 2.41 mg/dL — ABNORMAL HIGH (ref 0.44–1.00)
GFR, Estimated: 19 mL/min — ABNORMAL LOW (ref >60)
Glucose, Bld: 89 mg/dL (ref 70–99)
Potassium: 4.6 mmol/L (ref 3.5–5.1)
Sodium: 133 mmol/L — ABNORMAL LOW (ref 135–145)

## 2023-06-28 LAB — TROPONIN I (HIGH SENSITIVITY)
Troponin I (High Sensitivity): 34 ng/L — ABNORMAL HIGH (ref <18)
Troponin I (High Sensitivity): 32 ng/L — ABNORMAL HIGH

## 2023-06-28 LAB — MAGNESIUM: Magnesium: 1.9 mg/dL (ref 1.7–2.4)

## 2023-06-28 LAB — MRSA NEXT GEN BY PCR, NASAL: MRSA by PCR Next Gen: NOT DETECTED

## 2023-06-28 MED ORDER — SODIUM CHLORIDE 0.9 % IV SOLN
2.0000 g | Freq: Once | INTRAVENOUS | Status: AC
  Administered 2023-06-28: 2 g via INTRAVENOUS
  Filled 2023-06-28: qty 12.5

## 2023-06-28 MED ORDER — SENNOSIDES-DOCUSATE SODIUM 8.6-50 MG PO TABS: 1.0000 | ORAL_TABLET | Freq: Two times a day (BID) | ORAL | Status: DC

## 2023-06-28 MED ORDER — IOHEXOL 350 MG/ML SOLN
60.0000 mL | Freq: Once | INTRAVENOUS | Status: AC | PRN
  Administered 2023-06-28: 60 mL via INTRAVENOUS

## 2023-06-28 MED ORDER — ATENOLOL 50 MG PO TABS: 25.0000 mg | ORAL_TABLET | Freq: Every day | ORAL | Status: DC

## 2023-06-28 MED ORDER — VANCOMYCIN HCL 1500 MG/300ML IV SOLN
1500.0000 mg | Freq: Once | INTRAVENOUS | Status: AC
  Administered 2023-06-28: 1500 mg via INTRAVENOUS
  Filled 2023-06-28: qty 300

## 2023-06-28 MED ORDER — FOLIC ACID 1 MG PO TABS: 1.0000 mg | ORAL_TABLET | Freq: Every day | ORAL | Status: DC

## 2023-06-28 MED ORDER — ENSURE ENLIVE PO LIQD: 237.0000 mL | Freq: Three times a day (TID) | ORAL | Status: DC

## 2023-06-28 MED ORDER — POLYETHYLENE GLYCOL 3350 17 G PO PACK: 17.0000 g | PACK | Freq: Every day | ORAL | Status: DC

## 2023-06-28 MED ORDER — PANTOPRAZOLE SODIUM 40 MG PO TBEC: 40.0000 mg | DELAYED_RELEASE_TABLET | Freq: Two times a day (BID) | ORAL | Status: DC

## 2023-06-28 MED ORDER — BISACODYL 10 MG RE SUPP: 10.0000 mg | Freq: Every day | RECTAL | Status: DC | PRN

## 2023-06-28 MED ORDER — ACETAMINOPHEN 325 MG PO TABS: 650.0000 mg | ORAL_TABLET | Freq: Four times a day (QID) | ORAL | Status: DC | PRN

## 2023-06-28 MED ORDER — ASPIRIN 81 MG PO TBEC: 81.0000 mg | DELAYED_RELEASE_TABLET | Freq: Every day | ORAL | Status: DC

## 2023-06-28 NOTE — TOC Transition Note (Addendum)
Transition of Care [facility]) - CM/SW Discharge Note   Patient Details  Name: Amy [name] MRN: [id number] Date of Birth: [birth year]  Transition of Care [facility]) CM/SW Contact:  Libero Puthoff A [place], LCSWA Phone Number: 06/28/2023, 3:58 PM   Clinical Narrative:     Patient will DC to: [facility]  Anticipated DC date: 06/28/23  Family notified: Audley Hose  Transport by: Family Transport  Reference ID [id number] Auth ID [id number]  Approval Dates:  06/27/2023-06/29/2023    Per MD patient ready for DC to [facility] . RN, patient, patient's family, and facility notified of DC. Discharge Summary and FL2 sent to facility. RN to call report prior to discharge ( 605p, [phone]). DC packet on chart. Ambulance transport requested for patient.     CSW will sign off for now as social work intervention is no longer needed. Please consult [place] again if new needs arise.   Final next level of care: Skilled Nursing Facility Barriers to Discharge: Barriers Resolved   Patient Goals and CMS Choice      Discharge Placement                Patient chooses bed at: [facility] Patient to be transferred to facility by: Family transport Name of family member notified: Audley Hose Patient and family notified of of transfer: 06/28/23  Discharge Plan and Services Additional resources added to the After Visit Summary for   In-house Referral: Clinical Social Work                                   Social Determinants of Health (SDOH) Interventions SDOH Screenings   Food Insecurity: No Food Insecurity (06/17/2023)  Housing: Low Risk  (06/17/2023)  Transportation Needs: No Transportation Needs (06/17/2023)  Utilities: Not At Risk (06/17/2023)  Tobacco Use: Medium Risk (06/21/2023)     Readmission Risk Interventions     No data to display

## 2023-06-28 NOTE — Progress Notes (Addendum)
Patient ID: Amy [name], female   DOB: [birth year], 87 y.o.   MRN: [id number] [facility] Surgery Progress Note  7 Days Post-Op  Subjective: CC-  No complaints. Going to SNF today.  Objective: Vital signs in last 24 hours: Temp:  [95.6 F (35.3 C)-97.7 F (36.5 C)] 97.7 F (36.5 C) (08/21 0808) Pulse Rate:  [59-67] 65 (08/21 0808) Resp:  [17-18] 17 (08/21 0808) BP: (131-156)/(50-60) 131/50 (08/21 0808) SpO2:  [93 %-96 %] 96 % (08/21 0808) Last BM Date : 06/27/23  Intake/Output from previous day: 08/20 0701 - 08/21 0700 In: 1136 [I.V.:1136] Out: 70 [Drains:70] Intake/Output this shift: No intake/output data recorded.  PE: Gen:  Alert, NAD Abd: soft, nondistended, nontender, lap incisions cdi with dermabond present and no erythema or drainage, JP serous  Lab Results:  Recent Labs    06/26/23 0315 06/28/23 0332  WBC 12.1* 10.7*  HGB 9.2* 8.6*  HCT 29.5* 28.6*  PLT 197 169   BMET Recent Labs    06/26/23 0315 06/28/23 0332  NA 134* 133*  K 4.7 4.6  CL 104 106  CO2 23 17*  GLUCOSE 93 89  BUN 44* 42*  CREATININE 2.48* 2.41*  CALCIUM 8.0* 7.9*   PT/INR No results for input(s): "LABPROT", "INR" in the last 72 hours. CMP     Component Value Date/Time   NA 133 (L) 06/28/2023 0332   NA 136 04/11/2017 1426   K 4.6 06/28/2023 0332   K 4.8 04/11/2017 1426   CL 106 06/28/2023 0332   CO2 17 (L) 06/28/2023 0332   CO2 22 04/11/2017 1426   GLUCOSE 89 06/28/2023 0332   GLUCOSE 105 04/11/2017 1426   BUN 42 (H) 06/28/2023 0332   BUN 33.9 (H) 04/11/2017 1426   CREATININE 2.41 (H) 06/28/2023 0332   CREATININE 1.9 (H) 04/11/2017 1426   CALCIUM 7.9 (L) 06/28/2023 0332   CALCIUM 9.2 04/11/2017 1426   PROT 4.8 (L) 06/22/2023 0157   PROT 7.1 04/11/2017 1426   PROT 6.5 04/11/2017 1426   ALBUMIN 2.0 (L) 06/22/2023 0157   ALBUMIN 2.7 (L) 04/11/2017 1426   AST 59 (H) 06/22/2023 0157   AST 16 04/11/2017 1426   ALT 38 06/22/2023 0157   ALT 16 04/11/2017 1426    ALKPHOS 39 06/22/2023 0157   ALKPHOS 107 04/11/2017 1426   BILITOT 0.4 06/22/2023 0157   BILITOT 0.22 04/11/2017 1426   GFRNONAA 19 (L) 06/28/2023 0332   GFRAA 37 (L) 02/15/2017 0340   Lipase     Component Value Date/Time   LIPASE 70 (H) 06/22/2023 0157       Studies/Results: No results found.  Anti-infectives: Anti-infectives (From admission, onward)    Start     Dose/Rate Route Frequency Ordered Stop   06/21/23 1059  ceFAZolin (ANCEF) 2-4 GM/100ML-% IVPB       Note to Pharmacy: Marijo Sanes B: cabinet override      06/21/23 1059 06/21/23 2314   06/21/23 0600  ceFAZolin (ANCEF) IVPB 2g/100 mL premix        2 g 200 mL/hr over 30 Minutes Intravenous On call to O.R. 06/20/23 1307 06/21/23 1027   06/19/23 1215  ampicillin-sulbactam (UNASYN) 1.5 g in sodium chloride 0.9 % 100 mL IVPB  Status:  Discontinued        1.5 g 200 mL/hr over 30 Minutes Intravenous  Once 06/19/23 1214 06/19/23 1547   06/17/23 1400  fosfomycin (MONUROL) packet 3 g        3 g Oral  Once 06/17/23 1300 06/17/23 1720   06/16/23 2315  cefTRIAXone (ROCEPHIN) 1 g in sodium chloride 0.9 % 100 mL IVPB        1 g 200 mL/hr over 30 Minutes Intravenous  Once 06/16/23 2313 06/17/23 0509        Assessment/Plan Choledocholithiasis - s/p ERCP 8/12: Choledocholithiasis s/p biliary sphincterotomy, sphincteroplasty and stone extraction; nonfilling of the cystic duct/ gallbladder -cleared by cardiology, appreciate evaluation -POD#7 s/p lap chole 8/14 with lysis of adhesions. Chronic cholecystitis and dense scarring of posterior liver/GB to retroperitoneum from prior open adrenalectomy.  - Doing well from surgery. Plans to d/c to SNF likely today pending medical clearance. Ok for discharge from surgical standpoint. Will d/c with drain and follow up in our office around 10 days postop for drain removal.   ID - none currently FEN - regular diet, bowel regimen VTE - SCDs, per primary Foley - none    LOS: 11 days     Franne Forts, [facility] 06/28/2023, 9:47 AM Please see Amion for pager number during day hours 7:00am-4:30pm

## 2023-06-28 NOTE — Progress Notes (Signed)
Patient satting 100% on NRB.  RT titrated O2 down to 3L [place] patients sats 100%.  RT will continue to monitor.

## 2023-06-28 NOTE — Plan of Care (Signed)

## 2023-06-28 NOTE — Discharge Summary (Signed)
Physician Discharge Summary  Amy [name] BJY:[id number] DOB: 1935/11/12  PCP: Geoffry Paradise, MD  Admitted from: Home Discharged to: SNF  Admit date: 06/16/2023 Discharge date: 06/28/2023  Recommendations for Outpatient Follow-up:    Follow-up Information     Maczis, Hedda Slade, PA-C. Go on 07/13/2023.   Specialty: General Surgery Why: Your appointment is 07/13/23 at 1:45 pm Please arrive 15 minutes early for check in Contact information: [address] [place] [address] [phone]         [facility], [place]. Go on 06/26/2023.   Specialty: General Surgery Why: Your appointment is 8/23 at 9am for drain check/removal Arrive early to check in, fill out paperwork, Bring photo ID and insurance information Contact information: [address] [place] [address] [phone]        MD at SNF Follow up in 3 day(s).   Why: To be seen with repeat labs (CBC & CMP)        Geoffry Paradise, MD. Schedule an appointment as soon as possible for a visit.   Specialty: Internal Medicine Why: To be seen pending discharge from SNF. Contact information: [address] [place] [address] [phone]         Lynann Bologna, MD. Schedule an appointment as soon as possible for a visit.   Specialties: Gastroenterology, Internal Medicine Contact information: [address] [place]. [place] [address] [phone]                  Home Health: None    Equipment/Devices: TBD at SNF    Discharge Condition: Improved and stable.   Code Status: DNR Diet recommendation:  Discharge Diet Orders (From admission, onward)     Start     Ordered   06/28/23 0000  Diet - low sodium heart healthy        06/28/23 1246             Discharge Diagnoses:  Principal Problem:   Acute kidney injury (HCC) Active Problems:   AKI (acute kidney injury) (HCC)   Essential hypertension   Hyperkalemia   Hyponatremia    Metabolic acidosis   Gastritis   Heme positive stool   Cholelithiasis with choledocholithiasis   Loss of weight   Acute gastric ulcer   Brief Summary: Amy [name] is a 87 y.o. female with past medical history significant for HTN, GERD, iron deficiency anemia, CKD stage IV, PUD, who presented to [facility] ED on 8/9 by direction of her outpatient PCP for concerns of worsening kidney function.  Patient with elevated creatinine of 3.5; in which she had been on HCTZ which has been held.  Patient endorses nausea/vomiting, abdominal pain localized in the epigastric area.  Also associated with progressive weakness.   In the ED, temperature 97.8 F, HR 65, RR 18, BP 143/46, SpO2 100% on room air.  WBC 13.7, hemoglobin 10.9, platelets 222.  Sodium 124, potassium 5.5, chloride 97, CO2 16, glucose 123, BUN 59, creatinine 3.45.  AST 15, ALT 14, total bilirubin 0.6.  Urinalysis with large leukocytes, negative nitrite, few bacteria, greater than 50 WBCs.  FOBT positive.  CT abdomen/pelvis without contrast with persistent cholelithiasis and persistent choledocholithiasis with slightly increased size, bile duct dilation, new CT finding of acute cholecystitis, multilevel severe degenerative changes spine with impacted/fused L4-5 levels and associated grade 2 anterolisthesis L4 on L5, trace hiatal hernia.  Patient received 1 L NS bolus.  GI was consulted.  TRH consulted for admission for further evaluation and management.  Patient was transferred to [facility].   Assessment & Plan:   Acute renal failure on CKD stage IV Nonanion gap metabolic acidosis, Hyperkalemia: Resolved Patient staying with a creatinine of 3.45.  Review of EMR notable for baseline creatinine 1.9 in 2018.  No other results noted in care everywhere either.  It is likely that patient's CKD progressed since then to now. -- Cr 3.45>>2.86>2.74>2.36>2.54>2.47>2.17>2.48 >2.41 -- serum CO2 16 on admission had normalized but  intermittently low including 17 today, unclear reason despite creatinine continues to downtrend. -- Discontinued Olmesartan at discharge. -- BMP to be followed closely as outpatient may be twice a week. -- Would benefit from outpatient Nephrology follow-up.  This can be coordinated by MD at SNF or her PCP.   Upper GI bleed secondary to peptic ulcer Patient presenting with a hemoglobin of 10.9, baseline 13.  FOBT positive.  History of peptic ulcer disease.  Patient underwent ERCP on 06/19/2023 with findings of 8 mm ulcer prepyloric area without active bleeding, Stomach biopsy showed gastric antral mucosa with mild chronic active gastritis.  No H. pylori identified.  Negative for intestinal metaplasia or dysplasia. --  Anemia panel with iron 62, TIBC 220, ferritin 28, folate 4.8, vitamin B12 459. -- Hgb 10.9>>8.6>9.3>8.9>8.7>9.2 >8.6 -- Folic acid 1 mg p.o. daily.  Continue multivitamins. -- Protonix 40 mg p.o. twice daily x 8 weeks followed by once daily -- Closely follow CBC as outpatient twice weekly -Outpatient follow-up with [facility] who may want to consider repeat EGD to ensure healing of gastric ulcer.   Choledocholithiasis Chronic cholelithiasis CT abdomen/pelvis with persistent cholelithiasis/choledocholithiasis with slightly increased size of bile duct.  GI was consulted and patient underwent ERCP 8/12 with findings significant for CBD dilated to 15 mm with multiple filling defects s/p sphincterotomy and sweeping of the biliary tree with extraction of stones, nonfilling of the cystic duct/gallbladder.  General surgery was consulted and patient underwent laparoscopic cholecystectomy with lysis of adhesions by Dr. Fredricka Bonine on 06/21/2023.  -- Drain output 70mL past 24h -- Regular diet tolerating same but eating small amounts.  Last BM reported yesterday. -General Surgery has cleared her for discharge to SNF but recommend that she be discharged with the RUQ percutaneous drain which will be  reviewed and the surgical office visit regarding discontinuing.   Folic acid deficiency Folic acid low at 4.8. -- Folic acid 1 mg p.o. daily   Essential hypertension -- Amlodipine 10 mg p.o. daily -- Atenolol 25 mg p.o. daily (dose reduced) --Discontinued home olmesartan due to AKI as above, and the context of advanced CKD -- Continue to monitor BP.  Reasonably controlled.   Hyperkalemia: Resolved Hypokalemia: Resolved Potassium 5.5 on admission, likely secondary to acute renal failure.  Improved with IV fluid hydration   Hypomagnesemia: Resolved Repleted   Hyponatremia: Resolved Etiology likely secondary to poor oral intake. -- Na 124>>134>136>135>133>134 Mild and stable.   Right foot pain Patient course intermittent pain to her right foot.  X-ray with no acute findings. Resting right and left ankle-brachial index indicates noncompressible right lower extremity arteries with abnormal toe brachial index. -- Recommend outpatient follow-up with vascular surgery. -Unclear as to why she was on aspirin 81 Mg daily.  In the absence of overt bleeding, resumed aspirin but changed to enteric-coated.   Weakness/debility/deconditioning: Patient ambulates with the use of a walker/cane at baseline. -- PT/OT evaluation: SNF -- TOC for placement  Consultants:  [facility]  Procedures:  ERCP 8/12 Laparoscopic cholecystectomy with LOA 8/14, Dr. Fredricka Bonine  Discharge Instructions  Discharge Instructions     Call MD for:  difficulty breathing, headache or visual disturbances   Complete by: As directed    Call MD for:  extreme fatigue   Complete by: As directed    Call MD for:  persistant dizziness or light-headedness   Complete by: As directed    Call MD for:  persistant nausea and vomiting   Complete by: As directed    Call MD for:  redness, tenderness, or signs of infection (pain, swelling, redness, odor or green/yellow discharge around incision site)   Complete by:  As directed    Call MD for:  severe uncontrolled pain   Complete by: As directed    Call MD for:  temperature >100.4   Complete by: As directed    Diet - low sodium heart healthy   Complete by: As directed    Discharge instructions   Complete by: As directed    Patient is discharging from hospital to SNF with postop RUQ drain.  Drain care as per surgical team instructions in AVS.  They will reassess regarding drain removal during outpatient office visit.   Increase activity slowly   Complete by: As directed         Medication List     STOP taking these medications    aspirin 81 MG tablet Replaced by: aspirin EC 81 MG tablet   famotidine 20 MG tablet Commonly known as: PEPCID   HYDROcodone-acetaminophen 5-325 MG tablet Commonly known as: NORCO/VICODIN   olmesartan 20 MG tablet Commonly known as: BENICAR       TAKE these medications    acetaminophen 325 MG tablet Commonly known as: TYLENOL Take 2 tablets (650 mg total) by mouth every 6 (six) hours as needed for mild pain, moderate pain or fever.   amLODipine 10 MG tablet Commonly known as: NORVASC Take 10 mg by mouth daily.   aspirin EC 81 MG tablet Take 1 tablet (81 mg total) by mouth daily. Replaces: aspirin 81 MG tablet   atenolol 50 MG tablet Commonly known as: TENORMIN Take 0.5 tablets (25 mg total) by mouth daily. What changed: how much to take   bisacodyl 10 MG suppository Commonly known as: DULCOLAX Place 1 suppository (10 mg total) rectally daily as needed for moderate constipation.   CALCIUM-VITAMIN D PO Take 1 tablet by mouth daily.   cholecalciferol 10 MCG (400 UNIT) Tabs tablet Commonly known as: VITAMIN D3 Take 400 Units by mouth daily.   cyanocobalamin 1000 MCG tablet Commonly known as: VITAMIN B12 Take 1,000 mcg by mouth daily.   cyclobenzaprine 10 MG tablet Commonly known as: FLEXERIL Take 10 mg by mouth daily as needed for muscle spasms.   diclofenac sodium 1 % Gel Commonly  known as: VOLTAREN Apply 2 g topically daily as needed (pain).   feeding supplement Liqd Take 237 mLs by mouth 3 (three) times daily between meals for 10 days.   fluticasone 50 MCG/ACT nasal spray Commonly known as: FLONASE Place 2 sprays into both nostrils at bedtime.   folic acid 1 MG tablet Commonly known as: FOLVITE Take 1 tablet (1 mg total) by mouth daily. Start taking on: June 29, 2023   loratadine 10 MG tablet Commonly known as: CLARITIN Take 10 mg by mouth daily.   meclizine 25 MG tablet Commonly known as: ANTIVERT Take 25 mg by mouth 3 (three) times daily as needed for dizziness.  multivitamin with minerals Tabs tablet Take 1 tablet by mouth daily.   nitroGLYCERIN 0.4 MG SL tablet Commonly known as: NITROSTAT Place 0.4 mg under the tongue every 5 (five) minutes as needed for chest pain.   pantoprazole 40 MG tablet Commonly known as: PROTONIX Take 1 tablet (40 mg total) by mouth 2 (two) times daily before a meal. Take 1 tab (40 mg total) twice daily x 7 weeks followed by 1 tab (40 mg total) daily thereafter. What changed:  when to take this additional instructions   polyethylene glycol 17 g packet Commonly known as: MIRALAX / GLYCOLAX Take 17 g by mouth daily. Start taking on: June 29, 2023   senna-docusate 8.6-50 MG tablet Commonly known as: Senokot-S Take 1 tablet by mouth 2 (two) times daily.   vitamin E 180 MG (400 UNITS) capsule Take 400 Units by mouth daily.       Allergies  Allergen Reactions   Codeine Nausea And Vomiting    REACTION: nausea/vomiting   Oxaprozin     Other Reaction(s): Unknown   Sulfonamide Derivatives Itching and Rash      Procedures/Studies: DG Abd 1 View  Result Date: 06/24/2023 CLINICAL DATA:  Abdominal pain EXAM: ABDOMEN - 1 VIEW COMPARISON:  CT abdomen 06/16/2023 FINDINGS: Focal increased opacity limiting evaluation overlying the lower lumbar spine and sacrum likely secondary to technique. Gaseous distension  of the colon. Right upper quadrant drain. Surgical clips in the right upper quadrant. No bowel dilatation to suggest obstruction. No evidence of pneumoperitoneum, portal venous gas or pneumatosis. No pathologic calcifications along the expected course of the ureters. No acute osseous abnormality. Lower lumbar spine spondylosis. IMPRESSION: 1. Gaseous distension of the colon. No evidence of bowel obstruction. Electronically Signed   By: Elige Ko M.D.   On: 06/24/2023 10:37   VAS [place] ABI WITH/WO TBI  Result Date: 06/20/2023  LOWER EXTREMITY DOPPLER STUDY Patient Name:  SIOBHAIN ISLAND  Date of Exam:   06/20/2023 Medical Rec #: [id number]       Accession #:    [id number] Date of Birth: 25-Jan-1936       Patient Gender: F Patient Age:   70 years Exam Location:  [facility] Procedure:      VAS [place] ABI WITH/WO TBI Referring Phys: Calvert Cantor --------------------------------------------------------------------------------  Indications: Rest pain. High Risk Factors: Hypertension, past history of smoking.  Comparison Study: No previous study. Performing Technologist: McKayla Maag RVT, [place]  Examination Guidelines: A complete evaluation includes at minimum, Doppler waveform signals and systolic blood pressure reading at the level of bilateral brachial, anterior tibial, and posterior tibial arteries, when vessel segments are accessible. Bilateral testing is considered an integral part of a complete examination. Photoelectric Plethysmograph (PPG) waveforms and toe systolic pressure readings are included as required and additional duplex testing as needed. Limited examinations for reoccurring indications may be performed as noted.  ABI Findings: +---------+--------------+-----+-------------------+---------------------------+ Right    Rt Pressure   IndexWaveform           Comment                              (mmHg)                                                             +---------+--------------+-----+-------------------+---------------------------+  Brachial 154                biphasic                                       +---------+--------------+-----+-------------------+---------------------------+ PTA      255           1.66 monophasic         Non-compressible            +---------+--------------+-----+-------------------+---------------------------+ DP                          dampened monophasicWaveform obtained with 2D                                                  imaging. No pressure                                                       obtained.                   +---------+--------------+-----+-------------------+---------------------------+ Great Toe39            0.25 Abnormal                                       +---------+--------------+-----+-------------------+---------------------------+ +---------+------------------+-----+----------+----------------+ Left     Lt Pressure (mmHg)IndexWaveform  Comment          +---------+------------------+-----+----------+----------------+ PTA      255               1.66 monophasicNon-compressible +---------+------------------+-----+----------+----------------+ DP       96                0.62 monophasic                 +---------+------------------+-----+----------+----------------+ Great Toe57                0.37 Abnormal                   +---------+------------------+-----+----------+----------------+ +-------+----------------+-----------+------------+------------+ ABI/TBIToday's ABI     Today's TBIPrevious ABIPrevious TBI +-------+----------------+-----------+------------+------------+ Right  Non-compressible0.25                                +-------+----------------+-----------+------------+------------+ Left   Non-compressible0.37                                +-------+----------------+-----------+------------+------------+  Summary: Right:  Resting right ankle-brachial index indicates noncompressible right lower extremity arteries. The right toe-brachial index is abnormal. Left: Resting left ankle-brachial index indicates noncompressible left lower extremity arteries. The left toe-brachial index is abnormal. *See table(s) above for measurements and observations.  Electronically signed by Coral Else MD on 06/20/2023 at 9:06:27 PM.    Final    ECHOCARDIOGRAM COMPLETE  Result Date: 06/20/2023    ECHOCARDIOGRAM REPORT   Patient Name:   KHADAJAH EACHUS Date  of Exam: 06/20/2023 Medical Rec #:  [id number]      Height:       65.0 in Accession #:    [id number]     Weight:       139.6 lb Date of Birth:  [birth year]      BSA:          1.698 m Patient Age:    87 years       BP:           149/58 mmHg Patient Gender: F              HR:           82 bpm. Exam Location:  Inpatient Procedure: 2D Echo, Cardiac Doppler and Color Doppler Indications:    Pre-Op  History:        Patient has no prior history of Echocardiogram examinations.                 CAD; Risk Factors:Hypertension.  Sonographer:    Darlys Gales Referring Phys: Leone Brand IMPRESSIONS  1. Left ventricular ejection fraction, by estimation, is 60 to 65%. The left ventricle has normal function. The left ventricle has no regional wall motion abnormalities. Left ventricular diastolic parameters are indeterminate.  2. Right ventricular systolic function is normal. The right ventricular size is normal.  3. The mitral valve is degenerative. No evidence of mitral valve regurgitation. Mild mitral stenosis. The mean mitral valve gradient is 7.0 mmHg with average heart rate of 82 bpm. Severe mitral annular calcification.  4. The aortic valve was not well visualized. Aortic valve regurgitation is moderate to severe. Aortic valve sclerosis is present, with no evidence of aortic valve stenosis. Aortic regurgitation PHT measures 300 msec. Comparison(s): No prior Echocardiogram. FINDINGS  Left Ventricle: Left  ventricular ejection fraction, by estimation, is 60 to 65%. The left ventricle has normal function. The left ventricle has no regional wall motion abnormalities. The left ventricular internal cavity size was normal in size. There is  no left ventricular hypertrophy. Left ventricular diastolic parameters are indeterminate. Right Ventricle: The right ventricular size is normal. No increase in right ventricular wall thickness. Right ventricular systolic function is normal. Left Atrium: Left atrial size was normal in size. Right Atrium: Right atrial size was normal in size. Pericardium: Trivial pericardial effusion is present. Mitral Valve: 2D Mitral valve area 3.48 cm2. The mitral valve is degenerative in appearance. Severe mitral annular calcification. No evidence of mitral valve regurgitation. Mild mitral valve stenosis. MV peak gradient, 10.2 mmHg. The mean mitral valve gradient is 7.0 mmHg with average heart rate of 82 bpm. Tricuspid Valve: The tricuspid valve is normal in structure. Tricuspid valve regurgitation is not demonstrated. No evidence of tricuspid stenosis. Aortic Valve: The aortic valve was not well visualized. Aortic valve regurgitation is moderate to severe. Aortic regurgitation PHT measures 300 msec. Aortic valve sclerosis is present, with no evidence of aortic valve stenosis. Aortic valve mean gradient  measures 6.0 mmHg. Aortic valve peak gradient measures 10.4 mmHg. Aortic valve area, by VTI measures 2.00 cm. Pulmonic Valve: The pulmonic valve was normal in structure. Pulmonic valve regurgitation is mild. No evidence of pulmonic stenosis. Aorta: The aortic root is normal in size and structure. IAS/Shunts: The interatrial septum was not well visualized.  LEFT VENTRICLE PLAX 2D LVIDd:         4.30 cm   Diastology LVIDs:         3.00 cm   LV e' medial:  5.98 cm/s LV PW:         1.00 cm   LV E/e' medial:  25.4 LV IVS:        0.80 cm   LV e' lateral:   5.66 cm/s LVOT diam:     1.80 cm   LV E/e'  lateral: 26.9 LV SV:         84 LV SV Index:   49 LVOT Area:     2.54 cm  RIGHT VENTRICLE RV S prime:     14.80 cm/s TAPSE (M-mode): 2.2 cm LEFT ATRIUM             Index        RIGHT ATRIUM           Index LA Vol (A2C):   41.8 ml 24.62 ml/m  RA Area:     13.70 cm LA Vol (A4C):   35.7 ml 21.03 ml/m  RA Volume:   30.20 ml  17.79 ml/m LA Biplane Vol: 40.0 ml 23.56 ml/m  AORTIC VALVE AV Area (Vmax):    2.21 cm AV Area (Vmean):   1.96 cm AV Area (VTI):     2.00 cm AV Vmax:           161.00 cm/s AV Vmean:          122.000 cm/s AV VTI:            0.420 m AV Peak Grad:      10.4 mmHg AV Mean Grad:      6.0 mmHg LVOT Vmax:         140.00 cm/s LVOT Vmean:        94.100 cm/s LVOT VTI:          0.330 m LVOT/AV VTI ratio: 0.79 AI PHT:            300 msec  AORTA Ao Root diam: 1.90 cm Ao Asc diam:  2.60 cm MITRAL VALVE                TRICUSPID VALVE MV Area (PHT): 3.42 cm     TR Peak grad:   32.9 mmHg MV Area VTI:   2.14 cm     TR Vmax:        287.00 cm/s MV Peak grad:  10.2 mmHg MV Mean grad:  7.0 mmHg     SHUNTS MV Vmax:       1.60 m/s     Systemic VTI:  0.33 m MV Vmean:      125.3 cm/s   Systemic Diam: 1.80 cm MV Decel Time: 222 msec MV E velocity: 152.00 cm/s MV A velocity: 148.00 cm/s MV E/A ratio:  1.03 Riley Lam MD Electronically signed by Riley Lam MD Signature Date/Time: 06/20/2023/3:59:58 PM    Final    DG Foot Complete Right  Result Date: 06/19/2023 CLINICAL DATA:  Right foot pain EXAM: RIGHT FOOT COMPLETE - 3+ VIEW COMPARISON:  None Available. FINDINGS: Degenerative changes in the interphalangeal joints, first metatarsal-phalangeal joint, and intertarsal joints. No evidence of acute fracture or dislocation. No focal bone lesion or bone destruction. 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 Soft tissues are unremarkable. IMPRESSION: No acute bony abnormalities.  Mild degenerative changes. Electronically Signed   By: Burman Nieves M.D.   On: 06/19/2023 21:47    DG ERCP  Result Date: 06/19/2023 CLINICAL DATA:  ERCP EXAM: ERCP TECHNIQUE: Multiple spot images obtained with the fluoroscopic device and submitted for interpretation post-procedure. FLUOROSCOPY: Refer  to separate report COMPARISON:  None Available. FINDINGS: A total of 13 fluoroscopic spot images obtained during ERCP are submitted for review. Initial images demonstrate a scope overlying the upper abdomen. Surgical clips are present. Wire catheterization and contrast injection of the common bile duct is performed. There appear to be multiple small filling defects best identified on image 7. Balloon sphincterotomy of the ampulla followed by balloon sweeps of the common bile duct appear to be performed. IMPRESSION: ERCP images as described. Refer to dedicated procedure report for full details. These images were submitted for radiologic interpretation only. Please see the procedural report for the amount of contrast and the fluoroscopy time utilized. Electronically Signed   By: Olive Bass M.D.   On: 06/19/2023 15:55   CT ABDOMEN PELVIS WO CONTRAST  Result Date: 06/16/2023 CLINICAL DATA:  Abdominal pain, acute, nonlocalized EXAM: CT ABDOMEN AND PELVIS WITHOUT CONTRAST TECHNIQUE: Multidetector CT imaging of the abdomen and pelvis was performed following the standard protocol without IV contrast. RADIATION DOSE REDUCTION: This exam was performed according to the departmental dose-optimization program which includes automated exposure control, adjustment of the mA and/or kV according to patient size and/or use of iterative reconstruction technique. COMPARISON:  CT abdomen pelvis 08/07/2020, CT abdomen pelvis 02/13/2017 FINDINGS: Lower chest: Trace hiatal hernia.  No acute abnormality. Hepatobiliary: No focal liver abnormality. Calcified gallstone noted within the gallbladder lumen. Persistent several calcified gallstones within the common bile duct with interval increase in size of an enlarged common bile  duct measuring up to 16 mm. No gallbladder wall thickening or pericholecystic fluid. Limited evaluation of the intrahepatic biliary ducts on this noncontrast study. Pancreas: Diffusely atrophic. No focal lesion. Otherwise normal pancreatic contour. No surrounding inflammatory changes. No main pancreatic ductal dilatation. Spleen: Normal in size without focal abnormality.  Splenule noted. Adrenals/Urinary Tract: Status post right adrenalectomy. Coarse calcifications of the left adrenal gland likely sequelae of prior infection/hemorrhage-chronic no further follow-up indicated. No nephrolithiasis and no hydronephrosis. Bilateral renal cortical scarring. Fluid density lesions within the kidneys likely represent simple renal cysts. Chronic 1.1 cm hyperdense right renal lesion with a density of 79 Hounsfield units likely hemorrhagic or proteinaceous cyst-no further follow-up indicated. No ureterolithiasis or hydroureter. The urinary bladder is unremarkable. Stomach/Bowel: Stomach is within normal limits. No evidence of bowel wall thickening or dilatation. Colonic diverticulosis. Appendix appears normal. Vascular/Lymphatic: No abdominal aorta or iliac aneurysm. Severe atherosclerotic plaque of the aorta and its branches. No abdominal, pelvic, or inguinal lymphadenopathy. Reproductive: Status post hysterectomy. No adnexal masses. Other: No intraperitoneal free fluid. No intraperitoneal free gas. No organized fluid collection. Musculoskeletal: No abdominal wall hernia or abnormality. No suspicious lytic or blastic osseous lesions. No acute displaced fracture. 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 Mild retrolisthesis of L1 on L2 and L2 on L3. IMPRESSION: 1. Persistent cholelithiasis and persistent choledocholithiasis with slightly increased in size common bile duct dilatation. No CT finding of acute cholecystitis. Limited  evaluation on this noncontrast study. 2. 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 3. Trace hiatal hernia. Electronically Signed   By: Tish Frederickson M.D.   On: 06/16/2023 22:37      Subjective: Patient denies complaints.  Reported that she ate a small amount this morning, felt slightly nauseous, improved after tilting her head/bed back.  Last BM yesterday.  Denies pain.  No dyspnea.  No  other complaints reported.  Aware that she is discharging to SNF today.  Discharge Exam:  Vitals:   06/27/23 1456 06/27/23 2031 06/28/23 0408 06/28/23 0808  BP: (!) 146/52 (!) 144/52 (!) 156/60 (!) 131/50  Pulse: 67 (!) 59 67 65  Resp:  18 18 17   Temp:   (!) 95.6 F (35.3 C) 97.7 F (36.5 C)  TempSrc:      SpO2: 93% 95%  96%  Weight:      Height:        General: Pt lying comfortably in bed & appears in no obvious distress.  Elderly female, moderately built and nourished.  Oral mucosa moist. Cardiovascular: S1 & S2 heard, RRR, S1/S2 +. No murmurs, rubs, gallops or clicks. No JVD or pedal edema. Respiratory: Clear to auscultation without wheezing, rhonchi or crackles. No increased work of breathing. Abdominal:  Non distended, non tender & soft. No organomegaly or masses appreciated. Normal bowel sounds heard.  Healed laparoscopic incision sites.  RUQ percutaneous drain without acute findings. CNS: Alert and oriented. No focal deficits. Extremities: no edema, no cyanosis    The results of significant diagnostics from this hospitalization (including imaging, microbiology, ancillary and laboratory) are listed below for reference.     Microbiology: Recent Results (from the past 240 hour(s))  Surgical pcr screen     Status: None   Collection Time: 06/20/23 11:05 PM   Specimen: Nasal Mucosa; Nasal Swab  Result Value Ref Range Status   MRSA, PCR NEGATIVE NEGATIVE Final   Staphylococcus aureus NEGATIVE  NEGATIVE Final    Comment: (NOTE) The Xpert SA Assay (FDA approved for NASAL specimens in patients 63 years of age and older), is one component of a comprehensive surveillance program. It is not intended to diagnose infection nor to guide or monitor treatment. Performed at [facility], [address], [place], [place] [address]      Labs: CBC: Recent Labs  Lab 06/22/23 0157 06/25/23 0629 06/26/23 0315 06/28/23 0332  WBC 10.0 12.7* 12.1* 10.7*  HGB 8.7* 8.7* 9.2* 8.6*  HCT 26.1* 27.8* 29.5* 28.6*  MCV 94.2 98.9 99.0 99.3  PLT 179 167 197 169    Basic Metabolic Panel: Recent Labs  Lab 06/22/23 0157 06/23/23 0934 06/24/23 0604 06/25/23 0629 06/26/23 0315 06/28/23 0332  NA 135 132* 133* 134* 134* 133*  K 3.2* 4.0 4.7 4.4 4.7 4.6  CL 99 97* 99 105 104 106  CO2 27 24 26  20* 23 17*  GLUCOSE 120* 151* 105* 83 93 89  BUN 30* 35* 38* 36* 44* 42*  CREATININE 2.36* 2.54* 2.47* 2.17* 2.48* 2.41*  CALCIUM 7.3* 7.4* 8.0* 7.0* 8.0* 7.9*  MG 1.4* 2.6* 2.5*  --   --  1.9    Liver Function Tests: Recent Labs  Lab 06/22/23 0157  AST 59*  ALT 38  ALKPHOS 39  BILITOT 0.4  PROT 4.8*  ALBUMIN 2.0*    CBG: Recent Labs  Lab 06/24/23 0721 06/24/23 1654 06/25/23 0537 06/25/23 0807  GLUCAP 96 92 79 116*     Urinalysis    Component Value Date/Time   COLORURINE YELLOW 06/16/2023 2157   APPEARANCEUR CLOUDY (A) 06/16/2023 2157   LABSPEC 1.008 06/16/2023 2157   PHURINE 5.0 06/16/2023 2157   GLUCOSEU NEGATIVE 06/16/2023 2157   HGBUR MODERATE (A) 06/16/2023 2157   BILIRUBINUR NEGATIVE 06/16/2023 2157   KETONESUR NEGATIVE 06/16/2023 2157   PROTEINUR 30 (A) 06/16/2023 2157   UROBILINOGEN 0.2 09/24/2010 1310   NITRITE NEGATIVE 06/16/2023  2157   LEUKOCYTESUR LARGE (A) 06/16/2023 2157      Time coordinating discharge: 35 minutes  SIGNED:  Marcellus Scott, MD,  FACP, FHM, [facility], [facility], [facility]   [facility]      To contact the attending provider between 7A-7P or the covering provider during after hours 7P-7A, please log into the web site [email or web address] and access using universal [place] password for that web site. If you do not have the password, please call the hospital operator.

## 2023-06-28 NOTE — Progress Notes (Signed)
ED Pharmacy Antibiotic Sign Off An antibiotic consult was received from an ED provider for vancomycin and cefepime per pharmacy dosing for pneumonia. A chart review was completed to assess appropriateness.   The following one time order(s) were placed:  Cefepime 2g x1 Vancomycin 1500mg  x 1   Further antibiotic and/or antibiotic pharmacy consults should be ordered by the admitting provider if indicated.   Thank you for allowing pharmacy to be a part of this patient's care.   Marja Kays, [facility]  Clinical Pharmacist 06/28/23 7:23 PM

## 2023-06-28 NOTE — Progress Notes (Signed)
RT attempted ABG without success. Patient satting 100% on 3L [place].  Requesting VBG.

## 2023-06-28 NOTE — Progress Notes (Signed)
Attempted to call report to [facility] x3, no answer.

## 2023-06-28 NOTE — ED Provider Notes (Incomplete)
[place] EMERGENCY DEPARTMENT AT [facility] Provider Note   CSN: [id number] Arrival date & time: 06/28/23  1659     History {Add pertinent medical, surgical, social history, OB history to HPI:1} Chief Complaint  Patient presents with   Shortness of Breath    Amy [name] is a 87 y.o. female.  HPI       Home Medications Prior to Admission medications   Medication Sig Start Date End Date Taking? Authorizing Provider  acetaminophen (TYLENOL) 325 MG tablet Take 2 tablets (650 mg total) by mouth every 6 (six) hours as needed for mild pain, moderate pain or fever. 06/28/23   Hongalgi, Maximino Greenland, MD  amLODipine (NORVASC) 10 MG tablet Take 10 mg by mouth daily.    [provider]  aspirin EC 81 MG tablet Take 1 tablet (81 mg total) by mouth daily. 06/28/23   Hongalgi, Maximino Greenland, MD  atenolol (TENORMIN) 50 MG tablet Take 0.5 tablets (25 mg total) by mouth daily. 06/28/23   Hongalgi, Maximino Greenland, MD  bisacodyl (DULCOLAX) 10 MG suppository Place 1 suppository (10 mg total) rectally daily as needed for moderate constipation. 06/28/23   Hongalgi, Maximino Greenland, MD  CALCIUM-VITAMIN D PO Take 1 tablet by mouth daily.    [provider]  cholecalciferol (VITAMIN D) 400 units TABS tablet Take 400 Units by mouth daily.    [provider]  cyclobenzaprine (FLEXERIL) 10 MG tablet Take 10 mg by mouth daily as needed for muscle spasms. 06/12/17   [provider]  diclofenac sodium (VOLTAREN) 1 % GEL Apply 2 g topically daily as needed (pain). 03/15/18   [provider]  feeding supplement (ENSURE ENLIVE / ENSURE PLUS) LIQD Take 237 mLs by mouth 3 (three) times daily between meals for 10 days. 06/28/23 07/08/23  Hongalgi, Maximino Greenland, MD  fluticasone (FLONASE) 50 MCG/ACT nasal spray Place 2 sprays into both nostrils at bedtime.    [provider]  folic acid (FOLVITE) 1 MG tablet Take 1 tablet (1 mg total) by mouth daily. 06/29/23   Hongalgi, Maximino Greenland, MD   loratadine (CLARITIN) 10 MG tablet Take 10 mg by mouth daily.    [provider]  meclizine (ANTIVERT) 25 MG tablet Take 25 mg by mouth 3 (three) times daily as needed for dizziness.    [provider]  Multiple Vitamin (MULTIVITAMIN WITH MINERALS) TABS tablet Take 1 tablet by mouth daily.    [provider]  nitroGLYCERIN (NITROSTAT) 0.4 MG SL tablet Place 0.4 mg under the tongue every 5 (five) minutes as needed for chest pain.     [provider]  pantoprazole (PROTONIX) 40 MG tablet Take 1 tablet (40 mg total) by mouth 2 (two) times daily before a meal. Take 1 tab (40 mg total) twice daily x 7 weeks followed by 1 tab (40 mg total) daily thereafter. 06/28/23   Hongalgi, Maximino Greenland, MD  polyethylene glycol (MIRALAX / GLYCOLAX) 17 g packet Take 17 g by mouth daily. 06/29/23   Hongalgi, Maximino Greenland, MD  senna-docusate (SENOKOT-S) 8.6-50 MG tablet Take 1 tablet by mouth 2 (two) times daily. 06/28/23   Hongalgi, Maximino Greenland, MD  vitamin B-12 (CYANOCOBALAMIN) 1000 MCG tablet Take 1,000 mcg by mouth daily.     [provider]  vitamin E 400 UNIT capsule Take 400 Units by mouth daily.    [provider]      Allergies    Codeine, Oxaprozin, and Sulfonamide derivatives    Review  of Systems   Review of Systems  Physical Exam Updated Vital Signs BP (!) 123/35 (BP Location: Right Arm)   Pulse 61   Temp (!) 93.7 F (34.3 C) (Rectal)   Resp 17   SpO2 100%  Physical Exam  ED Results / Procedures / Treatments   Labs (all labs ordered are listed, but only abnormal results are displayed) Labs Reviewed - No data to display  EKG None  Radiology No results found.  Procedures Procedures  {Document cardiac monitor, telemetry assessment procedure when appropriate:1}  Medications Ordered in ED Medications - No data to display  ED Course/ Medical Decision Making/ A&P   {   Click here for ABCD2, HEART and other calculatorsREFRESH Note before signing  :1}                              Medical Decision Making  ***  {Document critical care time when appropriate:1} {Document review of labs and clinical decision tools ie heart score, Chads2Vasc2 etc:1}  {Document your independent review of radiology images, and any outside records:1} {Document your discussion with family members, caretakers, and with consultants:1} {Document social determinants of health affecting pt's care:1} {Document your decision making why or why not admission, treatments were needed:1} Final Clinical Impression(s) / ED Diagnoses Final diagnoses:  None    Rx / DC Orders ED Discharge Orders     None

## 2023-06-28 NOTE — ED Notes (Signed)
Pt core temp 93.7, bair hugger applied.

## 2023-06-28 NOTE — ED Provider Notes (Signed)
CTA r/o PE felt imperative to assess the patient's critical condition and that the risk given compromised GFR is outweighed by the benefit of the assessment. Discussed with Dr. Valentino Nose who agreed. Will proceed with CTA r/o PE.    Elpidio Anis, PA-C 06/28/23 2104    Alvira Monday, MD 06/29/23 1149

## 2023-06-28 NOTE — ED Triage Notes (Addendum)
Pt arrives to the ed from [facility] for c/o SOB. Pt was in [place] for 2 weeks, sent back to [facility], and immediately after ems arrival to [facility], facility stated she needed to go back to the hospital. Ems stated [facility] had her on a non rebreather at 3/L min with an 02 sat of 88% on ems arrival. Ems increased nonrebreather to 15/L, 02 then 100%, bp and hr stable. Per ems, pt was in hospital for aki. Pt is alert and oriented x 4, swelling noted to right arm, pt states this has been going on for a couple weeks.

## 2023-06-28 NOTE — ED Provider Notes (Signed)
[place] EMERGENCY DEPARTMENT AT [facility] Provider Note   CSN: [id number] Arrival date & time: 06/28/23  1659     History  Chief Complaint  Patient presents with   Shortness of Breath    Amy [name] is a 87 y.o. female.  Received patient by EMS from [facility] with report of SOB. She had been discharged earlier today from [facility] after admission for AKI, anemia, cholecystitis and choledocholithiasis s/p laparoscopic cholecystectomy with in situ biliary drain. Per [facility] staff the patient was sleepy on arrival to their facility with BP 68/40, O2 sat of 85%, improved awareness after being placed on oxygen at 3L with O2 sat 94-95%. The patient and daughter reported she had a syncopal event prior to leaving the hospital and felt unwell. The patient denies chest pain, abdominal pain, nausea.   The history is provided by the patient, the EMS personnel and the nursing home.  Shortness of Breath      Home Medications Prior to Admission medications   Medication Sig Start Date End Date Taking? Authorizing Provider  acetaminophen (TYLENOL) 325 MG tablet Take 2 tablets (650 mg total) by mouth every 6 (six) hours as needed for mild pain, moderate pain or fever. 06/28/23  Yes Hongalgi, Maximino Greenland, MD  amLODipine (NORVASC) 10 MG tablet Take 10 mg by mouth daily.    [provider]  aspirin EC 81 MG tablet Take 1 tablet (81 mg total) by mouth daily. 06/28/23   Hongalgi, Maximino Greenland, MD  atenolol (TENORMIN) 50 MG tablet Take 0.5 tablets (25 mg total) by mouth daily. 06/28/23   Hongalgi, Maximino Greenland, MD  bisacodyl (DULCOLAX) 10 MG suppository Place 1 suppository (10 mg total) rectally daily as needed for moderate constipation. 06/28/23   Hongalgi, Maximino Greenland, MD  CALCIUM-VITAMIN D PO Take 1 tablet by mouth daily.    [provider]  cholecalciferol (VITAMIN D) 400 units TABS tablet Take 400 Units by mouth daily.    [provider]  cyclobenzaprine (FLEXERIL) 10  MG tablet Take 10 mg by mouth daily as needed for muscle spasms. 06/12/17   [provider]  diclofenac sodium (VOLTAREN) 1 % GEL Apply 2 g topically daily as needed (pain). 03/15/18   [provider]  feeding supplement (ENSURE ENLIVE / ENSURE PLUS) LIQD Take 237 mLs by mouth 3 (three) times daily between meals for 10 days. 06/28/23 07/08/23  Hongalgi, Maximino Greenland, MD  fluticasone (FLONASE) 50 MCG/ACT nasal spray Place 2 sprays into both nostrils at bedtime.    [provider]  folic acid (FOLVITE) 1 MG tablet Take 1 tablet (1 mg total) by mouth daily. 06/29/23   Hongalgi, Maximino Greenland, MD  loratadine (CLARITIN) 10 MG tablet Take 10 mg by mouth daily.    [provider]  meclizine (ANTIVERT) 25 MG tablet Take 25 mg by mouth 3 (three) times daily as needed for dizziness.    [provider]  Multiple Vitamin (MULTIVITAMIN WITH MINERALS) TABS tablet Take 1 tablet by mouth daily.    [provider]  nitroGLYCERIN (NITROSTAT) 0.4 MG SL tablet Place 0.4 mg under the tongue every 5 (five) minutes as needed for chest pain.     [provider]  pantoprazole (PROTONIX) 40 MG tablet Take 1 tablet (40 mg total) by mouth 2 (two) times daily before a meal. Take 1 tab (40 mg total) twice daily x 7 weeks followed by 1 tab (40 mg total) daily thereafter. 06/28/23  Hongalgi, Maximino Greenland, MD  polyethylene glycol (MIRALAX / GLYCOLAX) 17 g packet Take 17 g by mouth daily. 06/29/23   Hongalgi, Maximino Greenland, MD  senna-docusate (SENOKOT-S) 8.6-50 MG tablet Take 1 tablet by mouth 2 (two) times daily. 06/28/23   Hongalgi, Maximino Greenland, MD  vitamin B-12 (CYANOCOBALAMIN) 1000 MCG tablet Take 1,000 mcg by mouth daily.     [provider]  vitamin E 400 UNIT capsule Take 400 Units by mouth daily.    [provider]      Allergies    Codeine, Oxaprozin, and Sulfonamide derivatives    Review of Systems   Review of Systems  Respiratory:  Positive for shortness of breath.      Physical Exam Updated Vital Signs BP (!) 119/104   Pulse 66   Temp (!) 95.2 F (35.1 C) (Rectal)   Resp 20   SpO2 99%  Physical Exam Vitals and nursing note reviewed.  Eyes:     Extraocular Movements: Extraocular movements intact.  Cardiovascular:     Rate and Rhythm: Normal rate and regular rhythm.  Pulmonary:     Comments: Currently on NRB, no respiratory distress.  Abdominal:     Comments: Biliary drain in place right upper lateral abdomen, draining thin, pink fluid. Abdomen minimally tender. Soft.  Musculoskeletal:     Cervical back: Normal range of motion.     Comments: Right upper extremity edematous. No redness or warmth. No lower extremity edema.   Skin:    General: Skin is warm and dry.  Neurological:     Mental Status: She is alert.     ED Results / Procedures / Treatments   Labs (all labs ordered are listed, but only abnormal results are displayed) Labs Reviewed  CBC WITH DIFFERENTIAL/PLATELET - Abnormal; Notable for the following components:      Result Value   WBC 15.1 (*)    RBC 2.98 (*)    Hemoglobin 9.0 (*)    HCT 30.6 (*)    MCV 102.7 (*)    MCHC 29.4 (*)    Neutro Abs 12.9 (*)    Abs Immature Granulocytes 0.20 (*)    All other components within normal limits  COMPREHENSIVE METABOLIC PANEL - Abnormal; Notable for the following components:   Sodium 134 (*)    CO2 17 (*)    Glucose, Bld 113 (*)    BUN 42 (*)    Creatinine, Ser 2.50 (*)    Calcium 8.3 (*)    Total Protein 5.6 (*)    Albumin 2.1 (*)    GFR, Estimated 18 (*)    All other components within normal limits  BRAIN NATRIURETIC PEPTIDE - Abnormal; Notable for the following components:   B Natriuretic Peptide 2,041.7 (*)    All other components within normal limits  I-STAT VENOUS BLOOD GAS, ED - Abnormal; Notable for the following components:   pH, Ven 7.193 (*)    Bicarbonate 18.4 (*)    TCO2 20 (*)    Acid-base deficit 9.0 (*)    HCT 27.0 (*)    Hemoglobin 9.2 (*)    All  other components within normal limits  TROPONIN I (HIGH SENSITIVITY) - Abnormal; Notable for the following components:   Troponin I (High Sensitivity) 34 (*)    All other components within normal limits  TROPONIN I (HIGH SENSITIVITY) - Abnormal; Notable for the following components:   Troponin I (High Sensitivity) 32 (*)    All other components within normal limits  MRSA NEXT GEN BY PCR, NASAL  CULTURE, BLOOD (ROUTINE X 2)  CULTURE, BLOOD (ROUTINE X 2)  PROTIME-INR  URINALYSIS, W/ REFLEX TO CULTURE (INFECTION SUSPECTED)  I-STAT CG4 LACTIC ACID, ED  I-STAT ARTERIAL BLOOD GAS, ED    EKG EKG Interpretation Date/Time:  Wednesday June 28 2023 17:09:57 EDT Ventricular Rate:  61 PR Interval:  201 QRS Duration:  89 QT Interval:  464 QTC Calculation: 468 R Axis:   34  Text Interpretation: Sinus rhythm Nonspecific repol abnormality, lateral leads No significant change since last tracing Confirmed by Melene Plan [phone]) on 06/28/2023 7:52:46 PM  Radiology CT ABDOMEN PELVIS W CONTRAST  Result Date: 06/28/2023 CLINICAL DATA:  Shortness of breath and abdominal pain EXAM: CT ABDOMEN AND PELVIS WITH CONTRAST TECHNIQUE: Multidetector CT imaging of the abdomen and pelvis was performed using the standard protocol following bolus administration of intravenous contrast. RADIATION DOSE REDUCTION: This exam was performed according to the departmental dose-optimization program which includes automated exposure control, adjustment of the mA and/or kV according to patient size and/or use of iterative reconstruction technique. CONTRAST:  60mL OMNIPAQUE IOHEXOL 350 MG/ML SOLN COMPARISON:  CT abdomen and pelvis 06/16/2023 FINDINGS: Lower chest: Moderate bilateral pleural effusions and associated atelectasis. Hepatobiliary: Unremarkable liver. Cholecystectomy. Percutaneous drain in the right upper quadrant. Pneumobilia. No biliary dilation. Pancreas: Unremarkable. Spleen: Unremarkable. Adrenals/Urinary Tract:  Stable partially calcified left adrenal gland. No follow-up recommended. Right adrenalectomy. No urinary calculi. Moderate right and mild left hydroureteronephrosis with smooth tapering to the UVJ. Delayed bilateral nephrograms no obstructing stone. Unremarkable bladder. Stomach/Bowel: Normal caliber large and small bowel. Partially and wall thickening versus underdistention of the duodenum. There may be some mild adjacent Peri duodenal stranding. Colonic diverticulosis without diverticulitis. The appendix is normal.Stomach is within normal limits. Vascular/Lymphatic: Aortic atherosclerosis. 3.0 cm infrarenal abdominal aortic aneurysm. No enlarged abdominal or pelvic lymph nodes. Reproductive: Unremarkable. Other: Trace free fluid in the pelvis. No free intraperitoneal air. Musculoskeletal: No acute fracture.  Body wall edema. IMPRESSION: 1. Question duodenitis versus underdistention. 2. Moderate right and mild left hydroureteronephrosis with smooth tapering to the UVJ. No obstructing stone. Delayed bilateral nephrograms suggesting renal dysfunction. 3. Moderate bilateral pleural effusions and associated atelectasis. 4. Percutaneous drain in the right upper quadrant. 5. 3.0 cm infrarenal abdominal aortic aneurysm. Recommend follow-up ultrasound every 3 years. Body wall anasarca. Aortic Atherosclerosis (ICD10-I70.0). Electronically Signed   By: Minerva Fester M.D.   On: 06/28/2023 22:56   CT Angio Chest PE W and/or Wo Contrast  Result Date: 06/28/2023 CLINICAL DATA:  Shortness of breath EXAM: CT ANGIOGRAPHY CHEST WITH CONTRAST TECHNIQUE: Multidetector CT imaging of the chest was performed using the standard protocol during bolus administration of intravenous contrast. Multiplanar CT image reconstructions and MIPs were obtained to evaluate the vascular anatomy. RADIATION DOSE REDUCTION: This exam was performed according to the departmental dose-optimization program which includes automated exposure control,  adjustment of the mA and/or kV according to patient size and/or use of iterative reconstruction technique. CONTRAST:  60mL OMNIPAQUE IOHEXOL 350 MG/ML SOLN COMPARISON:  Chest CT 02/13/2018 FINDINGS: Cardiovascular: Satisfactory opacification of the pulmonary arteries to the segmental level. No evidence of pulmonary embolism. Normal heart size. No pericardial effusion. There severe atherosclerotic calcifications of the aorta and coronary arteries. Mediastinum/Nodes: There is a hyperdense right thyroid nodule measuring 9 mm. There is an inferior right thyroid nodule at the isthmus measuring 16 mm. There are no enlarged mediastinal or hilar lymph nodes. The esophagus is within normal limits. There is a small hiatal  hernia. Lungs/Pleura: There are small to moderate-sized bilateral pleural effusions, right greater than left. There is compressive atelectasis of the bilateral lower lobes. There is no pneumothorax. Upper Abdomen: Catheter is seen in the right upper quadrant. Right adrenalectomy changes are present. Pneumobilia is present. Musculoskeletal: Degenerative changes affect the spine. Review of the MIP images confirms the above findings. IMPRESSION: 1. No evidence for pulmonary embolism. 2. Small to moderate-sized bilateral pleural effusions, right greater than left. 3. Compressive atelectasis of the bilateral lower lobes. 4. Incidental right thyroid nodule measuring 1.6 cm. Recommend non-emergent thyroid ultrasound if clinically warranted given patient age. Reference: J Am Coll Radiol. 2015 Feb;12(2): 143-50 Aortic Atherosclerosis (ICD10-I70.0). Electronically Signed   By: Darliss Cheney M.D.   On: 06/28/2023 22:45   DG Chest Portable 1 View  Result Date: 06/28/2023 CLINICAL DATA:  Shortness of breath EXAM: PORTABLE CHEST 1 VIEW COMPARISON:  Chest radiograph dated 09/24/2010 FINDINGS: Normal lung volumes. Bilateral interstitial opacities. Dense left retrocardiac opacity. Blunting of the bilateral costophrenic  angles. No pneumothorax. The heart size and mediastinal contours are within normal limits. No acute osseous abnormality. IMPRESSION: 1. Bilateral interstitial opacities, likely pulmonary edema, with small bilateral pleural effusions. 2. Dense left retrocardiac opacity, likely atelectasis. Aspiration or pneumonia can be considered in the appropriate clinical setting. Electronically Signed   By: Agustin Cree M.D.   On: 06/28/2023 18:22    Procedures Procedures    Medications Ordered in ED Medications  vancomycin (VANCOREADY) IVPB 1500 mg/300 mL (0 mg Intravenous Stopped 06/28/23 2234)  ceFEPIme (MAXIPIME) 2 g in sodium chloride 0.9 % 100 mL IVPB (0 g Intravenous Stopped 06/28/23 2009)  iohexol (OMNIPAQUE) 350 MG/ML injection 60 mL (60 mLs Intravenous Contrast Given 06/28/23 2201)    ED Course/ Medical Decision Making/ A&P                                 Medical Decision Making 17:30 - Patient awake, responsive on initial exam. O2 saturation decreased to 77-80% with good wave form. No change in mentation. Rapid return to 93%. Dr. Dalene Seltzer at bedside.  17:50 - discussed patient with [facility] nursing staff and obtained added history.  19:20 - patient care has been discussed with PCCM. Feel the patient's condition warrants heightened concern for pulmonary embolus and the benefit of contrasted CT scan outweighs the risk in the setting of CKD, Cr 2.50.  Discussed with nephrology. Will proceed with CTA r/o PE.  No PE. Patient saturations 93% on 3L by [place]. Temp 95.1. Abx started by Dr. Dalene Seltzer (vanc and cefepime).   23:05: Hospitalist consulted for admission.     Amount and/or Complexity of Data Reviewed Independent Historian: EMS    Details: [facility] staff Daughter External Data Reviewed: labs, radiology and notes. Labs: ordered. Radiology: ordered. ECG/medicine tests: ordered and independent interpretation performed.    Details: Per Dr. Adela Lank  Risk Prescription drug  management. Decision regarding hospitalization.  Critical Care Total time providing critical care: 30 minutes           Final Clinical Impression(s) / ED Diagnoses Final diagnoses:  Hypothermia, initial encounter  Hypoxia    Rx / DC Orders ED Discharge Orders     None         Danne Harbor 06/28/23 2337    Alvira Monday, MD 06/29/23 1154

## 2023-06-29 ENCOUNTER — Inpatient Hospital Stay (HOSPITAL_COMMUNITY): Payer: Medicare HMO

## 2023-06-29 DIAGNOSIS — E871 Hypo-osmolality and hyponatremia: Secondary | ICD-10-CM | POA: Diagnosis present

## 2023-06-29 DIAGNOSIS — I130 Hypertensive heart and chronic kidney disease with heart failure and stage 1 through stage 4 chronic kidney disease, or unspecified chronic kidney disease: Secondary | ICD-10-CM | POA: Diagnosis present

## 2023-06-29 DIAGNOSIS — N1330 Unspecified hydronephrosis: Secondary | ICD-10-CM | POA: Diagnosis present

## 2023-06-29 DIAGNOSIS — R54 Age-related physical debility: Secondary | ICD-10-CM | POA: Diagnosis present

## 2023-06-29 DIAGNOSIS — L89323 Pressure ulcer of left buttock, stage 3: Secondary | ICD-10-CM | POA: Diagnosis present

## 2023-06-29 DIAGNOSIS — T68XXXA Hypothermia, initial encounter: Secondary | ICD-10-CM | POA: Diagnosis not present

## 2023-06-29 DIAGNOSIS — M1990 Unspecified osteoarthritis, unspecified site: Secondary | ICD-10-CM | POA: Diagnosis present

## 2023-06-29 DIAGNOSIS — I7143 Infrarenal abdominal aortic aneurysm, without rupture: Secondary | ICD-10-CM | POA: Diagnosis present

## 2023-06-29 DIAGNOSIS — J9601 Acute respiratory failure with hypoxia: Secondary | ICD-10-CM | POA: Diagnosis present

## 2023-06-29 DIAGNOSIS — I10 Essential (primary) hypertension: Secondary | ICD-10-CM | POA: Diagnosis not present

## 2023-06-29 DIAGNOSIS — N179 Acute kidney failure, unspecified: Secondary | ICD-10-CM | POA: Diagnosis present

## 2023-06-29 DIAGNOSIS — R680 Hypothermia, not associated with low environmental temperature: Secondary | ICD-10-CM | POA: Diagnosis present

## 2023-06-29 DIAGNOSIS — J189 Pneumonia, unspecified organism: Secondary | ICD-10-CM

## 2023-06-29 DIAGNOSIS — D500 Iron deficiency anemia secondary to blood loss (chronic): Secondary | ICD-10-CM

## 2023-06-29 DIAGNOSIS — N184 Chronic kidney disease, stage 4 (severe): Secondary | ICD-10-CM | POA: Diagnosis present

## 2023-06-29 DIAGNOSIS — R935 Abnormal findings on diagnostic imaging of other abdominal regions, including retroperitoneum: Secondary | ICD-10-CM | POA: Diagnosis not present

## 2023-06-29 DIAGNOSIS — E538 Deficiency of other specified B group vitamins: Secondary | ICD-10-CM | POA: Diagnosis present

## 2023-06-29 DIAGNOSIS — I5033 Acute on chronic diastolic (congestive) heart failure: Secondary | ICD-10-CM | POA: Diagnosis present

## 2023-06-29 DIAGNOSIS — Z7982 Long term (current) use of aspirin: Secondary | ICD-10-CM | POA: Diagnosis not present

## 2023-06-29 DIAGNOSIS — R0602 Shortness of breath: Secondary | ICD-10-CM | POA: Diagnosis present

## 2023-06-29 DIAGNOSIS — D539 Nutritional anemia, unspecified: Secondary | ICD-10-CM | POA: Diagnosis present

## 2023-06-29 DIAGNOSIS — G8929 Other chronic pain: Secondary | ICD-10-CM

## 2023-06-29 DIAGNOSIS — J90 Pleural effusion, not elsewhere classified: Secondary | ICD-10-CM

## 2023-06-29 DIAGNOSIS — I2510 Atherosclerotic heart disease of native coronary artery without angina pectoris: Secondary | ICD-10-CM | POA: Diagnosis present

## 2023-06-29 DIAGNOSIS — I080 Rheumatic disorders of both mitral and aortic valves: Secondary | ICD-10-CM | POA: Diagnosis present

## 2023-06-29 DIAGNOSIS — Z9049 Acquired absence of other specified parts of digestive tract: Secondary | ICD-10-CM | POA: Diagnosis not present

## 2023-06-29 DIAGNOSIS — E162 Hypoglycemia, unspecified: Secondary | ICD-10-CM | POA: Diagnosis not present

## 2023-06-29 DIAGNOSIS — R627 Adult failure to thrive: Secondary | ICD-10-CM | POA: Diagnosis present

## 2023-06-29 DIAGNOSIS — Z66 Do not resuscitate: Secondary | ICD-10-CM | POA: Diagnosis present

## 2023-06-29 DIAGNOSIS — N189 Chronic kidney disease, unspecified: Secondary | ICD-10-CM | POA: Diagnosis not present

## 2023-06-29 DIAGNOSIS — E872 Acidosis, unspecified: Secondary | ICD-10-CM | POA: Diagnosis present

## 2023-06-29 DIAGNOSIS — Z87891 Personal history of nicotine dependence: Secondary | ICD-10-CM | POA: Diagnosis not present

## 2023-06-29 DIAGNOSIS — E7800 Pure hypercholesterolemia, unspecified: Secondary | ICD-10-CM | POA: Diagnosis present

## 2023-06-29 LAB — CBC
HCT: 27.6 % — ABNORMAL LOW (ref 36.0–46.0)
Hemoglobin: 8.2 g/dL — ABNORMAL LOW (ref 12.0–15.0)
MCH: 30.4 pg (ref 26.0–34.0)
MCHC: 29.7 g/dL — ABNORMAL LOW (ref 30.0–36.0)
MCV: 102.2 fL — ABNORMAL HIGH (ref 80.0–100.0)
Platelets: 198 10*3/uL (ref 150–400)
RBC: 2.7 MIL/uL — ABNORMAL LOW (ref 3.87–5.11)
RDW: 15.2 % (ref 11.5–15.5)
WBC: 13.1 10*3/uL — ABNORMAL HIGH (ref 4.0–10.5)
nRBC: 0 % (ref 0.0–0.2)

## 2023-06-29 LAB — BASIC METABOLIC PANEL
Anion gap: 10 (ref 5–15)
BUN: 42 mg/dL — ABNORMAL HIGH (ref 8–23)
CO2: 16 mmol/L — ABNORMAL LOW (ref 22–32)
Calcium: 8.1 mg/dL — ABNORMAL LOW (ref 8.9–10.3)
Chloride: 105 mmol/L (ref 98–111)
Creatinine, Ser: 2.63 mg/dL — ABNORMAL HIGH (ref 0.44–1.00)
GFR, Estimated: 17 mL/min — ABNORMAL LOW (ref >60)
Glucose, Bld: 87 mg/dL (ref 70–99)
Potassium: 4.9 mmol/L (ref 3.5–5.1)
Sodium: 131 mmol/L — ABNORMAL LOW (ref 135–145)

## 2023-06-29 MED ORDER — FOLIC ACID 1 MG PO TABS
1.0000 mg | ORAL_TABLET | Freq: Every day | ORAL | Status: DC
  Administered 2023-06-29 – 2023-06-30 (×2): 1 mg via ORAL
  Filled 2023-06-29 (×2): qty 1

## 2023-06-29 MED ORDER — LORATADINE 10 MG PO TABS
10.0000 mg | ORAL_TABLET | Freq: Every day | ORAL | Status: DC
  Administered 2023-06-29 – 2023-06-30 (×2): 10 mg via ORAL
  Filled 2023-06-29 (×2): qty 1

## 2023-06-29 MED ORDER — AMLODIPINE BESYLATE 10 MG PO TABS
10.0000 mg | ORAL_TABLET | Freq: Every day | ORAL | Status: DC
  Administered 2023-06-29: 10 mg via ORAL
  Filled 2023-06-29 (×2): qty 1

## 2023-06-29 MED ORDER — ASPIRIN 81 MG PO TBEC
81.0000 mg | DELAYED_RELEASE_TABLET | Freq: Every day | ORAL | Status: DC
  Administered 2023-06-29 – 2023-06-30 (×2): 81 mg via ORAL
  Filled 2023-06-29 (×2): qty 1

## 2023-06-29 MED ORDER — ATENOLOL 25 MG PO TABS
25.0000 mg | ORAL_TABLET | Freq: Every day | ORAL | Status: DC
  Administered 2023-06-29: 25 mg via ORAL
  Filled 2023-06-29 (×2): qty 1

## 2023-06-29 MED ORDER — ENOXAPARIN SODIUM 30 MG/0.3ML IJ SOSY
30.0000 mg | PREFILLED_SYRINGE | Freq: Every day | INTRAMUSCULAR | Status: DC
  Administered 2023-06-29 – 2023-06-30 (×2): 30 mg via SUBCUTANEOUS
  Filled 2023-06-29 (×2): qty 0.3

## 2023-06-29 MED ORDER — ACETAMINOPHEN 325 MG PO TABS: 650.0000 mg | ORAL_TABLET | Freq: Four times a day (QID) | ORAL | Status: DC | PRN

## 2023-06-29 MED ORDER — HYDROCODONE-ACETAMINOPHEN 5-325 MG PO TABS
1.0000 | ORAL_TABLET | Freq: Four times a day (QID) | ORAL | Status: DC | PRN
  Administered 2023-06-29 (×2): 1 via ORAL
  Filled 2023-06-29 (×2): qty 1

## 2023-06-29 MED ORDER — SODIUM CHLORIDE 0.9 % IV SOLN
2.0000 g | INTRAVENOUS | Status: DC
  Administered 2023-06-29: 2 g via INTRAVENOUS
  Filled 2023-06-29: qty 12.5

## 2023-06-29 MED ORDER — TRAMADOL HCL 50 MG PO TABS
50.0000 mg | ORAL_TABLET | Freq: Once | ORAL | Status: AC
  Administered 2023-06-29: 50 mg via ORAL
  Filled 2023-06-29: qty 1

## 2023-06-29 MED ORDER — VITAMIN D 25 MCG (1000 UNIT) PO TABS
1000.0000 [IU] | ORAL_TABLET | Freq: Every day | ORAL | Status: DC
  Administered 2023-06-29 – 2023-06-30 (×2): 1000 [IU] via ORAL
  Filled 2023-06-29 (×2): qty 1

## 2023-06-29 MED ORDER — MECLIZINE HCL 25 MG PO TABS: 25.0000 mg | ORAL_TABLET | Freq: Three times a day (TID) | ORAL | Status: DC | PRN

## 2023-06-29 MED ORDER — VITAMIN B-12 1000 MCG PO TABS
1000.0000 ug | ORAL_TABLET | Freq: Every day | ORAL | Status: DC
  Administered 2023-06-29 – 2023-06-30 (×2): 1000 ug via ORAL
  Filled 2023-06-29 (×2): qty 1

## 2023-06-29 MED ORDER — MELATONIN 3 MG PO TABS: 3.0000 mg | ORAL_TABLET | Freq: Every day | ORAL | Status: DC

## 2023-06-29 MED ORDER — PANTOPRAZOLE SODIUM 40 MG PO TBEC
40.0000 mg | DELAYED_RELEASE_TABLET | Freq: Two times a day (BID) | ORAL | Status: DC
  Administered 2023-06-29 – 2023-06-30 (×3): 40 mg via ORAL
  Filled 2023-06-29 (×3): qty 1

## 2023-06-29 MED ORDER — GERHARDT'S BUTT CREAM
TOPICAL_CREAM | Freq: Two times a day (BID) | CUTANEOUS | Status: DC
  Administered 2023-06-29: 1 via TOPICAL
  Filled 2023-06-29: qty 1

## 2023-06-29 MED ORDER — VANCOMYCIN HCL 750 MG/150ML IV SOLN
750.0000 mg | INTRAVENOUS | Status: DC
  Filled 2023-06-29: qty 150

## 2023-06-29 NOTE — ED Notes (Signed)
Bear hugger removed from patient  

## 2023-06-29 NOTE — Progress Notes (Signed)
Briefly, patient is a 87 year old female with HTN, CKD4 who was readmitted after discharge that morning after treatment for worsened AKI on CKD4 and UGIB secondary to PUD when she developed worsened shortness of breath.  Workup in the ED was notable for hypothermia to 93 and acute hypoxic respiratory failure requiring NRB at 10 L.  CTA chest was notable for bilateral pleural effusions and patient was admitted for acute hypoxic respiratory failure.  Patient states that she does feel better now than she did when she was readmitted.  Notes she is able to lay flat and is not terribly short of breath.  Her main concern is that she has just had a bowel movement and would like to be cleaned up.   Assessment and plan: Acute hypoxic respiratory failure Bilateral pleural effusions Per H&P, pleural effusions were thought to be likely parapneumonic given new cough, hypothermia and increased WBC from 11-15.  CT shows some compressive atelectasis and modest bilateral pleural effusions. Patient was noted to have an EF of 60% so this was not thought to be CHF however BNP is noted to be 2000.   Per chart review, patient did not get any diuretics either in ED or on the floor. Patient has been weaned down from 10 L oxygen down to 1 L nasal cannula and patient was comfortable when I saw her Given clinical response, will vancomycin and cefepime started earlier today--but will order procalcitonin for tomorrow morning If procalcitonin is negative, wWould have low threshold for adding diuretics  Would also consider cardiology consultation to optimize cardiac function even heart strain with BNP of 2000  Kidney dysfunction Patient patient was noted to have minimal increase in creatinine from 2.5-2.63  Of note, she did get contrast in the ED for CTA creatinine may or may not worsen overnight Also of note, patient appears to have new hydroureteronephrosis on CT, renal ultrasound is pending Patient was not started on IV  fluid resuscitation which could be started if creatinine worsens  Hypothermia Resolved with treatment   Other chronic issues not addressed today, management per H&P done earlier today.

## 2023-06-29 NOTE — Progress Notes (Signed)
   06/29/23 2038  Assess: MEWS Score  Temp (!) 92.3 F (33.5 C)  BP (!) 96/47  MAP (mmHg) (!) 64  ECG Heart Rate 60  Resp (!) 24  Level of Consciousness Alert  SpO2 94 %  O2 Device Room Air  Assess: MEWS Score  MEWS Temp 2  MEWS Systolic 1  MEWS Pulse 0  MEWS RR 1  MEWS LOC 0  MEWS Score 4  MEWS Score Color Red  Assess: if the MEWS score is Yellow or Red  Were vital signs accurate and taken at a resting state? Yes  Does the patient meet 2 or more of the SIRS criteria? Yes  Does the patient have a confirmed or suspected source of infection? Yes  MEWS guidelines implemented  Yes, red  Treat  MEWS Interventions Considered administering scheduled or prn medications/treatments as ordered  Take Vital Signs  Increase Vital Sign Frequency  Red: Q1hr x2, continue Q4hrs until patient remains green for 12hrs  Escalate  MEWS: Escalate Red: Discuss with charge nurse and notify provider. Consider notifying RRT. If remains red for 2 hours consider need for higher level of care  Notify: Charge Nurse/RN  Name of Charge Nurse/RN Notified Matthew, RN  Provider Notification  Provider Name/Title Dr. Antionette Char  Date Provider Notified 06/29/23  Time Provider Notified 2044  Method of Notification Page  Notification Reason Change in status  Assess: SIRS CRITERIA  SIRS Temperature  1  SIRS Pulse 0  SIRS Respirations  1  SIRS WBC 0  SIRS Score Sum  2

## 2023-06-29 NOTE — Progress Notes (Signed)
Pharmacy Antibiotic Note  Amy [name] is a 87 y.o. female admitted on 06/28/2023 with pneumonia.  Pharmacy has been consulted for Vancomycin/Cefepime dosing. WBC is elevated. Noted renal dysfunction.   Plan: Vancomycin 750 mg IV q48h >>>Estimated AUC: 488 Cefepime 2g IV q24h Trend WBC, temp, renal function  F/U infectious work-up Drug levels as indicated   Temp (24hrs), Avg:95.6 F (35.3 C), Min:93.7 F (34.3 C), Max:97.7 F (36.5 C)  Recent Labs  Lab 06/22/23 0157 06/23/23 0934 06/24/23 0604 06/25/23 0629 06/26/23 0315 06/28/23 0332 06/28/23 1807 06/28/23 1941  WBC 10.0  --   --  12.7* 12.1* 10.7* 15.1*  --   CREATININE 2.36*   < > 2.47* 2.17* 2.48* 2.41* 2.50*  --   LATICACIDVEN  --   --   --   --   --   --   --  0.9   < > = values in this interval not displayed.    Estimated Creatinine Clearance: 14.8 mL/min (A) (by C-G formula based on SCr of 2.5 mg/dL (H)).    Allergies  Allergen Reactions   Codeine Nausea And Vomiting    REACTION: nausea/vomiting   Oxaprozin     Other Reaction(s): Unknown   Sulfonamide Derivatives Itching and Rash    Abran Duke, PharmD, BCPS Clinical Pharmacist Phone: [phone]

## 2023-06-29 NOTE — Assessment & Plan Note (Addendum)
-  s/p Laparoscopic cholecystectomy 8/14 with general surgery Dr. Fredricka Bonine with percutaneous drain in place -abdominal exam is benign -continue daily drain care

## 2023-06-29 NOTE — Assessment & Plan Note (Addendum)
Acute on chronic kidney injury stage IV -worsening creatinine of 2.5 from prior of 2.41  -CT abdomen pelvis with moderate right and mild left hydroureteronephrosis with smooth tapering to the UVJ.  No obstructing stone.  Delayed bilateral nephrograms to suggest renal dysfunction.  Percutaneous drain in place in right upper quadrant. -hydroureteronephrosis is new since imaging last week. No urine output thus far in ED. Check bladder scan. Check renal ultrasound.  -hold all nephrotoxic agent. She did receive contrast for CTA chest in ED as ED provider had high suspicion for PE and consulted nephrology before proceeding with contrasted imaging. Would expect her creatinine to worsen on repeat lab work.

## 2023-06-29 NOTE — Assessment & Plan Note (Signed)
-  stable. Pt had recent hx of upper GI bleed from peptic ulcer -continue BID PPI -continue folic and vitamin B12 supplementation

## 2023-06-29 NOTE — Assessment & Plan Note (Signed)
-  continue hydrocodone-acetaminophen-unclear why this was discontinued at discharge -verified in [place] substance database and her prescriptions have been appropriately filled and written by providers in the same clinic.

## 2023-06-29 NOTE — Assessment & Plan Note (Signed)
-  stable. Continue amlodipine and atenolol

## 2023-06-29 NOTE — Assessment & Plan Note (Signed)
-   3 cm infrarenal abdominal aortic aneurysm.  Recommend follow-up ultrasound in 3 years.

## 2023-06-29 NOTE — Plan of Care (Signed)
  Problem: Education: Goal: Knowledge of General Education information will improve Description Including pain rating scale, medication(s)/side effects and non-pharmacologic comfort measures Outcome: Progressing   Problem: Health Behavior/Discharge Planning: Goal: Ability to manage health-related needs will improve Outcome: Progressing   

## 2023-06-29 NOTE — Plan of Care (Signed)
  Problem: Clinical Measurements: Goal: Respiratory complications will improve Outcome: Progressing   

## 2023-06-29 NOTE — Assessment & Plan Note (Addendum)
-  presented with temperature of 65F -improving on Bair hugger -suspect secondary to infection likely pneumonia-blood culture pending

## 2023-06-29 NOTE — ED Notes (Signed)
ED TO INPATIENT HANDOFF REPORT  ED Nurse Name and Phone #: Marcello Moores [phone]  [facility] Name/Age/Gender Amy [name] 87 y.o. female Room/Bed: 033C/033C  Code Status   Code Status: Prior  Home/SNF/Other Nursing Home Patient oriented to: self, place, time, and situation Is this baseline? Yes   Triage Complete: Triage complete  Chief Complaint Acute respiratory failure with hypoxia (HCC) [J96.01]  Triage Note Pt arrives to the ed from [facility] for c/o SOB. Pt was in [place] for 2 weeks, sent back to [facility], and immediately after ems arrival to [facility], facility stated she needed to go back to the hospital. Ems stated [facility] had her on a non rebreather at 3/L min with an 02 sat of 88% on ems arrival. Ems increased nonrebreather to 15/L, 02 then 100%, bp and hr stable. Per ems, pt was in hospital for aki. Pt is alert and oriented x 4, swelling noted to right arm, pt states this has been going on for a couple weeks.    Allergies Allergies  Allergen Reactions   Codeine Nausea And Vomiting    REACTION: nausea/vomiting   Oxaprozin     Other Reaction(s): Unknown   Sulfonamide Derivatives Itching and Rash    Level of Care/Admitting Diagnosis ED Disposition     ED Disposition  Admit   Condition  --   Comment  Hospital Area: [facility] [[id number]]  Level of Care: Telemetry Cardiac [[id number]]  May admit patient to [facility] or [facility] if equivalent level of care is available:: No  Covid Evaluation: Asymptomatic - no recent exposure (last 10 days) testing not required  Diagnosis: Acute respiratory failure with hypoxia [facility]) [[id number]]  Admitting Physician: Anselm Jungling [[id number]]  Attending Physician: Anselm Jungling [[id number]]  Certification:: I certify this patient will need inpatient services for at least 2 midnights  Expected Medical Readiness: 07/03/2023          B Medical/Surgery History Past Medical History:  Diagnosis Date   Adrenal  myelolipoma    Arthritis    CAD in native artery    CKD (chronic kidney disease)    HTN (hypertension)    Hypercholesterolemia    Past Surgical History:  Procedure Laterality Date   ADRENALECTOMY Right    BACK SURGERY     BALLOON DILATION N/A 06/19/2023   Procedure: [place];  Surgeon: Lynann Bologna, MD;  Location: [facility] ENDOSCOPY;  Service: Gastroenterology;  Laterality: N/A;   BIOPSY  06/19/2023   Procedure: BIOPSY;  Surgeon: Lynann Bologna, MD;  Location: [facility] ENDOSCOPY;  Service: Gastroenterology;;   CARDIAC CATHETERIZATION  01/02/2009   The anterior descending artery is moderate  in size. There is an eccentric 60-70% proximal stenosis followed by 50-60% mid stenosis. EF 75-80%   CARDIOVASCULAR STRESS TEST  12/26/2008   EF 65%   CATARACT EXTRACTION     CHOLECYSTECTOMY N/A 06/21/2023   Procedure: LAPAROSCOPIC CHOLECYSTECTOMY;  Surgeon: Berna Bue, MD;  Location: [facility] OR;  Service: General;  Laterality: N/A;   COLONOSCOPY N/A 02/15/2017   Procedure: COLONOSCOPY;  Surgeon: Rachael Fee, MD;  Location: [facility] ENDOSCOPY;  Service: Endoscopy;  Laterality: N/A;   CYSTECTOMY     Right Hand   ERCP N/A 06/19/2023   Procedure: ENDOSCOPIC RETROGRADE CHOLANGIOPANCREATOGRAPHY (ERCP);  Surgeon: Lynann Bologna, MD;  Location: [facility] ENDOSCOPY;  Service: Gastroenterology;  Laterality: N/A;   ESOPHAGOGASTRODUODENOSCOPY N/A 06/19/2023   Procedure: ESOPHAGOGASTRODUODENOSCOPY (EGD);  Surgeon: Lynann Bologna, MD;  Location: [facility] ENDOSCOPY;  Service:  Gastroenterology;  Laterality: N/A;   HEMORROIDECTOMY     REMOVAL OF STONES  06/19/2023   Procedure: REMOVAL OF STONES;  Surgeon: Lynann Bologna, MD;  Location: [facility] ENDOSCOPY;  Service: Gastroenterology;;   Dennison Mascot  06/19/2023   Procedure: Dennison Mascot;  Surgeon: Lynann Bologna, MD;  Location: [facility] ENDOSCOPY;  Service: Gastroenterology;;   TONSILLECTOMY     TOTAL ABDOMINAL HYSTERECTOMY       A IV Location/Drains/Wounds Patient Lines/Drains/Airways  Status     Active Line/Drains/Airways     Name Placement date Placement time Site Days   Peripheral IV 06/28/23 20 G Left Antecubital 06/28/23  --  Antecubital  1   Closed System Drain 1 Right RLQ Bulb (JP) 19 Fr. 06/21/23  1328  RLQ  8   External Urinary Catheter 06/17/23  0519  --  12   Incision (Closed) 06/21/23 Abdomen Other (Comment) 06/21/23  1325  -- 8   Incision - 4 Ports Abdomen 1: Right;Lower 2: Right;Upper 3: Left;Upper 4: Umbilicus 06/21/23  1205  -- 8            Intake/Output Last 24 hours  Intake/Output Summary (Last 24 hours) at 06/29/2023 0051 Last data filed at 06/28/2023 2234 Gross per 24 hour  Intake 400.41 ml  Output --  Net 400.41 ml    Labs/Imaging Results for orders placed or performed during the hospital encounter of 06/28/23 (from the past 48 hour(s))  CBC with Differential     Status: Abnormal   Collection Time: 06/28/23  6:07 PM  Result Value Ref Range   WBC 15.1 (H) 4.0 - 10.5 K/uL   RBC 2.98 (L) 3.87 - 5.11 MIL/uL   Hemoglobin 9.0 (L) 12.0 - 15.0 g/dL   HCT 09.8 (L) 11.9 - 14.7 %   MCV 102.7 (H) 80.0 - 100.0 fL   MCH 30.2 26.0 - 34.0 pg   MCHC 29.4 (L) 30.0 - 36.0 g/dL   RDW 82.9 56.2 - 13.0 %   Platelets 206 150 - 400 K/uL   nRBC 0.0 0.0 - 0.2 %   Neutrophils Relative % 86 %   Neutro Abs 12.9 (H) 1.7 - 7.7 K/uL   Lymphocytes Relative 7 %   Lymphs Abs 1.1 0.7 - 4.0 K/uL   Monocytes Relative 5 %   Monocytes Absolute 0.8 0.1 - 1.0 K/uL   Eosinophils Relative 1 %   Eosinophils Absolute 0.1 0.0 - 0.5 K/uL   Basophils Relative 0 %   Basophils Absolute 0.0 0.0 - 0.1 K/uL   Immature Granulocytes 1 %   Abs Immature Granulocytes 0.20 (H) 0.00 - 0.07 K/uL    Comment: Performed at [facility], [address], [place], [place] [address]  Comprehensive metabolic panel     Status: Abnormal   Collection Time: 06/28/23  6:07 PM  Result Value Ref Range   Sodium 134 (L) 135 - 145 mmol/L   Potassium 5.1 3.5 - 5.1 mmol/L   Chloride 107 98 -  111 mmol/L   CO2 17 (L) 22 - 32 mmol/L   Glucose, Bld 113 (H) 70 - 99 mg/dL    Comment: Glucose reference range applies only to samples taken after fasting for at least 8 hours.   BUN 42 (H) 8 - 23 mg/dL   Creatinine, Ser 4.69 (H) 0.44 - 1.00 mg/dL   Calcium 8.3 (L) 8.9 - 10.3 mg/dL   Total Protein 5.6 (L) 6.5 - 8.1 g/dL   Albumin 2.1 (L) 3.5 - 5.0 g/dL   AST  16 15 - 41 U/L   ALT 6 0 - 44 U/L   Alkaline Phosphatase 55 38 - 126 U/L   Total Bilirubin 0.3 0.3 - 1.2 mg/dL   GFR, Estimated 18 (L) >60 mL/min    Comment: (NOTE) Calculated using the CKD-EPI Creatinine Equation (2021)    Anion gap 10 5 - 15    Comment: Performed at [facility], [address], [place], [place] [address]  Troponin I (High Sensitivity)     Status: Abnormal   Collection Time: 06/28/23  6:07 PM  Result Value Ref Range   Troponin I (High Sensitivity) 34 (H) <18 ng/L    Comment: (NOTE) Elevated high sensitivity troponin I (hsTnI) values and significant  changes across serial measurements may suggest ACS but many other  chronic and acute conditions are known to elevate hsTnI results.  Refer to the "Links" section for chest pain algorithms and additional  guidance. Performed at [facility], [address], [place], [place] [address]   Protime-INR     Status: None   Collection Time: 06/28/23  6:07 PM  Result Value Ref Range   Prothrombin Time 13.4 11.4 - 15.2 seconds   INR 1.0 0.8 - 1.2    Comment: (NOTE) INR goal varies based on device and disease states. Performed at [facility], [address], [place], [place] [address]   Brain natriuretic peptide     Status: Abnormal   Collection Time: 06/28/23  6:08 PM  Result Value Ref Range   B Natriuretic Peptide 2,041.7 (H) 0.0 - 100.0 pg/mL    Comment: Performed at [facility], [address], [place], [place] [address]  I-Stat venous blood gas, ED     Status: Abnormal   Collection Time: 06/28/23  6:17 PM  Result Value Ref Range    pH, Ven 7.193 (LL) 7.25 - 7.43   pCO2, Ven 47.9 44 - 60 mmHg   pO2, Ven 34 32 - 45 mmHg   Bicarbonate 18.4 (L) 20.0 - 28.0 mmol/L   TCO2 20 (L) 22 - 32 mmol/L   O2 Saturation 51 %   Acid-base deficit 9.0 (H) 0.0 - 2.0 mmol/L   Sodium 135 135 - 145 mmol/L   Potassium 4.9 3.5 - 5.1 mmol/L   Calcium, Ion 1.15 1.15 - 1.40 mmol/L   HCT 27.0 (L) 36.0 - 46.0 %   Hemoglobin 9.2 (L) 12.0 - 15.0 g/dL   Sample type VENOUS    Comment NOTIFIED PHYSICIAN   MRSA Next Gen by PCR, Nasal     Status: None   Collection Time: 06/28/23  7:38 PM   Specimen: Nasal Mucosa; Nasal Swab  Result Value Ref Range   MRSA by PCR Next Gen NOT DETECTED NOT DETECTED    Comment: (NOTE) The GeneXpert MRSA Assay (FDA approved for NASAL specimens only), is one component of a comprehensive MRSA colonization surveillance program. It is not intended to diagnose MRSA infection nor to guide or monitor treatment for MRSA infections. Test performance is not FDA approved in patients less than 3 years old. Performed at [facility], [address], [place], [place] [address]   I-Stat CG4 Lactic Acid     Status: None   Collection Time: 06/28/23  7:41 PM  Result Value Ref Range   Lactic Acid, Venous 0.9 0.5 - 1.9 mmol/L  Troponin I (High Sensitivity)     Status: Abnormal   Collection Time: 06/28/23  8:14 PM  Result Value Ref Range   Troponin I (High Sensitivity) 32 (H) <18 ng/L    Comment: (NOTE) Elevated high sensitivity troponin I (hsTnI) values and significant  changes across serial measurements may suggest ACS but many other  chronic and acute conditions are known to elevate hsTnI results.  Refer to the "Links" section for chest pain algorithms and additional  guidance. Performed at [facility], [address], [place], [place] [address]    CT ABDOMEN PELVIS W CONTRAST  Result Date: 06/28/2023 CLINICAL DATA:  Shortness of breath and abdominal pain EXAM: CT ABDOMEN AND PELVIS WITH CONTRAST  TECHNIQUE: Multidetector CT imaging of the abdomen and pelvis was performed using the standard protocol following bolus administration of intravenous contrast. RADIATION DOSE REDUCTION: This exam was performed according to the departmental dose-optimization program which includes automated exposure control, adjustment of the mA and/or kV according to patient size and/or use of iterative reconstruction technique. CONTRAST:  60mL OMNIPAQUE IOHEXOL 350 MG/ML SOLN COMPARISON:  CT abdomen and pelvis 06/16/2023 FINDINGS: Lower chest: Moderate bilateral pleural effusions and associated atelectasis. Hepatobiliary: Unremarkable liver. Cholecystectomy. Percutaneous drain in the right upper quadrant. Pneumobilia. No biliary dilation. Pancreas: Unremarkable. Spleen: Unremarkable. Adrenals/Urinary Tract: Stable partially calcified left adrenal gland. No follow-up recommended. Right adrenalectomy. No urinary calculi. Moderate right and mild left hydroureteronephrosis with smooth tapering to the UVJ. Delayed bilateral nephrograms no obstructing stone. Unremarkable bladder. Stomach/Bowel: Normal caliber large and small bowel. Partially and wall thickening versus underdistention of the duodenum. There may be some mild adjacent Peri duodenal stranding. Colonic diverticulosis without diverticulitis. The appendix is normal.Stomach is within normal limits. Vascular/Lymphatic: Aortic atherosclerosis. 3.0 cm infrarenal abdominal aortic aneurysm. No enlarged abdominal or pelvic lymph nodes. Reproductive: Unremarkable. Other: Trace free fluid in the pelvis. No free intraperitoneal air. Musculoskeletal: No acute fracture.  Body wall edema. IMPRESSION: 1. Question duodenitis versus underdistention. 2. Moderate right and mild left hydroureteronephrosis with smooth tapering to the UVJ. No obstructing stone. Delayed bilateral nephrograms suggesting renal dysfunction. 3. Moderate bilateral pleural effusions and associated atelectasis. 4.  Percutaneous drain in the right upper quadrant. 5. 3.0 cm infrarenal abdominal aortic aneurysm. Recommend follow-up ultrasound every 3 years. Body wall anasarca. Aortic Atherosclerosis (ICD10-I70.0). Electronically Signed   By: Minerva Fester M.D.   On: 06/28/2023 22:56   CT Angio Chest PE W and/or Wo Contrast  Result Date: 06/28/2023 CLINICAL DATA:  Shortness of breath EXAM: CT ANGIOGRAPHY CHEST WITH CONTRAST TECHNIQUE: Multidetector CT imaging of the chest was performed using the standard protocol during bolus administration of intravenous contrast. Multiplanar CT image reconstructions and MIPs were obtained to evaluate the vascular anatomy. RADIATION DOSE REDUCTION: This exam was performed according to the departmental dose-optimization program which includes automated exposure control, adjustment of the mA and/or kV according to patient size and/or use of iterative reconstruction technique. CONTRAST:  60mL OMNIPAQUE IOHEXOL 350 MG/ML SOLN COMPARISON:  Chest CT 02/13/2018 FINDINGS: Cardiovascular: Satisfactory opacification of the pulmonary arteries to the segmental level. No evidence of pulmonary embolism. Normal heart size. No pericardial effusion. There severe atherosclerotic calcifications of the aorta and coronary arteries. Mediastinum/Nodes: There is a hyperdense right thyroid nodule measuring 9 mm. There is an inferior right thyroid nodule at the isthmus measuring 16 mm. There are no enlarged mediastinal or hilar lymph nodes. The esophagus is within normal limits. There is a small hiatal hernia. Lungs/Pleura: There are small to moderate-sized bilateral pleural effusions, right greater than left. There is compressive atelectasis of the bilateral lower lobes. There is  no pneumothorax. Upper Abdomen: Catheter is seen in the right upper quadrant. Right adrenalectomy changes are present. Pneumobilia is present. Musculoskeletal: Degenerative changes affect the spine. Review of the MIP images confirms the  above findings. IMPRESSION: 1. No evidence for pulmonary embolism. 2. Small to moderate-sized bilateral pleural effusions, right greater than left. 3. Compressive atelectasis of the bilateral lower lobes. 4. Incidental right thyroid nodule measuring 1.6 cm. Recommend non-emergent thyroid ultrasound if clinically warranted given patient age. Reference: J Am Coll Radiol. 2015 Feb;12(2): 143-50 Aortic Atherosclerosis (ICD10-I70.0). Electronically Signed   By: Darliss Cheney M.D.   On: 06/28/2023 22:45   DG Chest Portable 1 View  Result Date: 06/28/2023 CLINICAL DATA:  Shortness of breath EXAM: PORTABLE CHEST 1 VIEW COMPARISON:  Chest radiograph dated 09/24/2010 FINDINGS: Normal lung volumes. Bilateral interstitial opacities. Dense left retrocardiac opacity. Blunting of the bilateral costophrenic angles. No pneumothorax. The heart size and mediastinal contours are within normal limits. No acute osseous abnormality. IMPRESSION: 1. Bilateral interstitial opacities, likely pulmonary edema, with small bilateral pleural effusions. 2. Dense left retrocardiac opacity, likely atelectasis. Aspiration or pneumonia can be considered in the appropriate clinical setting. Electronically Signed   By: Agustin Cree M.D.   On: 06/28/2023 18:22    Pending Labs Unresulted Labs (From admission, onward)     Start     Ordered   06/28/23 1918  Urinalysis, w/ Reflex to Culture (Infection Suspected) -Urine, Clean Catch  Once,   URGENT       Question:  Specimen Source  Answer:  Urine, Clean Catch   06/28/23 1918   06/28/23 1916  Blood culture (routine x 2)  BLOOD CULTURE X 2,   R (with STAT occurrences)      06/28/23 1918            Vitals/Pain Today's Vitals   06/28/23 2345 06/29/23 0000 06/29/23 0015 06/29/23 0049  BP: (!) 118/41 (!) 129/51 (!) 120/46   Pulse: 68 68 70   Resp: 20 18 (!) 23   Temp:    97.6 F (36.4 C)  TempSrc:    Oral  SpO2: 99% 98% 99%   PainSc:        Isolation Precautions No active  isolations  Medications Medications  vancomycin (VANCOREADY) IVPB 1500 mg/300 mL (0 mg Intravenous Stopped 06/28/23 2234)  ceFEPIme (MAXIPIME) 2 g in sodium chloride 0.9 % 100 mL IVPB (0 g Intravenous Stopped 06/28/23 2009)  iohexol (OMNIPAQUE) 350 MG/ML injection 60 mL (60 mLs Intravenous Contrast Given 06/28/23 2201)    Mobility walks with device     Focused Assessments     R Recommendations: See Admitting Provider Note  Report given to:   Additional Notes:

## 2023-06-29 NOTE — Assessment & Plan Note (Addendum)
Bilateral pleural effusion -likely parapneumonic effusion with new cough, worsening leukocytosis, hypothermia and acute hypoxic respiratory failure on presentation. She also has bilateral LE edema but this is chronic and not worse from baseline. Last had echocardiogram on 06/20/23 with EF of 60-65% with no regional wall abnormalities. Diastolic parameters were indeterminate. Moderate to severe aortic valve regurgitation. Doubt new onset CHF -will continue IV Vancomycin and Cefepime -blood cultures pending -Maintain O2 >94% and monitor on continuous pulse ox

## 2023-06-29 NOTE — H&P (Signed)
History and Physical    Patient: Amy [name] WRU:[id number] DOB: Feb 21, 1936 DOA: 06/28/2023 DOS: the patient was seen and examined on 06/29/2023 PCP: Geoffry Paradise, MD  Patient coming from: [facility]  Chief Complaint:  Chief Complaint  Patient presents with   Shortness of Breath   HPI: Amy [name] is a 87 y.o. female with medical history significant of HTN, GERD, iron deficiency anemia, CKD stage IV, PUD who presents with acute hypoxic after being just discharged from the hospital earlier in the day.   Patient was discharged earlier today to rehab after she presented to [facility] on 06/16/2023 at the direction of her outpatient PCP for concerns of worsening kidney function. Creatinine of 3.5 for which her HCTZ has been held.  She also reports nausea, vomiting, abdominal pain in the epigastric region with progressive weakness. She was noted to have anemia with hemoglobin of 10.9 from baseline of 13 with positive FOBT.CT abdomen/pelvis without contrast with persistent cholelithiasis and persistent choledocholithiasis with slightly increased size, bile duct dilation, no CT finding of acute cholecystitis. GI consulted and underwent ERCP on 06/19/2023 findings of 8 mm ulcer prepyloric area without active bleeding, Stomach biopsy showed gastric antral mucosa with mild chronic active gastritis.  No H. pylori identified.  Negative for intestinal metaplasia or dysplasia.She was started on twice daily PPI. She also had findings significant for CBD dilated to 15 mm with multiple filling defects s/p sphincterotomy and sweeping of the biliary tree with extraction of stones, nonfilling of the cystic duct/gallbladder.  General surgery was consulted and patient underwent laparoscopic cholecystectomy with lysis of adhesions by Dr. Fredricka Bonine on 06/21/2023. She was discharged with RUQ percutaneous drain with plans for general surgery follow up outpatient.   Today daughter came to pick her up to take her to  [facility] for rehab. Noticed that she still had generalized weakness and abdominal breathing. By the time patient reached the facility she had labored respirations and daughter reports that her blood pressure and O2 levels were low. Patient also mentioned that she passed out during transport. Per ED documentation her O2 saturation were at 88% on EMS arrival and she was placed on nonrebreather. She reports several days of new cough with white foamy sputum while inpatient.  In the ED, she was hypothermic to 37F, normotensive and presented on 10L via NRB now weaned to 3L.   Had worsening leukocytes to 15K, Hgb stable from prior at 9. Mild hyponatremia at 134, K of 5.1, worsening creatinine of 2.5 from prior of 2.41.  VBG shows metabolic acidosis with pH of 7.193, CO2 of 47 and bicarb of 18. No elevated anion gap.   Troponin elevated but flat at 34-32. BNP of 2041. EKG on my review with slight ST depression to inferior leads but not significantly changed from prior.   CTA chest obtained negative for pulmonary embolism however has small to moderate-sized bilateral pleural effusion right greater than left.  CT abdomen pelvis with moderate right and mild left hydroureteronephrosis with smooth tapering to the UVJ.  No obstructing stone.  Delayed bilateral nephrograms to suggest renal dysfunction.  Percutaneous drain in place in right upper quadrant.  She was started empirically on IV vancomycin and Cefepime and hospitalist consulted for admission.  Review of Systems: As mentioned in the history of present illness. All other systems reviewed and are negative. Past Medical History:  Diagnosis Date   Adrenal myelolipoma    Arthritis    CAD in native artery  CKD (chronic kidney disease)    HTN (hypertension)    Hypercholesterolemia    Past Surgical History:  Procedure Laterality Date   ADRENALECTOMY Right    BACK SURGERY     BALLOON DILATION N/A 06/19/2023   Procedure: BALLOON DILATION;   Surgeon: Lynann Bologna, MD;  Location: [facility] ENDOSCOPY;  Service: Gastroenterology;  Laterality: N/A;   BIOPSY  06/19/2023   Procedure: BIOPSY;  Surgeon: Lynann Bologna, MD;  Location: [facility] ENDOSCOPY;  Service: Gastroenterology;;   CARDIAC CATHETERIZATION  01/02/2009   The anterior descending artery is moderate  in size. There is an eccentric 60-70% proximal stenosis followed by 50-60% mid stenosis. EF 75-80%   CARDIOVASCULAR STRESS TEST  12/26/2008   EF 65%   CATARACT EXTRACTION     CHOLECYSTECTOMY N/A 06/21/2023   Procedure: LAPAROSCOPIC CHOLECYSTECTOMY;  Surgeon: Berna Bue, MD;  Location: [facility] OR;  Service: General;  Laterality: N/A;   COLONOSCOPY N/A 02/15/2017   Procedure: COLONOSCOPY;  Surgeon: Rachael Fee, MD;  Location: [facility] ENDOSCOPY;  Service: Endoscopy;  Laterality: N/A;   CYSTECTOMY     Right Hand   ERCP N/A 06/19/2023   Procedure: ENDOSCOPIC RETROGRADE CHOLANGIOPANCREATOGRAPHY (ERCP);  Surgeon: Lynann Bologna, MD;  Location: [facility] ENDOSCOPY;  Service: Gastroenterology;  Laterality: N/A;   ESOPHAGOGASTRODUODENOSCOPY N/A 06/19/2023   Procedure: ESOPHAGOGASTRODUODENOSCOPY (EGD);  Surgeon: Lynann Bologna, MD;  Location: [facility] ENDOSCOPY;  Service: Gastroenterology;  Laterality: N/A;   HEMORROIDECTOMY     REMOVAL OF STONES  06/19/2023   Procedure: REMOVAL OF STONES;  Surgeon: Lynann Bologna, MD;  Location: [facility] ENDOSCOPY;  Service: Gastroenterology;;   Dennison Mascot  06/19/2023   Procedure: Dennison Mascot;  Surgeon: Lynann Bologna, MD;  Location: [facility] ENDOSCOPY;  Service: Gastroenterology;;   TONSILLECTOMY     TOTAL ABDOMINAL HYSTERECTOMY     Social History:  reports that she has quit smoking. Her smoking use included cigarettes. She has a 5 pack-year smoking history. She has never used smokeless tobacco. She reports that she does not drink alcohol and does not use drugs.  Allergies  Allergen Reactions   Codeine Nausea And Vomiting    REACTION: nausea/vomiting   Oxaprozin     Other  Reaction(s): Unknown   Sulfonamide Derivatives Itching and Rash    Family History  Problem Relation Age of Onset   Diabetes Father    Diabetes Other    Cancer Maternal Uncle        unknown type cancer    CAD Neg Hx    Stroke Neg Hx     Prior to Admission medications   Medication Sig Start Date End Date Taking? Authorizing Provider  acetaminophen (TYLENOL) 325 MG tablet Take 2 tablets (650 mg total) by mouth every 6 (six) hours as needed for mild pain, moderate pain or fever. 06/28/23  Yes Hongalgi, Maximino Greenland, MD  amLODipine (NORVASC) 10 MG tablet Take 10 mg by mouth daily.    [provider]  aspirin EC 81 MG tablet Take 1 tablet (81 mg total) by mouth daily. 06/28/23   Hongalgi, Maximino Greenland, MD  atenolol (TENORMIN) 50 MG tablet Take 0.5 tablets (25 mg total) by mouth daily. 06/28/23   Hongalgi, Maximino Greenland, MD  bisacodyl (DULCOLAX) 10 MG suppository Place 1 suppository (10 mg total) rectally daily as needed for moderate constipation. 06/28/23   Hongalgi, Maximino Greenland, MD  CALCIUM-VITAMIN D PO Take 1 tablet by mouth daily.    [provider]  cholecalciferol (VITAMIN D) 400 units TABS tablet Take 400 Units by mouth daily.  [provider]  cyclobenzaprine (FLEXERIL) 10 MG tablet Take 10 mg by mouth daily as needed for muscle spasms. 06/12/17   [provider]  diclofenac sodium (VOLTAREN) 1 % GEL Apply 2 g topically daily as needed (pain). 03/15/18   [provider]  feeding supplement (ENSURE ENLIVE / ENSURE PLUS) LIQD Take 237 mLs by mouth 3 (three) times daily between meals for 10 days. 06/28/23 07/08/23  Hongalgi, Maximino Greenland, MD  fluticasone (FLONASE) 50 MCG/ACT nasal spray Place 2 sprays into both nostrils at bedtime.    [provider]  folic acid (FOLVITE) 1 MG tablet Take 1 tablet (1 mg total) by mouth daily. 06/29/23   Hongalgi, Maximino Greenland, MD  loratadine (CLARITIN) 10 MG tablet Take 10 mg by mouth daily.    [provider]  meclizine (ANTIVERT)  25 MG tablet Take 25 mg by mouth 3 (three) times daily as needed for dizziness.    [provider]  Multiple Vitamin (MULTIVITAMIN WITH MINERALS) TABS tablet Take 1 tablet by mouth daily.    [provider]  nitroGLYCERIN (NITROSTAT) 0.4 MG SL tablet Place 0.4 mg under the tongue every 5 (five) minutes as needed for chest pain.     [provider]  pantoprazole (PROTONIX) 40 MG tablet Take 1 tablet (40 mg total) by mouth 2 (two) times daily before a meal. Take 1 tab (40 mg total) twice daily x 7 weeks followed by 1 tab (40 mg total) daily thereafter. 06/28/23   Hongalgi, Maximino Greenland, MD  polyethylene glycol (MIRALAX / GLYCOLAX) 17 g packet Take 17 g by mouth daily. 06/29/23   Hongalgi, Maximino Greenland, MD  senna-docusate (SENOKOT-S) 8.6-50 MG tablet Take 1 tablet by mouth 2 (two) times daily. 06/28/23   Hongalgi, Maximino Greenland, MD  vitamin B-12 (CYANOCOBALAMIN) 1000 MCG tablet Take 1,000 mcg by mouth daily.     [provider]  vitamin E 400 UNIT capsule Take 400 Units by mouth daily.    [provider]    Physical Exam: Vitals:   06/29/23 0000 06/29/23 0015 06/29/23 0049 06/29/23 0115  BP: (!) 129/51 (!) 120/46  (!) 115/48  Pulse: 68 70  70  Resp: 18 (!) 23  (!) 25  Temp:   97.6 F (36.4 C)   TempSrc:   Oral   SpO2: 98% 99%  99%   Constitutional: NAD, calm, comfortable, elderly fatigued nontoxic appearing female laying upright in bed Eyes:  lids and conjunctivae normal ENMT: Mucous membranes are moist.  Neck: normal, supple Respiratory: Diminished bibasilar lung sounds.  Labored breathing at rest with abdominal accessory use.  On 3 L via nasal cannula.   Cardiovascular: Regular rate and rhythm, no murmurs / rubs / gallops. +2 pitting edema of bilateral lateral malleolus. 2+ pedal pulses.  Abdomen: Soft, nontender nondistended, percutaneous drain in place with serosanguineous fluid in drain musculoskeletal: no clubbing / cyanosis. No joint deformity upper and lower  extremities.  Skin: no rashes, lesions, ulcers. No induration Neurologic: CN 2-12 grossly intact.  Psychiatric: Normal judgment and insight. Alert and oriented x 3. Normal mood.   Data Reviewed:  See HPI  Assessment and Plan: * Acute respiratory failure with hypoxia (HCC) Bilateral pleural effusion -likely parapneumonic effusion with new cough, worsening leukocytosis, hypothermia and acute hypoxic respiratory failure on presentation. She also has bilateral LE edema but this is chronic and not worse from baseline. Last had echocardiogram on 06/20/23 with EF of 60-65% with no regional wall abnormalities. Diastolic parameters were  indeterminate. Moderate to severe aortic valve regurgitation. Doubt new onset CHF -will continue IV Vancomycin and Cefepime -blood cultures pending -Maintain O2 >94% and monitor on continuous pulse ox  AKI (acute kidney injury) (HCC) Acute on chronic kidney injury stage IV -worsening creatinine of 2.5 from prior of 2.41  -CT abdomen pelvis with moderate right and mild left hydroureteronephrosis with smooth tapering to the UVJ.  No obstructing stone.  Delayed bilateral nephrograms to suggest renal dysfunction.  Percutaneous drain in place in right upper quadrant. -hydroureteronephrosis is new since imaging last week. No urine output thus far in ED. Check bladder scan. Check renal ultrasound.  -hold all nephrotoxic agent. She did receive contrast for CTA chest in ED as ED provider had high suspicion for PE and consulted nephrology before proceeding with contrasted imaging. Would expect her creatinine to worsen on repeat lab work.    Hypothermia -presented with temperature of 10F -improving on Bair hugger -suspect secondary to infection likely pneumonia-blood culture pending  Chronic pain -continue hydrocodone-acetaminophen-unclear why this was discontinued at discharge -verified in [place] substance database and her prescriptions have been appropriately filled and  written by providers in the same clinic.  History of cholecystectomy -s/p Laparoscopic cholecystectomy 8/14 with general surgery Dr. Fredricka Bonine with percutaneous drain in place -abdominal exam is benign -continue daily drain care  Infrarenal abdominal aortic aneurysm (AAA) without rupture (HCC) - 3 cm infrarenal abdominal aortic aneurysm.  Recommend follow-up ultrasound in 3 years.  Metabolic acidosis VBG shows metabolic acidosis with pH of 7.193, CO2 of 47 and bicarb of 18. No elevated anion gap.  -secondary to worsening renal function  Essential hypertension -stable. Continue amlodipine and atenolol  Anemia -stable. Pt had recent hx of upper GI bleed from peptic ulcer -continue BID PPI -continue folic and vitamin B12 supplementation      Advance Care Planning:   Code Status: DNR -confirmed   Consults: none  Family Communication: daughter at bedside  Severity of Illness: The appropriate patient status for this patient is INPATIENT. Inpatient status is judged to be reasonable and necessary in order to provide the required intensity of service to ensure the patient's safety. The patient's presenting symptoms, physical exam findings, and initial radiographic and laboratory data in the context of their chronic comorbidities is felt to place them at high risk for further clinical deterioration. Furthermore, it is not anticipated that the patient will be medically stable for discharge from the hospital within 2 midnights of admission.   * I certify that at the point of admission it is my clinical judgment that the patient will require inpatient hospital care spanning beyond 2 midnights from the point of admission due to high intensity of service, high risk for further deterioration and high frequency of surveillance required.*  Author: Anselm Jungling, DO 06/29/2023 2:12 AM  For on call review [email or web address].

## 2023-06-29 NOTE — Assessment & Plan Note (Signed)
VBG shows metabolic acidosis with pH of 7.193, CO2 of 47 and bicarb of 18. No elevated anion gap.  -secondary to worsening renal function

## 2023-06-30 DIAGNOSIS — N179 Acute kidney failure, unspecified: Secondary | ICD-10-CM | POA: Diagnosis not present

## 2023-06-30 DIAGNOSIS — J9601 Acute respiratory failure with hypoxia: Secondary | ICD-10-CM

## 2023-06-30 DIAGNOSIS — I5033 Acute on chronic diastolic (congestive) heart failure: Secondary | ICD-10-CM | POA: Diagnosis not present

## 2023-06-30 DIAGNOSIS — E872 Acidosis, unspecified: Secondary | ICD-10-CM

## 2023-06-30 DIAGNOSIS — N189 Chronic kidney disease, unspecified: Secondary | ICD-10-CM

## 2023-06-30 LAB — BASIC METABOLIC PANEL
Anion gap: 14 (ref 5–15)
BUN: 48 mg/dL — ABNORMAL HIGH (ref 8–23)
CO2: 13 mmol/L — ABNORMAL LOW (ref 22–32)
Calcium: 8.1 mg/dL — ABNORMAL LOW (ref 8.9–10.3)
Chloride: 103 mmol/L (ref 98–111)
Creatinine, Ser: 2.93 mg/dL — ABNORMAL HIGH (ref 0.44–1.00)
GFR, Estimated: 15 mL/min — ABNORMAL LOW (ref >60)
Glucose, Bld: 64 mg/dL — ABNORMAL LOW (ref 70–99)
Potassium: 5 mmol/L (ref 3.5–5.1)
Sodium: 130 mmol/L — ABNORMAL LOW (ref 135–145)

## 2023-06-30 LAB — PROCALCITONIN: Procalcitonin: 0.31 ng/mL

## 2023-06-30 LAB — CBC WITH DIFFERENTIAL/PLATELET
Abs Immature Granulocytes: 0.18 10*3/uL — ABNORMAL HIGH (ref 0.00–0.07)
Basophils Absolute: 0 10*3/uL (ref 0.0–0.1)
Basophils Relative: 0 %
Eosinophils Absolute: 0.1 10*3/uL (ref 0.0–0.5)
Eosinophils Relative: 1 %
HCT: 27.8 % — ABNORMAL LOW (ref 36.0–46.0)
Hemoglobin: 8 g/dL — ABNORMAL LOW (ref 12.0–15.0)
Immature Granulocytes: 2 %
Lymphocytes Relative: 11 %
Lymphs Abs: 1.2 10*3/uL (ref 0.7–4.0)
MCH: 30.2 pg (ref 26.0–34.0)
MCHC: 28.8 g/dL — ABNORMAL LOW (ref 30.0–36.0)
MCV: 104.9 fL — ABNORMAL HIGH (ref 80.0–100.0)
Monocytes Absolute: 0.7 10*3/uL (ref 0.1–1.0)
Monocytes Relative: 6 %
Neutro Abs: 9.4 10*3/uL — ABNORMAL HIGH (ref 1.7–7.7)
Neutrophils Relative %: 80 %
Platelets: 194 10*3/uL (ref 150–400)
RBC: 2.65 MIL/uL — ABNORMAL LOW (ref 3.87–5.11)
RDW: 15.5 % (ref 11.5–15.5)
WBC: 11.5 10*3/uL — ABNORMAL HIGH (ref 4.0–10.5)
nRBC: 0.2 % (ref 0.0–0.2)

## 2023-06-30 MED ORDER — FUROSEMIDE 10 MG/ML IJ SOLN
60.0000 mg | Freq: Once | INTRAMUSCULAR | Status: AC
  Administered 2023-06-30: 60 mg via INTRAVENOUS
  Filled 2023-06-30: qty 6

## 2023-06-30 MED ORDER — ALBUMIN HUMAN 25 % IV SOLN
12.5000 g | Freq: Once | INTRAVENOUS | Status: AC
  Administered 2023-06-30: 12.5 g via INTRAVENOUS
  Filled 2023-06-30: qty 50

## 2023-07-03 LAB — CULTURE, BLOOD (ROUTINE X 2)
Culture: NO GROWTH
Culture: NO GROWTH
Special Requests: ADEQUATE

## 2023-07-09 NOTE — Progress Notes (Signed)
PROGRESS NOTE   Amy [name]  ZOX:[id number]    DOB: 1936-04-09    DOA: 06/26/2023  PCP: Geoffry Paradise, MD   I have briefly reviewed patients previous medical records in [facility].  Chief Complaint  Patient presents with   Shortness of Breath    Brief Narrative:  87 year old female with PMH of HTN, GERD, iron deficiency anemia, CKD stage IV, PUD, recent hospitalization 06/16/2023 - 06/21/2023 for acute on chronic kidney disease, UGI B due to peptic ulcer, choledocholithiasis, chronic cholecystitis, underwent ERCP that showed 8 mm prepyloric ulcer, also underwent sphincterotomy and sweeping of the biliary tree with extraction of stones, underwent laparoscopic cholecystectomy with LOA on 8/14, discharged with a postop RUQ drain, returned to the ED within a few hours due to generalized weakness, difficulty breathing, hypoxia with oxygen saturation of 88% by EMS requiring placement of NRBM.  In the ED noted to be hypothermic at 93 F, required 10 L/min NRB and then weaned to 3 L.  CTA chest was negative for PE but showed small to moderate-sized bilateral pleural effusion, right greater than left.  CT abdomen and pelvis showed moderate right and mild left hydroureteronephrosis with small tapering to the UVJ without obstructing stone.  She was admitted for acute respiratory failure with hypoxia, bilateral pleural effusions, acute kidney injury complicating stage IV kidney disease, hypothermia.  Since admission, creatinine has progressively worsened from 2.5-2.9, worsening of non-anion gap metabolic acidosis, significant volume overload/anasarca.  Will consult nephrology and cardiology.   Assessment & Plan:  Principal Problem:   Acute respiratory failure with hypoxia (HCC) Active Problems:   AKI (acute kidney injury) (HCC)   Hypothermia   Anemia   Essential hypertension   Metabolic acidosis   Infrarenal abdominal aortic aneurysm (AAA) without rupture (HCC)   History of cholecystectomy    Pleural effusion   Chronic pain   Healthcare-associated pneumonia   Acute respiratory failure with hypoxia: Per EMS, oxygen saturation 88% and required NRBM due to respiratory distress. Multifactorial due to bilateral pleural effusions, suspect acute diastolic CHF and anasarca. Oxygen support, titrate oxygen for saturations greater than 92%, incentive spirometry. If increased work of breathing and hypoxia do not improve with diuresis then may need to consider ultrasound-guided thoracentesis. Procalcitonin minimally elevated at 0.31, for now continue empirically started IV cefepime and vancomycin but low threshold to discontinue.  Venous lactate normal.  Anasarca/acute on chronic diastolic CHF TTE 8/11: LVEF 60-65%.  LV diastolic parameters were indeterminate.  Moderate to severe aortic valve regurgitation without aortic valve stenosis. Difficult situation and that patient has AKI complicating stage IV CKD but is significantly volume overloaded/anasarca with pulmonary edema and pleural effusions.  Diuretics may make her renal insufficiency worse. IV Lasix 60 mg x 1 and reassess.  Will also give a dose of albumin 12.5 g given that her albumin is 2.1 to mobilize fluids. Cardiology consultation. Strict intake output and daily weight  Moderate to severe AR and severe MR Await Cardiology input but maybe conservative Mx  Acute kidney injury complicating stage IV chronic kidney disease/non-anion gap metabolic acidosis No recent baseline creatinine other than 1.9 in 2018.  During recent admission had presented with creatinine of 3.45 which progressively improved to 2.41 on day of discharge Now presented with creatinine of 2.5 which has progressively worsened to 2.9, bicarbonate down to 13 Discontinued olmesartan during recent discharge Avoid hypotension and nephrotoxic's.  Blood pressures have been running soft, DC antihypertensives Did get CT contrast for A/P CT and CTA PE  protocol on admission  which could have certainly contributed to her worsening creatinine. CT A/P showed moderate right and mild left hydroureteronephrosis with smooth tapering to the UVJ but no obstructing stone. Renal ultrasound showed mild left and trace right hydronephrosis and medical renal disease.  Likely no intervention needed for the hydronephrosis. Nephrology consultation.  Essential hypertension: Soft pressures with SBP's in the 90s-100s. Discontinued atenolol and amlodipine.  Hypothermia: Presented with temperature of 20 F which improved with Bair hugger. Blood cultures x 2 negative to date. Not entirely sure if this is related to infectious etiology.  Had hypothermia again with rectal temperature of 92.3 last night. Check TSH and a.m. cortisol.  Last TSH was 1.364 in 2018. Treat supportively.  For now continue broad-spectrum IV antibiotics.  Hypoglycemia Blood glucose 64 on this morning's BMP.  Monitor CBGs and treat per protocol.  Macrocytic anemia: Hemoglobin with a 1 g drop but stable in the 8 g range.  Follow CBCs  Chronic pain Hydrocodone/acetaminophen was discontinued during recent discharge because she had not received these pain meds for several days and had not required PRNs. Judicious use of opioids as needed.  Choledocholithiasis, chronic cholelithiasis/laparoscopic cholecystectomy 8/14 Remains with RUQ drain.  Will alert surgeons. Abdominal exam benign.  Infrarenal AAA without rupture 3 cm infrarenal AAA, recommend follow-up ultrasound in 3 years  Recent upper GI bleed due to peptic ulcer Continue PPI.  No reported acute bleed.  Folate deficiency Continue folate supplements  Adult failure to thrive Due to very advanced age, frail physical health, multiple acute on chronic severe comorbidities. Could consider palliative care consultation at some point.    Body mass index is 28.95 kg/m.   Pressure Ulcer: Pressure Injury 06/29/23 Buttocks Left Stage 3 -  Full  thickness tissue loss. Subcutaneous fat may be visible but bone, tendon or muscle are NOT exposed. (Active)  06/29/23 0931  Location: Buttocks  Location Orientation: Left  Staging: Stage 3 -  Full thickness tissue loss. Subcutaneous fat may be visible but bone, tendon or muscle are NOT exposed.  Wound Description (Comments):   Present on Admission: Yes  Dressing Type Foam - Lift dressing to assess site every shift 06/29/23 0929    ACP Documents: None present DVT prophylaxis: enoxaparin (LOVENOX) injection 30 mg Start: 06/29/23 1000     Code Status: DNR:  Family Communication: None at bedside.  Attempted to reach daughter on the 2 listed numbers without success. Disposition:  Status is: Inpatient Remains inpatient appropriate because: IV meds and quite ill     Consultants:   Nephrology Cardiology  Procedures:   Still has RUQ postop drain since last discharge  Antimicrobials:   As noted above   Subjective:  Seen this morning along with patient's RN at bedside.  Looked at buttock area along with the nurse.  Patient says that she did not sleep well overnight due to generalized pain.  Reports some shortness of breath.  Objective:   Vitals:   07/05/2023 0400 07/01/2023 0748 06/22/2023 0800 06/23/2023 1000  BP:  (!) 112/48 (!) 107/38 (!) 91/52  Pulse:  71  71  Resp:  20 (!) 25 (!) 26  Temp:  97.7 F (36.5 C)  (!) 97.5 F (36.4 C)  TempSrc:  Oral  Axillary  SpO2:  95%  (!) 87%  Weight: 78.9 kg     Height:        General exam: Elderly female, moderately built, poorly nourished, chronic ill-looking, lying flat in bed (dressing changes ongoing) with  mild increased work of breathing. Respiratory system: Reduced breath sounds bilaterally, especially in the bases with few bibasal crackles.  No wheezing or rhonchi.  Mild increased work of breathing.  Per nursing, gurgly respirations unable to aspirate pink frothy material-small amount noticed in the wall canister. Cardiovascular system:  S1 & S2 heard, RRR. No JVD, murmurs, rubs, gallops or clicks.  Anasarca including edema of lower extremities up to the upper thighs, upper extremities Gastrointestinal system: Abdomen is nondistended, soft and nontender. No organomegaly or masses felt. Normal bowel sounds heard. Central nervous system: Alert and oriented x 2. No focal neurological deficits.  May be somewhat hard of hearing. Extremities: Symmetric 5 x 5 power. Skin: No rashes, lesions or ulcers Psychiatry: Judgement and insight appear normal. Mood & affect appropriate.     Data Reviewed:   I have personally reviewed following labs and imaging studies   CBC: Recent Labs  Lab 07/05/2023 1807 06/27/2023 1817 06/29/23 0359 06/25/2023 0324  WBC 15.1*  --  13.1* 11.5*  NEUTROABS 12.9*  --   --  9.4*  HGB 9.0* 9.2* 8.2* 8.0*  HCT 30.6* 27.0* 27.6* 27.8*  MCV 102.7*  --  102.2* 104.9*  PLT 206  --  198 194    Basic Metabolic Panel: Recent Labs  Lab 06/24/23 0604 06/25/23 0629 06/26/23 0315 06/08/2023 0332 06/14/2023 1807 07/08/2023 1817 06/29/23 0359 06/14/2023 0324  NA 133*   < > 134* 133* 134* 135 131* 130*  K 4.7   < > 4.7 4.6 5.1 4.9 4.9 5.0  CL 99   < > 104 106 107  --  105 103  CO2 26   < > 23 17* 17*  --  16* 13*  GLUCOSE 105*   < > 93 89 113*  --  87 64*  BUN 38*   < > 44* 42* 42*  --  42* 48*  CREATININE 2.47*   < > 2.48* 2.41* 2.50*  --  2.63* 2.93*  CALCIUM 8.0*   < > 8.0* 7.9* 8.3*  --  8.1* 8.1*  MG 2.5*  --   --  1.9  --   --   --   --    < > = values in this interval not displayed.    Liver Function Tests: Recent Labs  Lab 06/27/2023 1807  AST 16  ALT 6  ALKPHOS 55  BILITOT 0.3  PROT 5.6*  ALBUMIN 2.1*    CBG: Recent Labs  Lab 06/24/23 1654 06/25/23 0537 06/25/23 0807  GLUCAP 92 79 116*    Microbiology Studies:   Recent Results (from the past 240 hour(s))  Surgical pcr screen     Status: None   Collection Time: 06/20/23 11:05 PM   Specimen: Nasal Mucosa; Nasal Swab  Result Value Ref  Range Status   MRSA, PCR NEGATIVE NEGATIVE Final   Staphylococcus aureus NEGATIVE NEGATIVE Final    Comment: (NOTE) The Xpert SA Assay (FDA approved for NASAL specimens in patients 19 years of age and older), is one component of a comprehensive surveillance program. It is not intended to diagnose infection nor to guide or monitor treatment. Performed at [facility], [address] [place], [place] [address]   Blood culture (routine x 2)     Status: None (Preliminary result)   Collection Time: 07/07/2023  7:37 PM   Specimen: BLOOD RIGHT ARM  Result Value Ref Range Status   Specimen Description BLOOD RIGHT ARM  Final   Special Requests  Final    BOTTLES DRAWN AEROBIC AND ANAEROBIC Blood Culture adequate volume   Culture   Final    NO GROWTH 2 DAYS Performed at [facility], [address], [place], [place] [address]    Report Status PENDING  Incomplete  Blood culture (routine x 2)     Status: None (Preliminary result)   Collection Time: 06/27/2023  7:37 PM   Specimen: BLOOD LEFT ARM  Result Value Ref Range Status   Specimen Description BLOOD LEFT ARM  Final   Special Requests   Final    BOTTLES DRAWN AEROBIC AND ANAEROBIC Blood Culture results may not be optimal due to an inadequate volume of blood received in culture bottles   Culture   Final    NO GROWTH 2 DAYS Performed at [facility], [address] [place], [place] [address]    Report Status PENDING  Incomplete  MRSA Next Gen by PCR, Nasal     Status: None   Collection Time: 06/16/2023  7:38 PM   Specimen: Nasal Mucosa; Nasal Swab  Result Value Ref Range Status   MRSA by PCR Next Gen NOT DETECTED NOT DETECTED Final    Comment: (NOTE) The GeneXpert MRSA Assay (FDA approved for NASAL specimens only), is one component of a comprehensive MRSA colonization surveillance program. It is not intended to diagnose MRSA infection nor to guide or monitor treatment for MRSA infections. Test performance is not FDA  approved in patients less than 1 years old. Performed at [facility], [address], [place], [place] [address]     Radiology Studies:  US RENAL  Result Date: 07/07/2023 CLINICAL DATA:  Hydronephrosis EXAM: RENAL / URINARY TRACT ULTRASOUND COMPLETE COMPARISON:  CT 06/16/2023 FINDINGS: Right Kidney: Renal measurements: 9.3 x 5.0 x 5.0 cm = volume: 121 mL. There is mild diffuse renal cortical atrophy. Normal renal cortical echogenicity. Trace hydronephrosis. No intrarenal masses or calcifications are seen. Left Kidney: Renal measurements: 8.4 x 4.3 x 4.0 cm = volume: 77 mL. There is asymmetric, moderate diffuse renal cortical atrophy as well as mildly increased diffuse renal cortical echogenicity suggesting underlying medical renal disease. Mild left hydronephrosis is present. At least 2 simple cortical cysts are identified measuring up to 19 mm within the interpolar region. No follow-up imaging is recommended for these lesions. No solid intrarenal masses or calcifications are seen. Bladder: The bladder is moderately distended. No intraluminal masses are seen. Other: None. IMPRESSION: 1. Asymmetric, moderate left and mild right renal cortical atrophy. 2. Mild left and trace right hydronephrosis. 3. Mildly increased diffuse renal cortical echogenicity suggesting underlying medical renal disease. Electronically Signed   By: Helyn Numbers M.D.   On: 07-Jul-2023 03:31   CT ABDOMEN PELVIS W CONTRAST  Result Date: 07/04/2023 CLINICAL DATA:  Shortness of breath and abdominal pain EXAM: CT ABDOMEN AND PELVIS WITH CONTRAST TECHNIQUE: Multidetector CT imaging of the abdomen and pelvis was performed using the standard protocol following bolus administration of intravenous contrast. RADIATION DOSE REDUCTION: This exam was performed according to the departmental dose-optimization program which includes automated exposure control, adjustment of the mA and/or kV according to patient size and/or use of iterative  reconstruction technique. CONTRAST:  60mL OMNIPAQUE IOHEXOL 350 MG/ML SOLN COMPARISON:  CT abdomen and pelvis 06/16/2023 FINDINGS: Lower chest: Moderate bilateral pleural effusions and associated atelectasis. Hepatobiliary: Unremarkable liver. Cholecystectomy. Percutaneous drain in the right upper quadrant. Pneumobilia. No biliary dilation. Pancreas: Unremarkable. Spleen: Unremarkable. Adrenals/Urinary Tract: Stable partially calcified left adrenal  gland. No follow-up recommended. Right adrenalectomy. No urinary calculi. Moderate right and mild left hydroureteronephrosis with smooth tapering to the UVJ. Delayed bilateral nephrograms no obstructing stone. Unremarkable bladder. Stomach/Bowel: Normal caliber large and small bowel. Partially and wall thickening versus underdistention of the duodenum. There may be some mild adjacent Peri duodenal stranding. Colonic diverticulosis without diverticulitis. The appendix is normal.Stomach is within normal limits. Vascular/Lymphatic: Aortic atherosclerosis. 3.0 cm infrarenal abdominal aortic aneurysm. No enlarged abdominal or pelvic lymph nodes. Reproductive: Unremarkable. Other: Trace free fluid in the pelvis. No free intraperitoneal air. Musculoskeletal: No acute fracture.  Body wall edema. IMPRESSION: 1. Question duodenitis versus underdistention. 2. Moderate right and mild left hydroureteronephrosis with smooth tapering to the UVJ. No obstructing stone. Delayed bilateral nephrograms suggesting renal dysfunction. 3. Moderate bilateral pleural effusions and associated atelectasis. 4. Percutaneous drain in the right upper quadrant. 5. 3.0 cm infrarenal abdominal aortic aneurysm. Recommend follow-up ultrasound every 3 years. Body wall anasarca. Aortic Atherosclerosis (ICD10-I70.0). Electronically Signed   By: Minerva Fester M.D.   On: 06/21/2023 22:56   CT Angio Chest PE W and/or Wo Contrast  Result Date: 06/22/2023 CLINICAL DATA:  Shortness of breath EXAM: CT ANGIOGRAPHY  CHEST WITH CONTRAST TECHNIQUE: Multidetector CT imaging of the chest was performed using the standard protocol during bolus administration of intravenous contrast. Multiplanar CT image reconstructions and MIPs were obtained to evaluate the vascular anatomy. RADIATION DOSE REDUCTION: This exam was performed according to the departmental dose-optimization program which includes automated exposure control, adjustment of the mA and/or kV according to patient size and/or use of iterative reconstruction technique. CONTRAST:  60mL OMNIPAQUE IOHEXOL 350 MG/ML SOLN COMPARISON:  Chest CT 02/13/2018 FINDINGS: Cardiovascular: Satisfactory opacification of the pulmonary arteries to the segmental level. No evidence of pulmonary embolism. Normal heart size. No pericardial effusion. There severe atherosclerotic calcifications of the aorta and coronary arteries. Mediastinum/Nodes: There is a hyperdense right thyroid nodule measuring 9 mm. There is an inferior right thyroid nodule at the isthmus measuring 16 mm. There are no enlarged mediastinal or hilar lymph nodes. The esophagus is within normal limits. There is a small hiatal hernia. Lungs/Pleura: There are small to moderate-sized bilateral pleural effusions, right greater than left. There is compressive atelectasis of the bilateral lower lobes. There is no pneumothorax. Upper Abdomen: Catheter is seen in the right upper quadrant. Right adrenalectomy changes are present. Pneumobilia is present. Musculoskeletal: Degenerative changes affect the spine. Review of the MIP images confirms the above findings. IMPRESSION: 1. No evidence for pulmonary embolism. 2. Small to moderate-sized bilateral pleural effusions, right greater than left. 3. Compressive atelectasis of the bilateral lower lobes. 4. Incidental right thyroid nodule measuring 1.6 cm. Recommend non-emergent thyroid ultrasound if clinically warranted given patient age. Reference: J Am Coll Radiol. 2015 Feb;12(2): 143-50 Aortic  Atherosclerosis (ICD10-I70.0). Electronically Signed   By: Darliss Cheney M.D.   On: 06/08/2023 22:45   DG Chest Portable 1 View  Result Date: 06/19/2023 CLINICAL DATA:  Shortness of breath EXAM: PORTABLE CHEST 1 VIEW COMPARISON:  Chest radiograph dated 09/24/2010 FINDINGS: Normal lung volumes. Bilateral interstitial opacities. Dense left retrocardiac opacity. Blunting of the bilateral costophrenic angles. No pneumothorax. The heart size and mediastinal contours are within normal limits. No acute osseous abnormality. IMPRESSION: 1. Bilateral interstitial opacities, likely pulmonary edema, with small bilateral pleural effusions. 2. Dense left retrocardiac opacity, likely atelectasis. Aspiration or pneumonia can be considered in the appropriate clinical setting. Electronically Signed   By: Agustin Cree M.D.   On: 06/24/2023 18:22  Scheduled Meds:    amLODipine  10 mg Oral Daily   aspirin EC  81 mg Oral Daily   atenolol  25 mg Oral Daily   cholecalciferol  1,000 Units Oral Daily   cyanocobalamin  1,000 mcg Oral Daily   enoxaparin (LOVENOX) injection  30 mg Subcutaneous Daily   folic acid  1 mg Oral Daily   Gerhardt's butt cream   Topical BID   loratadine  10 mg Oral Daily   melatonin  3 mg Oral QHS   pantoprazole  40 mg Oral BID AC    Continuous Infusions:    ceFEPime (MAXIPIME) IV 2 g (06/29/23 2108)   vancomycin       LOS: 1 day     Marcellus Scott, MD,  FACP, FHM, [facility], [facility], [facility]   [facility] [place]      To contact the attending provider between 7A-7P or the covering provider during after hours 7P-7A, please log into the web site [email or web address] and access using universal [place] password for that web site. If you do not have the password, please call the hospital operator.  06/27/2023, 10:26 AM

## 2023-07-09 NOTE — Progress Notes (Signed)
Dr Waymon Amato at bedside, notified of pulmonary congestion, new orders pending.

## 2023-07-09 NOTE — TOC Initial Note (Signed)
Transition of Care [facility]) - Initial/Assessment Note    Patient Details  Name: Amy [name] MRN: [id number] Date of Birth: April 21, 1936  Transition of Care [facility]) CM/SW Contact:    Deatra Robinson, LCSW Phone Number: 07/02/2023, 9:49 AM  Clinical Narrative: pt admitted from [facility] where she is a [facility] resident. Spoke to pt's dtr Para March [phone] who voiced concern re pt being discharged from the hospital too soon. Dtr plans for pt to return to [facility] at [employer]. Message sent to [facility], await response to confirm pt's ability to return. Will need PT/OT orders when appropriate and new auth for return to SNF.   Dellie Burns, MSW, LCSW [phone] (coverage)                  Expected Discharge Plan: Skilled Nursing Facility Barriers to Discharge: Continued Medical Work up, English as a second language teacher   Patient Goals and CMS Choice            Expected Discharge Plan and Services       Living arrangements for the past 2 months: Skilled Nursing Facility, Single Family Home                                      Prior Living Arrangements/Services Living arrangements for the past 2 months: Skilled Nursing Facility, Single Family Home Lives with:: Facility Resident Patient language and need for interpreter reviewed:: No        Need for Family Participation in Patient Care: Yes (Comment) Care giver support system in place?: Yes (comment)   Criminal Activity/Legal Involvement Pertinent to Current Situation/Hospitalization: No - Comment as needed  Activities of Daily Living      Permission Sought/Granted                  Emotional Assessment       Orientation: : Oriented to Self, Oriented to Place, Oriented to  Time, Oriented to Situation Alcohol / Substance Use: Not Applicable Psych Involvement: No (comment)  Admission diagnosis:  Hypoxia [R09.02] Acute respiratory failure with hypoxia (HCC) [J96.01] Hypothermia, initial  encounter [T68.XXXA] Patient Active Problem List   Diagnosis Date Noted   Acute respiratory failure with hypoxia (HCC) 06/29/2023   Infrarenal abdominal aortic aneurysm (AAA) without rupture (HCC) 06/29/2023   Hypothermia 06/29/2023   History of cholecystectomy 06/29/2023   Pleural effusion 06/29/2023   Chronic pain 06/29/2023   Healthcare-associated pneumonia 06/29/2023   Acute gastric ulcer 06/20/2023   Cholelithiasis with choledocholithiasis 06/18/2023   Loss of weight 06/18/2023   Hyperkalemia 06/17/2023   Hyponatremia 06/17/2023   Metabolic acidosis 06/17/2023   Gastritis 06/17/2023   Heme positive stool 06/17/2023   Acute kidney injury (HCC) 06/17/2023   Anemia of chronic disease 05/08/2017   Iron deficiency anemia due to chronic blood loss 04/11/2017   Ulcer, colon    AKI (acute kidney injury) (HCC) 02/13/2017   Anemia 02/13/2017   Abnormal ECG 02/13/2017   Essential hypertension 02/13/2017   Leukocytosis 02/13/2017   Elevated vitamin B12 level 02/13/2017   PCP:  Geoffry Paradise, MD Pharmacy:   [facility] [address] [place], [place] - [address] [place] [address] Phone: [phone] Fax: [phone]     Social Determinants of Health (SDOH) Social History: SDOH Screenings   Food Insecurity: No Food Insecurity (06/17/2023)  Housing: Low Risk  (06/17/2023)  Transportation Needs: No Transportation  Needs (06/17/2023)  Utilities: Not At Risk (06/17/2023)  Tobacco Use: Medium Risk (07/04/2023)   SDOH Interventions:     Readmission Risk Interventions     No data to display

## 2023-07-09 NOTE — Death Summary Note (Addendum)
DEATH SUMMARY   Patient Details  Name: Amy [name] MRN: [id number] DOB: November 29, 1935 JXB:[id number], Gerlene Burdock, MD Admission/Discharge Information   Admit Date:  07-05-2023  Date of Death: Date of Death: July 07, 2023  Time of Death: Time of Death: 16-Jul-1135  Length of Stay: 1   Principle Cause of death: Acute on chronic diastolic CHF  Hospital Diagnoses: Principal Problem:   Acute respiratory failure with hypoxia (HCC) Active Problems:   AKI (acute kidney injury) (HCC)   Hypothermia   Anemia   Essential hypertension   Metabolic acidosis   Infrarenal abdominal aortic aneurysm (AAA) without rupture (HCC)   History of cholecystectomy   Pleural effusion   Chronic pain   Healthcare-associated pneumonia   Brief Narrative:  87 year old female with PMH of HTN, GERD, iron deficiency anemia, CKD stage IV, PUD, recent hospitalization 06/16/2023 - Jul 05, 2023 for acute on chronic kidney disease, UGI B due to peptic ulcer, choledocholithiasis, chronic cholecystitis, underwent ERCP that showed 8 mm prepyloric ulcer, also underwent sphincterotomy and sweeping of the biliary tree with extraction of stones, underwent laparoscopic cholecystectomy with LOA on 8/14, discharged with a postop RUQ drain, returned to the ED within a few hours due to generalized weakness, difficulty breathing, hypoxia with oxygen saturation of 88% by EMS requiring placement of NRBM.  In the ED noted to be hypothermic at 93 F, required 10 L/min NRB and then weaned to 3 L.  CTA chest was negative for PE but showed small to moderate-sized bilateral pleural effusion, right greater than left.  CT abdomen and pelvis showed moderate right and mild left hydroureteronephrosis with small tapering to the UVJ without obstructing stone.  She was admitted for acute respiratory failure with hypoxia, bilateral pleural effusions, acute kidney injury complicating stage IV kidney disease, hypothermia.  Since admission, creatinine had progressively worsened  from 2.5-2.9, worsening of non-anion gap metabolic acidosis, significant volume overload/anasarca and worsening dyspnea with work of breathing.    Patient was seen this morning, interviewed and examined along with her female nurse.  She was given a dose of IV Lasix.  I had discussed with and consulted both nephrology and cardiology to evaluate her.  As per cardiology who reviewed her TTE, patient had severe MR in addition to moderate to severe AR and clearly had diastolic dysfunction.  She would not have been a candidate for a clip or any intervention for the valvular dysfunction.  Shortly thereafter, nursing reached out to me indicating that patient was "dying".  I was a couple rooms down and immediately proceeded to patient's bedside and patient was clearly apneic and dying and may be taking her last breath or two.  Nursing confirmed that she was a DNR.  They had already called and alerted patient's daughter.  I do spoke with patient's daughter who was on the way to the hospital.  Patient subsequently demised.  I called the daughter again after she had reached the hospital, offered condolences, went over her care in detail and advised her that she was critically ill but was not suffering at the time of demise.  She was appreciative.  Suspect that she had acute respiratory failure with hypoxia due to acute on chronic diastolic CHF complicating underlying moderate to severe AR, severe MR.  Following were the events that were actively managed earlier this morning prior to her demise...  Also per nursing report, Medical Examiner was going to review her case. I called the [employer] today prior to completing the death certificate and she  confirmed that the ME reviewed and declined the case stating that death was from natural causes.     Assessment & Plan:    Acute respiratory failure with hypoxia: Per EMS, oxygen saturation 88% and required NRBM due to respiratory distress. Multifactorial due  to bilateral pleural effusions, suspect acute diastolic CHF and anasarca. Oxygen support, titrate oxygen for saturations greater than 92%, incentive spirometry. If increased work of breathing and hypoxia do not improve with diuresis then may need to consider ultrasound-guided thoracentesis. Procalcitonin minimally elevated at 0.31, for now continue empirically started IV cefepime and vancomycin but low threshold to discontinue.  Venous lactate normal.   Anasarca/acute on chronic diastolic CHF TTE 1/19: LVEF 60-65%.  LV diastolic parameters were indeterminate.  Moderate to severe aortic valve regurgitation without aortic valve stenosis. Difficult situation and that patient has AKI complicating stage IV CKD but is significantly volume overloaded/anasarca with pulmonary edema and pleural effusions.  Diuretics may make her renal insufficiency worse. IV Lasix 60 mg x 1 and reassess.  Will also give a dose of albumin 12.5 g given that her albumin is 2.1 to mobilize fluids. Cardiology consultation. Strict intake output and daily weight   Moderate to severe AR and severe MR Await Cardiology input but maybe conservative Mx   Acute kidney injury complicating stage IV chronic kidney disease/non-anion gap metabolic acidosis No recent baseline creatinine other than 1.9 in 2018.  During recent admission had presented with creatinine of 3.45 which progressively improved to 2.41 on day of discharge Now presented with creatinine of 2.5 which has progressively worsened to 2.9, bicarbonate down to 13 Discontinued olmesartan during recent discharge Avoid hypotension and nephrotoxic's.  Blood pressures have been running soft, DC antihypertensives Did get CT contrast for A/P CT and CTA PE protocol on admission which could have certainly contributed to her worsening creatinine. CT A/P showed moderate right and mild left hydroureteronephrosis with smooth tapering to the UVJ but no obstructing stone. Renal ultrasound  showed mild left and trace right hydronephrosis and medical renal disease.  Likely no intervention needed for the hydronephrosis. Nephrology consultation.   Essential hypertension: Soft pressures with SBP's in the 90s-100s. Discontinued atenolol and amlodipine.   Hypothermia: Presented with temperature of 3 F which improved with Bair hugger. Blood cultures x 2 negative to date. Not entirely sure if this is related to infectious etiology.  Had hypothermia again with rectal temperature of 92.3 last night. Check TSH and a.m. cortisol.  Last TSH was 1.364 in 2018. Treat supportively.  For now continue broad-spectrum IV antibiotics.   Hypoglycemia Blood glucose 64 on this morning's BMP.  Monitor CBGs and treat per protocol.   Macrocytic anemia: Hemoglobin with a 1 g drop but stable in the 8 g range.  Follow CBCs   Chronic pain Hydrocodone/acetaminophen was discontinued during recent discharge because she had not received these pain meds for several days and had not required PRNs. Judicious use of opioids as needed.   Choledocholithiasis, chronic cholelithiasis/laparoscopic cholecystectomy 8/14 Remains with RUQ drain.  Will alert surgeons. Abdominal exam benign.   Infrarenal AAA without rupture 3 cm infrarenal AAA, recommend follow-up ultrasound in 3 years   Recent upper GI bleed due to peptic ulcer Continue PPI.  No reported acute bleed.   Folate deficiency Continue folate supplements   Adult failure to thrive Due to very advanced age, frail physical health, multiple acute on chronic severe comorbidities. Could consider palliative care consultation at some point.       Body mass  index is 28.95 kg/m.     Pressure Ulcer:     Pressure Injury 06/29/23 Buttocks Left Stage 3 -  Full thickness tissue loss. Subcutaneous fat may be visible but bone, tendon or muscle are NOT exposed. (Active)  06/29/23 0931  Location: Buttocks  Location Orientation: Left  Staging: Stage 3 -   Full thickness tissue loss. Subcutaneous fat may be visible but bone, tendon or muscle are NOT exposed.  Wound Description (Comments):   Present on Admission: Yes  Dressing Type Foam - Lift dressing to assess site every shift 06/29/23 0929        Consultants:   Patient demise prior to be seen by the following consultants Nephrology Cardiology   Procedures:   Still has RUQ postop drain since last discharge    The results of significant diagnostics from this hospitalization (including imaging, microbiology, ancillary and laboratory) are listed below for reference.   Significant Diagnostic Studies: US RENAL  Result Date: 06/29/2023 CLINICAL DATA:  Hydronephrosis EXAM: RENAL / URINARY TRACT ULTRASOUND COMPLETE COMPARISON:  CT 07/06/2023 FINDINGS: Right Kidney: Renal measurements: 9.3 x 5.0 x 5.0 cm = volume: 121 mL. There is mild diffuse renal cortical atrophy. Normal renal cortical echogenicity. Trace hydronephrosis. No intrarenal masses or calcifications are seen. Left Kidney: Renal measurements: 8.4 x 4.3 x 4.0 cm = volume: 77 mL. There is asymmetric, moderate diffuse renal cortical atrophy as well as mildly increased diffuse renal cortical echogenicity suggesting underlying medical renal disease. Mild left hydronephrosis is present. At least 2 simple cortical cysts are identified measuring up to 19 mm within the interpolar region. No follow-up imaging is recommended for these lesions. No solid intrarenal masses or calcifications are seen. Bladder: The bladder is moderately distended. No intraluminal masses are seen. Other: None. IMPRESSION: 1. Asymmetric, moderate left and mild right renal cortical atrophy. 2. Mild left and trace right hydronephrosis. 3. Mildly increased diffuse renal cortical echogenicity suggesting underlying medical renal disease. Electronically Signed   By: Helyn Numbers M.D.   On: 06/29/2023 03:31   CT ABDOMEN PELVIS W CONTRAST  Result Date: 06/08/2023 CLINICAL DATA:   Shortness of breath and abdominal pain EXAM: CT ABDOMEN AND PELVIS WITH CONTRAST TECHNIQUE: Multidetector CT imaging of the abdomen and pelvis was performed using the standard protocol following bolus administration of intravenous contrast. RADIATION DOSE REDUCTION: This exam was performed according to the departmental dose-optimization program which includes automated exposure control, adjustment of the mA and/or kV according to patient size and/or use of iterative reconstruction technique. CONTRAST:  60mL OMNIPAQUE IOHEXOL 350 MG/ML SOLN COMPARISON:  CT abdomen and pelvis 06/16/2023 FINDINGS: Lower chest: Moderate bilateral pleural effusions and associated atelectasis. Hepatobiliary: Unremarkable liver. Cholecystectomy. Percutaneous drain in the right upper quadrant. Pneumobilia. No biliary dilation. Pancreas: Unremarkable. Spleen: Unremarkable. Adrenals/Urinary Tract: Stable partially calcified left adrenal gland. No follow-up recommended. Right adrenalectomy. No urinary calculi. Moderate right and mild left hydroureteronephrosis with smooth tapering to the UVJ. Delayed bilateral nephrograms no obstructing stone. Unremarkable bladder. Stomach/Bowel: Normal caliber large and small bowel. Partially and wall thickening versus underdistention of the duodenum. There may be some mild adjacent Peri duodenal stranding. Colonic diverticulosis without diverticulitis. The appendix is normal.Stomach is within normal limits. Vascular/Lymphatic: Aortic atherosclerosis. 3.0 cm infrarenal abdominal aortic aneurysm. No enlarged abdominal or pelvic lymph nodes. Reproductive: Unremarkable. Other: Trace free fluid in the pelvis. No free intraperitoneal air. Musculoskeletal: No acute fracture.  Body wall edema. IMPRESSION: 1. Question duodenitis versus underdistention. 2. Moderate right and mild left hydroureteronephrosis with smooth  tapering to the UVJ. No obstructing stone. Delayed bilateral nephrograms suggesting renal  dysfunction. 3. Moderate bilateral pleural effusions and associated atelectasis. 4. Percutaneous drain in the right upper quadrant. 5. 3.0 cm infrarenal abdominal aortic aneurysm. Recommend follow-up ultrasound every 3 years. Body wall anasarca. Aortic Atherosclerosis (ICD10-I70.0). Electronically Signed   By: Minerva Fester M.D.   On: 07/06/2023 22:56   CT Angio Chest PE W and/or Wo Contrast  Result Date: 07/04/2023 CLINICAL DATA:  Shortness of breath EXAM: CT ANGIOGRAPHY CHEST WITH CONTRAST TECHNIQUE: Multidetector CT imaging of the chest was performed using the standard protocol during bolus administration of intravenous contrast. Multiplanar CT image reconstructions and MIPs were obtained to evaluate the vascular anatomy. RADIATION DOSE REDUCTION: This exam was performed according to the departmental dose-optimization program which includes automated exposure control, adjustment of the mA and/or kV according to patient size and/or use of iterative reconstruction technique. CONTRAST:  60mL OMNIPAQUE IOHEXOL 350 MG/ML SOLN COMPARISON:  Chest CT 02/13/2018 FINDINGS: Cardiovascular: Satisfactory opacification of the pulmonary arteries to the segmental level. No evidence of pulmonary embolism. Normal heart size. No pericardial effusion. There severe atherosclerotic calcifications of the aorta and coronary arteries. Mediastinum/Nodes: There is a hyperdense right thyroid nodule measuring 9 mm. There is an inferior right thyroid nodule at the isthmus measuring 16 mm. There are no enlarged mediastinal or hilar lymph nodes. The esophagus is within normal limits. There is a small hiatal hernia. Lungs/Pleura: There are small to moderate-sized bilateral pleural effusions, right greater than left. There is compressive atelectasis of the bilateral lower lobes. There is no pneumothorax. Upper Abdomen: Catheter is seen in the right upper quadrant. Right adrenalectomy changes are present. Pneumobilia is present.  Musculoskeletal: Degenerative changes affect the spine. Review of the MIP images confirms the above findings. IMPRESSION: 1. No evidence for pulmonary embolism. 2. Small to moderate-sized bilateral pleural effusions, right greater than left. 3. Compressive atelectasis of the bilateral lower lobes. 4. Incidental right thyroid nodule measuring 1.6 cm. Recommend non-emergent thyroid ultrasound if clinically warranted given patient age. Reference: J Am Coll Radiol. 2015 Feb;12(2): 143-50 Aortic Atherosclerosis (ICD10-I70.0). Electronically Signed   By: Darliss Cheney M.D.   On: 06/27/2023 22:45   DG Chest Portable 1 View  Result Date: 06/22/2023 CLINICAL DATA:  Shortness of breath EXAM: PORTABLE CHEST 1 VIEW COMPARISON:  Chest radiograph dated 09/24/2010 FINDINGS: Normal lung volumes. Bilateral interstitial opacities. Dense left retrocardiac opacity. Blunting of the bilateral costophrenic angles. No pneumothorax. The heart size and mediastinal contours are within normal limits. No acute osseous abnormality. IMPRESSION: 1. Bilateral interstitial opacities, likely pulmonary edema, with small bilateral pleural effusions. 2. Dense left retrocardiac opacity, likely atelectasis. Aspiration or pneumonia can be considered in the appropriate clinical setting. Electronically Signed   By: Agustin Cree M.D.   On: 07/02/2023 18:22   DG Abd 1 View  Result Date: 06/24/2023 CLINICAL DATA:  Abdominal pain EXAM: ABDOMEN - 1 VIEW COMPARISON:  CT abdomen 06/16/2023 FINDINGS: Focal increased opacity limiting evaluation overlying the lower lumbar spine and sacrum likely secondary to technique. Gaseous distension of the colon. Right upper quadrant drain. Surgical clips in the right upper quadrant. No bowel dilatation to suggest obstruction. No evidence of pneumoperitoneum, portal venous gas or pneumatosis. No pathologic calcifications along the expected course of the ureters. No acute osseous abnormality. Lower lumbar spine spondylosis.  IMPRESSION: 1. Gaseous distension of the colon. No evidence of bowel obstruction. Electronically Signed   By: Elige Ko M.D.   On: 06/24/2023 10:37  VAS [place] ABI WITH/WO TBI  Result Date: 06/20/2023  LOWER EXTREMITY DOPPLER STUDY Patient Name:  MCKAILEY DEMAREST  Date of Exam:   06/20/2023 Medical Rec #: [id number]       Accession #:    [id number] Date of Birth: May 24, 1936       Patient Gender: F Patient Age:   87 years Exam Location:  [facility] Procedure:      VAS [place] ABI WITH/WO TBI Referring Phys: Calvert Cantor --------------------------------------------------------------------------------  Indications: Rest pain. High Risk Factors: Hypertension, past history of smoking.  Comparison Study: No previous study. Performing Technologist: McKayla Maag RVT, [place]  Examination Guidelines: A complete evaluation includes at minimum, Doppler waveform signals and systolic blood pressure reading at the level of bilateral brachial, anterior tibial, and posterior tibial arteries, when vessel segments are accessible. Bilateral testing is considered an integral part of a complete examination. Photoelectric Plethysmograph (PPG) waveforms and toe systolic pressure readings are included as required and additional duplex testing as needed. Limited examinations for reoccurring indications may be performed as noted.  ABI Findings: +---------+--------------+-----+-------------------+---------------------------+ Right    Rt Pressure   IndexWaveform           Comment                              (mmHg)                                                            +---------+--------------+-----+-------------------+---------------------------+ Brachial 154                biphasic                                       +---------+--------------+-----+-------------------+---------------------------+ PTA      255           1.66 monophasic         Non-compressible             +---------+--------------+-----+-------------------+---------------------------+ DP                          dampened monophasicWaveform obtained with 2D                                                  imaging. No pressure                                                       obtained.                   +---------+--------------+-----+-------------------+---------------------------+ Great Toe39            0.25 Abnormal                                       +---------+--------------+-----+-------------------+---------------------------+ +---------+------------------+-----+----------+----------------+  Left     Lt Pressure (mmHg)IndexWaveform  Comment          +---------+------------------+-----+----------+----------------+ PTA      255               1.66 monophasicNon-compressible +---------+------------------+-----+----------+----------------+ DP       96                0.62 monophasic                 +---------+------------------+-----+----------+----------------+ Great Toe57                0.37 Abnormal                   +---------+------------------+-----+----------+----------------+ +-------+----------------+-----------+------------+------------+ ABI/TBIToday's ABI     Today's TBIPrevious ABIPrevious TBI +-------+----------------+-----------+------------+------------+ Right  Non-compressible0.25                                +-------+----------------+-----------+------------+------------+ Left   Non-compressible0.37                                +-------+----------------+-----------+------------+------------+  Summary: Right: Resting right ankle-brachial index indicates noncompressible right lower extremity arteries. The right toe-brachial index is abnormal. Left: Resting left ankle-brachial index indicates noncompressible left lower extremity arteries. The left toe-brachial index is abnormal. *See table(s) above for measurements and  observations.  Electronically signed by Coral Else MD on 06/20/2023 at 9:06:27 PM.    Final    ECHOCARDIOGRAM COMPLETE  Result Date: 06/20/2023    ECHOCARDIOGRAM REPORT   Patient Name:   KASARAH MORENO Date of Exam: 06/20/2023 Medical Rec #:  [id number]      Height:       65.0 in Accession #:    [id number]     Weight:       139.6 lb Date of Birth:  [birth year]      BSA:          1.698 m Patient Age:    87 years       BP:           149/58 mmHg Patient Gender: F              HR:           82 bpm. Exam Location:  Inpatient Procedure: 2D Echo, Cardiac Doppler and Color Doppler Indications:    Pre-Op  History:        Patient has no prior history of Echocardiogram examinations.                 CAD; Risk Factors:Hypertension.  Sonographer:    Darlys Gales Referring Phys: Leone Brand IMPRESSIONS  1. Left ventricular ejection fraction, by estimation, is 60 to 65%. The left ventricle has normal function. The left ventricle has no regional wall motion abnormalities. Left ventricular diastolic parameters are indeterminate.  2. Right ventricular systolic function is normal. The right ventricular size is normal.  3. The mitral valve is degenerative. No evidence of mitral valve regurgitation. Mild mitral stenosis. The mean mitral valve gradient is 7.0 mmHg with average heart rate of 82 bpm. Severe mitral annular calcification.  4. The aortic valve was not well visualized. Aortic valve regurgitation is moderate to severe. Aortic valve sclerosis is present, with no evidence of aortic valve stenosis. Aortic regurgitation PHT measures 300 msec. Comparison(s): No prior Echocardiogram. FINDINGS  Left Ventricle: Left  ventricular ejection fraction, by estimation, is 60 to 65%. The left ventricle has normal function. The left ventricle has no regional wall motion abnormalities. The left ventricular internal cavity size was normal in size. There is  no left ventricular hypertrophy. Left ventricular diastolic parameters are  indeterminate. Right Ventricle: The right ventricular size is normal. No increase in right ventricular wall thickness. Right ventricular systolic function is normal. Left Atrium: Left atrial size was normal in size. Right Atrium: Right atrial size was normal in size. Pericardium: Trivial pericardial effusion is present. Mitral Valve: 2D Mitral valve area 3.48 cm2. The mitral valve is degenerative in appearance. Severe mitral annular calcification. No evidence of mitral valve regurgitation. Mild mitral valve stenosis. MV peak gradient, 10.2 mmHg. The mean mitral valve gradient is 7.0 mmHg with average heart rate of 82 bpm. Tricuspid Valve: The tricuspid valve is normal in structure. Tricuspid valve regurgitation is not demonstrated. No evidence of tricuspid stenosis. Aortic Valve: The aortic valve was not well visualized. Aortic valve regurgitation is moderate to severe. Aortic regurgitation PHT measures 300 msec. Aortic valve sclerosis is present, with no evidence of aortic valve stenosis. Aortic valve mean gradient  measures 6.0 mmHg. Aortic valve peak gradient measures 10.4 mmHg. Aortic valve area, by VTI measures 2.00 cm. Pulmonic Valve: The pulmonic valve was normal in structure. Pulmonic valve regurgitation is mild. No evidence of pulmonic stenosis. Aorta: The aortic root is normal in size and structure. IAS/Shunts: The interatrial septum was not well visualized.  LEFT VENTRICLE PLAX 2D LVIDd:         4.30 cm   Diastology LVIDs:         3.00 cm   LV e' medial:    5.98 cm/s LV PW:         1.00 cm   LV E/e' medial:  25.4 LV IVS:        0.80 cm   LV e' lateral:   5.66 cm/s LVOT diam:     1.80 cm   LV E/e' lateral: 26.9 LV SV:         84 LV SV Index:   49 LVOT Area:     2.54 cm  RIGHT VENTRICLE RV S prime:     14.80 cm/s TAPSE (M-mode): 2.2 cm LEFT ATRIUM             Index        RIGHT ATRIUM           Index LA Vol (A2C):   41.8 ml 24.62 ml/m  RA Area:     13.70 cm LA Vol (A4C):   35.7 ml 21.03 ml/m  RA  Volume:   30.20 ml  17.79 ml/m LA Biplane Vol: 40.0 ml 23.56 ml/m  AORTIC VALVE AV Area (Vmax):    2.21 cm AV Area (Vmean):   1.96 cm AV Area (VTI):     2.00 cm AV Vmax:           161.00 cm/s AV Vmean:          122.000 cm/s AV VTI:            0.420 m AV Peak Grad:      10.4 mmHg AV Mean Grad:      6.0 mmHg LVOT Vmax:         140.00 cm/s LVOT Vmean:        94.100 cm/s LVOT VTI:          0.330 m LVOT/AV VTI ratio: 0.79 AI  PHT:            300 msec  AORTA Ao Root diam: 1.90 cm Ao Asc diam:  2.60 cm MITRAL VALVE                TRICUSPID VALVE MV Area (PHT): 3.42 cm     TR Peak grad:   32.9 mmHg MV Area VTI:   2.14 cm     TR Vmax:        287.00 cm/s MV Peak grad:  10.2 mmHg MV Mean grad:  7.0 mmHg     SHUNTS MV Vmax:       1.60 m/s     Systemic VTI:  0.33 m MV Vmean:      125.3 cm/s   Systemic Diam: 1.80 cm MV Decel Time: 222 msec MV E velocity: 152.00 cm/s MV A velocity: 148.00 cm/s MV E/A ratio:  1.03 Riley Lam MD Electronically signed by Riley Lam MD Signature Date/Time: 06/20/2023/3:59:58 PM    Final    DG Foot Complete Right  Result Date: 06/19/2023 CLINICAL DATA:  Right foot pain EXAM: RIGHT FOOT COMPLETE - 3+ VIEW COMPARISON:  None Available. FINDINGS: Degenerative changes in the interphalangeal joints, first metatarsal-phalangeal joint, and intertarsal joints. No evidence of acute fracture or dislocation. No focal bone lesion or bone destruction. 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 Soft tissues are unremarkable. IMPRESSION: No acute bony abnormalities.  Mild degenerative changes. Electronically Signed   By: Burman Nieves M.D.   On: 06/19/2023 21:47   DG ERCP  Result Date: 06/19/2023 CLINICAL DATA:  ERCP EXAM: ERCP TECHNIQUE: Multiple spot images obtained with the fluoroscopic device and submitted for interpretation post-procedure. FLUOROSCOPY: Refer to separate report COMPARISON:  None Available. FINDINGS: A total of 13 fluoroscopic spot images  obtained during ERCP are submitted for review. Initial images demonstrate a scope overlying the upper abdomen. Surgical clips are present. Wire catheterization and contrast injection of the common bile duct is performed. There appear to be multiple small filling defects best identified on image 7. Balloon sphincterotomy of the ampulla followed by balloon sweeps of the common bile duct appear to be performed. IMPRESSION: ERCP images as described. Refer to dedicated procedure report for full details. These images were submitted for radiologic interpretation only. Please see the procedural report for the amount of contrast and the fluoroscopy time utilized. Electronically Signed   By: Olive Bass M.D.   On: 06/19/2023 15:55   CT ABDOMEN PELVIS WO CONTRAST  Result Date: 06/16/2023 CLINICAL DATA:  Abdominal pain, acute, nonlocalized EXAM: CT ABDOMEN AND PELVIS WITHOUT CONTRAST TECHNIQUE: Multidetector CT imaging of the abdomen and pelvis was performed following the standard protocol without IV contrast. RADIATION DOSE REDUCTION: This exam was performed according to the departmental dose-optimization program which includes automated exposure control, adjustment of the mA and/or kV according to patient size and/or use of iterative reconstruction technique. COMPARISON:  CT abdomen pelvis 08/07/2020, CT abdomen pelvis 02/13/2017 FINDINGS: Lower chest: Trace hiatal hernia.  No acute abnormality. Hepatobiliary: No focal liver abnormality. Calcified gallstone noted within the gallbladder lumen. Persistent several calcified gallstones within the common bile duct with interval increase in size of an enlarged common bile duct measuring up to 16 mm. No gallbladder wall thickening or pericholecystic fluid. Limited evaluation of the intrahepatic biliary ducts on this noncontrast study. Pancreas: Diffusely atrophic. No focal lesion. Otherwise normal pancreatic contour. No surrounding inflammatory changes. No main pancreatic  ductal dilatation. Spleen: Normal in size without focal  abnormality.  Splenule noted. Adrenals/Urinary Tract: Status post right adrenalectomy. Coarse calcifications of the left adrenal gland likely sequelae of prior infection/hemorrhage-chronic no further follow-up indicated. No nephrolithiasis and no hydronephrosis. Bilateral renal cortical scarring. Fluid density lesions within the kidneys likely represent simple renal cysts. Chronic 1.1 cm hyperdense right renal lesion with a density of 79 Hounsfield units likely hemorrhagic or proteinaceous cyst-no further follow-up indicated. No ureterolithiasis or hydroureter. The urinary bladder is unremarkable. Stomach/Bowel: Stomach is within normal limits. No evidence of bowel wall thickening or dilatation. Colonic diverticulosis. Appendix appears normal. Vascular/Lymphatic: No abdominal aorta or iliac aneurysm. Severe atherosclerotic plaque of the aorta and its branches. No abdominal, pelvic, or inguinal lymphadenopathy. Reproductive: Status post hysterectomy. No adnexal masses. Other: No intraperitoneal free fluid. No intraperitoneal free gas. No organized fluid collection. Musculoskeletal: No abdominal wall hernia or abnormality. No suspicious lytic or blastic osseous lesions. No acute displaced fracture. 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 Mild retrolisthesis of L1 on L2 and L2 on L3. IMPRESSION: 1. Persistent cholelithiasis and persistent choledocholithiasis with slightly increased in size common bile duct dilatation. No CT finding of acute cholecystitis. Limited evaluation on this noncontrast study. 2. 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 3. Trace hiatal hernia. Electronically Signed   By: Tish Frederickson M.D.   On: 06/16/2023 22:37    Microbiology: Recent Results  (from the past 240 hour(s))  Surgical pcr screen     Status: None   Collection Time: 06/20/23 11:05 PM   Specimen: Nasal Mucosa; Nasal Swab  Result Value Ref Range Status   MRSA, PCR NEGATIVE NEGATIVE Final   Staphylococcus aureus NEGATIVE NEGATIVE Final    Comment: (NOTE) The Xpert SA Assay (FDA approved for NASAL specimens in patients 58 years of age and older), is one component of a comprehensive surveillance program. It is not intended to diagnose infection nor to guide or monitor treatment. Performed at [facility], [address], [place], [place] [address]   Blood culture (routine x 2)     Status: None (Preliminary result)   Collection Time: 06/22/2023  7:37 PM   Specimen: BLOOD RIGHT ARM  Result Value Ref Range Status   Specimen Description BLOOD RIGHT ARM  Final   Special Requests   Final    BOTTLES DRAWN AEROBIC AND ANAEROBIC Blood Culture adequate volume   Culture   Final    NO GROWTH 2 DAYS Performed at [facility], [address], [place], [place] [address]    Report Status PENDING  Incomplete  Blood culture (routine x 2)     Status: None (Preliminary result)   Collection Time: 07/05/2023  7:37 PM   Specimen: BLOOD LEFT ARM  Result Value Ref Range Status   Specimen Description BLOOD LEFT ARM  Final   Special Requests   Final    BOTTLES DRAWN AEROBIC AND ANAEROBIC Blood Culture results may not be optimal due to an inadequate volume of blood received in culture bottles   Culture   Final    NO GROWTH 2 DAYS Performed at [facility], [address], [place], [place] [address]    Report Status PENDING  Incomplete  MRSA Next Gen by PCR, Nasal     Status: None   Collection Time: 06/20/2023  7:38 PM   Specimen: Nasal Mucosa; Nasal Swab  Result Value Ref Range Status   MRSA by PCR Next Gen NOT DETECTED NOT DETECTED Final    Comment: (NOTE) The GeneXpert MRSA Assay (FDA approved for NASAL specimens only), is one component of a comprehensive MRSA  colonization surveillance program. It is not intended to diagnose MRSA infection nor to guide or monitor treatment for MRSA infections. Test performance is not FDA approved in patients less than 65 years old. Performed at [facility], [address] [place], [place] [address]     Time spent: 15 minutes  Marcellus Scott, MD,  FACP, [facility], [facility], [facility], [facility]   [facility]      To contact the attending provider between 7A-7P or the covering provider during after hours 7P-7A, please log into the web site [email or web address] and access using universal  password for that web site. If you do not have the password, please call the hospital operator.

## 2023-07-09 NOTE — Progress Notes (Signed)
   06/15/2023 1300  Spiritual Encounters  Type of Visit Initial  Care provided to: [facility] present during encounter Nurse  Referral source Nurse (RN/NT/LPN)  Reason for visit Patient death  OnCall Visit No  Spiritual Framework  Presenting Themes Impactful experiences and emotions  Values/beliefs faith  Community/Connection Family  Family Stress Factors Loss  Interventions  Spiritual Care Interventions Made Compassionate presence;Reflective listening;Normalization of emotions;Meaning making  Intervention Outcomes  Outcomes Awareness of support   Ch responded to request for emotional and spiritual support. Pt's daughters were at bedside. They were not expecting their mother to die. The even caught them by surprise. They are a small and close knit family. Pt will be miss by everyone. Ch helped family process their thoughts and feelings and helped them feel comforted. Ch remains available if needed.

## 2023-07-09 NOTE — Progress Notes (Signed)
Notified Dr Waymon Amato of BP 107/38, map 58, asked to hold BP meds.

## 2023-07-09 DEATH — deceased
